# Patient Record
Sex: Female | Born: 1955 | Race: White | Hispanic: No | Smoking: Former smoker
Health system: Southern US, Community
[De-identification: ages and names within clinical notes are randomized; demographics above are authoritative.]

## PROBLEM LIST (undated history)

## (undated) DIAGNOSIS — H547 Unspecified visual loss: Secondary | ICD-10-CM

## (undated) DIAGNOSIS — H9190 Unspecified hearing loss, unspecified ear: Secondary | ICD-10-CM

## (undated) HISTORY — DX: Unspecified visual loss: H54.7

## (undated) HISTORY — DX: Unspecified hearing loss, unspecified ear: H91.90

## (undated) HISTORY — PX: COSMETIC SURGERY: SHX468

## (undated) HISTORY — PX: EYE SURGERY: SHX253

## (undated) HISTORY — PX: TUBAL LIGATION: SHX77

## (undated) HISTORY — PX: TONSILLECTOMY: SUR1361

---
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
## 1984-05-06 HISTORY — PX: ABDOMINAL HYSTERECTOMY: SHX81

## 1997-08-16 ENCOUNTER — Other Ambulatory Visit: Admission: RE | Admit: 1997-08-16 | Discharge: 1997-08-16 | Payer: Self-pay | Admitting: Family Medicine

## 1997-08-23 ENCOUNTER — Other Ambulatory Visit: Admission: RE | Admit: 1997-08-23 | Discharge: 1997-08-23 | Payer: Self-pay | Admitting: Family Medicine

## 2000-09-11 ENCOUNTER — Encounter: Admission: RE | Admit: 2000-09-11 | Discharge: 2000-09-11 | Payer: Self-pay | Admitting: Family Medicine

## 2000-09-11 ENCOUNTER — Encounter: Payer: Self-pay | Admitting: Family Medicine

## 2000-09-24 ENCOUNTER — Encounter: Admission: RE | Admit: 2000-09-24 | Discharge: 2000-09-24 | Payer: Self-pay | Admitting: Gastroenterology

## 2000-09-24 ENCOUNTER — Encounter: Payer: Self-pay | Admitting: Gastroenterology

## 2000-09-25 ENCOUNTER — Inpatient Hospital Stay (HOSPITAL_COMMUNITY): Admission: EM | Admit: 2000-09-25 | Discharge: 2000-10-03 | Payer: Self-pay | Admitting: Gastroenterology

## 2000-09-28 ENCOUNTER — Encounter: Payer: Self-pay | Admitting: Internal Medicine

## 2000-09-30 ENCOUNTER — Encounter: Payer: Self-pay | Admitting: Internal Medicine

## 2000-10-01 ENCOUNTER — Encounter: Payer: Self-pay | Admitting: Internal Medicine

## 2000-10-03 ENCOUNTER — Encounter: Payer: Self-pay | Admitting: Internal Medicine

## 2000-10-30 ENCOUNTER — Other Ambulatory Visit: Admission: RE | Admit: 2000-10-30 | Discharge: 2000-10-30 | Payer: Self-pay | Admitting: Gastroenterology

## 2000-11-24 ENCOUNTER — Encounter: Payer: Self-pay | Admitting: Gastroenterology

## 2000-11-24 ENCOUNTER — Ambulatory Visit (HOSPITAL_COMMUNITY): Admission: RE | Admit: 2000-11-24 | Discharge: 2000-11-24 | Payer: Self-pay | Admitting: Gastroenterology

## 2010-02-01 LAB — HM COLONOSCOPY

## 2011-10-30 DIAGNOSIS — K219 Gastro-esophageal reflux disease without esophagitis: Secondary | ICD-10-CM | POA: Diagnosis not present

## 2011-10-30 DIAGNOSIS — H543 Unqualified visual loss, both eyes: Secondary | ICD-10-CM | POA: Diagnosis not present

## 2011-10-30 DIAGNOSIS — H913 Deaf nonspeaking, not elsewhere classified: Secondary | ICD-10-CM | POA: Diagnosis not present

## 2011-10-30 DIAGNOSIS — Z0000 Encounter for general adult medical examination without abnormal findings: Secondary | ICD-10-CM | POA: Diagnosis not present

## 2011-11-11 DIAGNOSIS — Z1231 Encounter for screening mammogram for malignant neoplasm of breast: Secondary | ICD-10-CM | POA: Diagnosis not present

## 2011-11-11 DIAGNOSIS — R5381 Other malaise: Secondary | ICD-10-CM | POA: Diagnosis not present

## 2011-11-11 DIAGNOSIS — Z79899 Other long term (current) drug therapy: Secondary | ICD-10-CM | POA: Diagnosis not present

## 2011-11-11 DIAGNOSIS — H543 Unqualified visual loss, both eyes: Secondary | ICD-10-CM | POA: Diagnosis not present

## 2011-11-11 DIAGNOSIS — K219 Gastro-esophageal reflux disease without esophagitis: Secondary | ICD-10-CM | POA: Diagnosis not present

## 2011-11-11 DIAGNOSIS — R5383 Other fatigue: Secondary | ICD-10-CM | POA: Diagnosis not present

## 2011-11-11 LAB — TSH: TSH: 2.7 u[IU]/mL (ref 0.41–5.90)

## 2011-11-11 LAB — BASIC METABOLIC PANEL
BUN: 11 mg/dL (ref 4–21)
Creatinine: 0.7 mg/dL (ref 0.5–1.1)
Glucose: 80 mg/dL
Potassium: 4.7 mmol/L (ref 3.4–5.3)
Sodium: 140 mmol/L (ref 137–147)

## 2011-11-11 LAB — CBC AND DIFFERENTIAL
HCT: 36 % (ref 36–46)
Hemoglobin: 12.1 g/dL (ref 12.0–16.0)
Platelets: 272 10*3/uL (ref 150–399)
WBC: 4.6 10^3/mL

## 2011-11-11 LAB — HM MAMMOGRAPHY
HM Mammogram: NORMAL
HM Mammogram: NORMAL

## 2011-11-11 LAB — LIPID PANEL
Cholesterol: 197 mg/dL (ref 0–200)
HDL: 74 mg/dL — AB (ref 35–70)
LDL Cholesterol: 110 mg/dL
Triglycerides: 67 mg/dL (ref 40–160)

## 2011-11-11 LAB — HEPATIC FUNCTION PANEL
ALT: 19 U/L (ref 7–35)
AST: 34 U/L (ref 13–35)
Alkaline Phosphatase: 77 U/L (ref 25–125)
Bilirubin, Total: 0.4 mg/dL

## 2011-12-05 DIAGNOSIS — H543 Unqualified visual loss, both eyes: Secondary | ICD-10-CM | POA: Diagnosis not present

## 2011-12-05 DIAGNOSIS — K219 Gastro-esophageal reflux disease without esophagitis: Secondary | ICD-10-CM | POA: Diagnosis not present

## 2011-12-05 DIAGNOSIS — H913 Deaf nonspeaking, not elsewhere classified: Secondary | ICD-10-CM | POA: Diagnosis not present

## 2012-02-09 DIAGNOSIS — Z23 Encounter for immunization: Secondary | ICD-10-CM | POA: Diagnosis not present

## 2012-03-23 DIAGNOSIS — M79609 Pain in unspecified limb: Secondary | ICD-10-CM | POA: Diagnosis not present

## 2012-07-17 DIAGNOSIS — H3552 Pigmentary retinal dystrophy: Secondary | ICD-10-CM | POA: Diagnosis not present

## 2013-02-01 ENCOUNTER — Ambulatory Visit (INDEPENDENT_AMBULATORY_CARE_PROVIDER_SITE_OTHER): Payer: Medicare Other | Admitting: Internal Medicine

## 2013-02-01 ENCOUNTER — Encounter: Payer: Self-pay | Admitting: Internal Medicine

## 2013-02-01 VITALS — BP 118/70 | HR 72 | Temp 98.0°F | Resp 14 | Ht 63.0 in | Wt 164.5 lb

## 2013-02-01 DIAGNOSIS — K5901 Slow transit constipation: Secondary | ICD-10-CM

## 2013-02-01 DIAGNOSIS — Z1239 Encounter for other screening for malignant neoplasm of breast: Secondary | ICD-10-CM | POA: Diagnosis not present

## 2013-02-01 DIAGNOSIS — N760 Acute vaginitis: Secondary | ICD-10-CM

## 2013-02-01 DIAGNOSIS — E663 Overweight: Secondary | ICD-10-CM | POA: Diagnosis not present

## 2013-02-01 DIAGNOSIS — Z23 Encounter for immunization: Secondary | ICD-10-CM | POA: Diagnosis not present

## 2013-02-01 DIAGNOSIS — Z6825 Body mass index (BMI) 25.0-25.9, adult: Secondary | ICD-10-CM

## 2013-02-01 DIAGNOSIS — H9190 Unspecified hearing loss, unspecified ear: Secondary | ICD-10-CM

## 2013-02-01 DIAGNOSIS — H3552 Pigmentary retinal dystrophy: Secondary | ICD-10-CM | POA: Diagnosis not present
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
## 2013-02-01 NOTE — Patient Instructions (Addendum)
Please make an appt for fasting labs at your convenience  You received the flu and Tdap vaccines today ' We will set up your mammogram at [facility] today

## 2013-02-01 NOTE — Assessment & Plan Note (Signed)
Exam was normal.  Area of itching  Appeared hypopigimented and macular, may be a sebaceous cyst, not inflamed.

## 2013-02-01 NOTE — Assessment & Plan Note (Addendum)
Managed previously with miralax, now with a high fiber diet. Last colonoscopy may have been in 2011.

## 2013-02-01 NOTE — Assessment & Plan Note (Signed)
I have addressed  BMI and recommended wt loss of 10% of body weigh over the next 6 months using a low glycemic index diet and regular exercise a minimum of 5 days per week.   She will return for fasting labs in December

## 2013-02-01 NOTE — Assessment & Plan Note (Signed)
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  Uses sign language for communication

## 2013-02-05 ENCOUNTER — Telehealth: Payer: Self-pay | Admitting: Internal Medicine

## 2013-02-05 NOTE — Telephone Encounter (Signed)
Called Aurea Graff (mother) to find out pt previous primary care/where do we need to get previous records from?  Med Rec request received but w/o destination.

## 2013-04-13 ENCOUNTER — Ambulatory Visit: Payer: Self-pay | Admitting: Internal Medicine

## 2013-04-13 DIAGNOSIS — Z1231 Encounter for screening mammogram for malignant neoplasm of breast: Secondary | ICD-10-CM | POA: Diagnosis not present

## 2013-04-16 ENCOUNTER — Other Ambulatory Visit: Payer: Medicare Other

## 2013-04-16 ENCOUNTER — Telehealth: Payer: Self-pay | Admitting: *Deleted

## 2013-04-16 NOTE — Telephone Encounter (Signed)
Pt is coming in for labs 12.15.2014 what labs and dx?  

## 2013-04-19 ENCOUNTER — Telehealth: Payer: Self-pay | Admitting: *Deleted

## 2013-04-19 ENCOUNTER — Other Ambulatory Visit (INDEPENDENT_AMBULATORY_CARE_PROVIDER_SITE_OTHER): Payer: Medicare Other

## 2013-04-19 DIAGNOSIS — R5381 Other malaise: Secondary | ICD-10-CM | POA: Diagnosis not present

## 2013-04-19 DIAGNOSIS — E785 Hyperlipidemia, unspecified: Secondary | ICD-10-CM

## 2013-04-19 LAB — LIPID PANEL
HDL: 66.3 mg/dL (ref >39.00)
Total CHOL/HDL Ratio: 3
Triglycerides: 66 mg/dL (ref 0.0–149.0)
VLDL: 13.2 mg/dL (ref 0.0–40.0)

## 2013-04-19 LAB — COMPREHENSIVE METABOLIC PANEL
ALT: 16 U/L (ref 0–35)
Albumin: 4.3 g/dL (ref 3.5–5.2)
Alkaline Phosphatase: 74 U/L (ref 39–117)
Calcium: 9.6 mg/dL (ref 8.4–10.5)
Chloride: 105 mEq/L (ref 96–112)
GFR: 88.36 mL/min (ref >60.00)
Potassium: 4.8 mEq/L (ref 3.5–5.1)
Sodium: 142 mEq/L (ref 135–145)
Total Bilirubin: 0.7 mg/dL (ref 0.3–1.2)
Total Protein: 7.5 g/dL (ref 6.0–8.3)

## 2013-04-19 LAB — LDL CHOLESTEROL, DIRECT: Direct LDL: 128.1 mg/dL

## 2013-04-19 LAB — TSH: TSH: 5.13 u[IU]/mL (ref 0.35–5.50)

## 2013-04-19 NOTE — Telephone Encounter (Signed)
What labs and dx?  

## 2013-04-20 ENCOUNTER — Encounter: Payer: Self-pay | Admitting: *Deleted

## 2013-05-21 ENCOUNTER — Encounter: Payer: Self-pay | Admitting: Internal Medicine

## 2013-10-21 DIAGNOSIS — L280 Lichen simplex chronicus: Secondary | ICD-10-CM | POA: Diagnosis not present

## 2013-10-21 DIAGNOSIS — D485 Neoplasm of uncertain behavior of skin: Secondary | ICD-10-CM | POA: Diagnosis not present

## 2013-10-21 DIAGNOSIS — L820 Inflamed seborrheic keratosis: Secondary | ICD-10-CM | POA: Diagnosis not present

## 2013-10-25 ENCOUNTER — Encounter: Payer: Self-pay | Admitting: Internal Medicine

## 2013-10-25 ENCOUNTER — Ambulatory Visit (INDEPENDENT_AMBULATORY_CARE_PROVIDER_SITE_OTHER): Payer: Medicare Other | Admitting: Internal Medicine

## 2013-10-25 VITALS — BP 128/72 | HR 77 | Temp 97.7°F | Resp 16 | Ht 63.0 in | Wt 162.8 lb

## 2013-10-25 DIAGNOSIS — E663 Overweight: Secondary | ICD-10-CM | POA: Diagnosis not present

## 2013-10-25 DIAGNOSIS — K5901 Slow transit constipation: Secondary | ICD-10-CM

## 2013-10-25 DIAGNOSIS — G4762 Sleep related leg cramps: Secondary | ICD-10-CM | POA: Diagnosis not present

## 2013-10-25 DIAGNOSIS — E038 Other specified hypothyroidism: Secondary | ICD-10-CM | POA: Diagnosis not present

## 2013-10-25 DIAGNOSIS — E039 Hypothyroidism, unspecified: Secondary | ICD-10-CM

## 2013-10-25 LAB — BASIC METABOLIC PANEL
BUN: 13 mg/dL (ref 6–23)
CALCIUM: 9.3 mg/dL (ref 8.4–10.5)
CO2: 32 mEq/L (ref 19–32)
CREATININE: 0.8 mg/dL (ref 0.4–1.2)
Chloride: 103 mEq/L (ref 96–112)
GFR: 79.24 mL/min (ref >60.00)
Glucose, Bld: 82 mg/dL (ref 70–99)
Potassium: 4.3 mEq/L (ref 3.5–5.1)
SODIUM: 139 meq/L (ref 135–145)

## 2013-10-25 LAB — MAGNESIUM: Magnesium: 2.2 mg/dL (ref 1.5–2.5)

## 2013-10-25 NOTE — Patient Instructions (Signed)
Leg Cramps Leg cramps that occur during exercise can be caused by poor circulation or dehydration. However, muscle cramps that occur at rest or during the night are usually not due to any serious medical problem. Heat cramps may cause muscle spasms during hot weather.  CAUSES There is no clear cause for muscle cramps. However, dehydration may be a factor for those who do not drink enough fluids and those who exercise in the heat. Imbalances in the level of sodium, potassium, calcium or magnesium in the muscle tissue may also be a factor. Some medications, such as water pills (diuretics), may cause loss of chemicals that the body needs (like sodium and potassium) and cause muscle cramps. TREATMENT   Make sure your diet has enough fluids and essential minerals for the muscle to work normally.  Avoid strenuous exercise for several days if you have been having frequent leg cramps.  Stretch and massage the cramped muscle for several minutes.  Some medicines may be helpful in some patients with night cramps. Only take over-the-counter or prescription medicines as directed by your caregiver. SEEK IMMEDIATE MEDICAL CARE IF:   Your leg cramps become worse.  Your foot becomes cold, numb, or blue. Document Released: 05/30/2004 Document Revised: 07/15/2011 Document Reviewed: 05/17/2008 ExitCare Patient Information 2015 [employer]. This information is not intended to replace advice given to you by your health care Karem Tomaso. Make sure you discuss any questions you have with your health care [place].  

## 2013-10-25 NOTE — Progress Notes (Signed)
Patient ID: Helen Mccoy, female   DOB: July 05, 1955, 58 y.o.   MRN: [id number]  Patient Active Problem List   Diagnosis Date Noted  . Nocturnal leg cramps 10/26/2013  . 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 Overweight (BMI 25.0-29.9) 02/01/2013  . Constipation, slow transit 02/01/2013    Subjective:  CC:   Chief Complaint  Patient presents with  . Follow-up    # month on thyroid    HPI:   DAILY DOE is a 58 y.o. female who presents for follow up on borderline abnormal thyroid function noted during December evaluation   Patient is deaf/mute/blind secondary to Usher's syndrome..  Accompanied by mother   Cc: 1)  has been having nocturnal leg and foot cramps., including toes.  Does not wear high heels,  Take siuretics, or work outside,  Sedentary lifestyle due to disability.  2)  Chronic constipation managed with fiber supplememnt     Past Medical History  Diagnosis Date  . Blind     retinitis pigmentosia started at 58 years of age  . Deafness 9 /24/1956    bilateral    Past Surgical History  Procedure Laterality Date  . Tonsilectomy, adenoidectomy, bilateral myringotomy and tubes Bilateral 1968  . Abdominal hysterectomy  1986    nonmalignant reasons  . Cesarean section    . Cosmetic surgery    . Tubal ligation         The following portions of the patient's history were reviewed and updated as appropriate: Allergies, current medications, and problem list.    Review of Systems:   Patient denies headache, fevers, malaise, unintentional weight loss, skin rash, eye pain, sinus congestion and sinus pain, sore throat, dysphagia,  hemoptysis , cough, dyspnea, wheezing, chest pain, palpitations, orthopnea, edema, abdominal pain, nausea, melena, diarrhea, constipation, flank pain, dysuria, hematuria, urinary  Frequency, nocturia, numbness, tingling, seizures,  Focal weakness, Loss of consciousness,  Tremor, insomnia, depression, anxiety, and suicidal ideation.      History   Social History  . Marital Status: Divorced    Spouse Name: N/A    Number of Children: N/A  . Years of Education: N/A   Occupational History  . Not on file.   Social History Main Topics  . Smoking status: Former Smoker -- 1.00 packs/day    Types: Cigarettes    Quit date: 02/02/1999  . Smokeless tobacco: Not on file  . Alcohol Use: Yes     Comment: occasionally  . Drug Use: No  . Sexual Activity: Not Currently   Other Topics Concern  . Not on file   Social History Narrative  . No narrative on file    Objective:  Filed Vitals:   10/25/13 1106  BP: 128/72  Pulse: 77  Temp: 97.7 F (36.5 C)  Resp: 16     General appearance: alert, cooperative and appears stated age Ears: normal TM's and external ear canals both ears Throat: lips, mucosa, and tongue normal; teeth and gums normal Neck: no adenopathy, no carotid bruit, supple, symmetrical, trachea midline and thyroid not enlarged, symmetric, no tenderness/mass/nodules Back: symmetric, no curvature. ROM normal. No CVA tenderness. Lungs: clear to auscultation bilaterally Heart: regular rate and rhythm, S1, S2 normal, no murmur, click, rub or gallop Abdomen: soft, non-tender; bowel sounds normal; no masses,  no organomegaly Pulses: 2+ and symmetric Skin: Skin color, texture, turgor normal. No rashes or lesions Lymph nodes: Cervical, supraclavicular, and axillary nodes normal.  Assessment and Plan:  Constipation, slow transit Rechecking thyroid today given  borderline TSH in the past. TSH  is normal.  Advised to continue daily fiber laxative,  Confirm adequate fiver and water in diet.  Results of 2011 colonoscopy requested.  Lab Results  Component Value Date   TSH 2.130 10/25/2013     Nocturnal leg cramps Normal electrolyte screen,  Normal vascular exam.  Recommended stretching exercises and soaks in epsom salts.   Lab Results  Component Value Date   NA 139 10/25/2013   K 4.3 10/25/2013   CL 103  10/25/2013   CO2 32 10/25/2013     Overweight (BMI 25.0-29.9) I have addressed  BMI and recommended wt loss of 10% of body weigh over the next 6 months using a low glycemic index diet and regular exercise a minimum of 5 days per week. Thyroid function is normal,  No signs of diabetes    Updated Medication List No outpatient encounter prescriptions on file as of 10/25/2013.     Orders Placed This Encounter  Procedures  . T4 AND TSH  . Basic metabolic panel  . Magnesium    No Follow-up on file.

## 2013-10-25 NOTE — Progress Notes (Signed)
Pre-visit discussion using our clinic review tool. No additional management support is needed unless otherwise documented below in the visit note.  

## 2013-10-26 DIAGNOSIS — G4762 Sleep related leg cramps: Secondary | ICD-10-CM | POA: Insufficient documentation

## 2013-10-26 LAB — T4 AND TSH
T4 TOTAL: 7.6 ug/dL (ref 4.5–12.0)
TSH: 2.13 u[IU]/mL (ref 0.450–4.500)

## 2013-10-26 NOTE — Assessment & Plan Note (Addendum)
I have addressed  BMI and recommended wt loss of 10% of body weigh over the next 6 months using a low glycemic index diet and regular exercise a minimum of 5 days per week. Thyroid function is normal,  No signs of diabetes

## 2013-10-26 NOTE — Assessment & Plan Note (Signed)
Rechecking thyroid today given borderline TSH in the past. TSH  is normal.  Advised to continue daily fiber laxative,  Confirm adequate fiver and water in diet.  Results of 2011 colonoscopy requested.  Lab Results  Component Value Date   TSH 2.130 10/25/2013

## 2013-10-26 NOTE — Assessment & Plan Note (Signed)
Normal electrolyte screen,  Normal vascular exam.  Recommended stretching exercises and soaks in epsom salts.   Lab Results  Component Value Date   NA 139 10/25/2013   K 4.3 10/25/2013   CL 103 10/25/2013   CO2 32 10/25/2013

## 2013-10-28 ENCOUNTER — Encounter: Payer: Self-pay | Admitting: *Deleted

## 2014-01-13 ENCOUNTER — Other Ambulatory Visit: Payer: Self-pay | Admitting: Internal Medicine

## 2014-02-12 DIAGNOSIS — Z23 Encounter for immunization: Secondary | ICD-10-CM | POA: Diagnosis not present

## 2015-02-16 DIAGNOSIS — J069 Acute upper respiratory infection, unspecified: Secondary | ICD-10-CM | POA: Diagnosis not present

## 2015-02-16 DIAGNOSIS — R591 Generalized enlarged lymph nodes: Secondary | ICD-10-CM | POA: Diagnosis not present

## 2015-02-16 DIAGNOSIS — Z882 Allergy status to sulfonamides status: Secondary | ICD-10-CM | POA: Diagnosis not present

## 2015-02-16 DIAGNOSIS — H9190 Unspecified hearing loss, unspecified ear: Secondary | ICD-10-CM | POA: Diagnosis not present

## 2015-02-16 DIAGNOSIS — H540 Blindness, both eyes: Secondary | ICD-10-CM | POA: Diagnosis not present

## 2015-03-02 DIAGNOSIS — Z23 Encounter for immunization: Secondary | ICD-10-CM | POA: Diagnosis not present

## 2015-04-28 ENCOUNTER — Ambulatory Visit
Admission: RE | Admit: 2015-04-28 | Discharge: 2015-04-28 | Disposition: A | Discharge status: Home / Self Care | Payer: Medicare Other | Source: Ambulatory Visit | Attending: Internal Medicine | Admitting: Internal Medicine

## 2015-04-28 ENCOUNTER — Ambulatory Visit (INDEPENDENT_AMBULATORY_CARE_PROVIDER_SITE_OTHER): Payer: Medicare Other | Admitting: Internal Medicine

## 2015-04-28 ENCOUNTER — Encounter: Payer: Self-pay | Admitting: Internal Medicine

## 2015-04-28 VITALS — BP 108/68 | HR 70 | Temp 98.1°F | Resp 12 | Ht 63.0 in | Wt 158.1 lb

## 2015-04-28 DIAGNOSIS — Z113 Encounter for screening for infections with a predominantly sexual mode of transmission: Secondary | ICD-10-CM | POA: Diagnosis not present

## 2015-04-28 DIAGNOSIS — E559 Vitamin D deficiency, unspecified: Secondary | ICD-10-CM

## 2015-04-28 DIAGNOSIS — E663 Overweight: Secondary | ICD-10-CM | POA: Diagnosis not present

## 2015-04-28 DIAGNOSIS — R591 Generalized enlarged lymph nodes: Secondary | ICD-10-CM

## 2015-04-28 DIAGNOSIS — R5383 Other fatigue: Secondary | ICD-10-CM

## 2015-04-28 DIAGNOSIS — Z0000 Encounter for general adult medical examination without abnormal findings: Secondary | ICD-10-CM

## 2015-04-28 DIAGNOSIS — D649 Anemia, unspecified: Secondary | ICD-10-CM

## 2015-04-28 DIAGNOSIS — Z90710 Acquired absence of both cervix and uterus: Secondary | ICD-10-CM | POA: Diagnosis not present

## 2015-04-28 DIAGNOSIS — R599 Enlarged lymph nodes, unspecified: Secondary | ICD-10-CM

## 2015-04-28 DIAGNOSIS — E785 Hyperlipidemia, unspecified: Secondary | ICD-10-CM

## 2015-04-28 DIAGNOSIS — Z1239 Encounter for other screening for malignant neoplasm of breast: Secondary | ICD-10-CM

## 2015-04-28 DIAGNOSIS — R61 Generalized hyperhidrosis: Secondary | ICD-10-CM | POA: Diagnosis not present

## 2015-04-28 LAB — C-REACTIVE PROTEIN: CRP: 0.4 mg/dL — AB (ref 0.5–20.0)

## 2015-04-28 LAB — VITAMIN B12: Vitamin B-12: 352 pg/mL (ref 211–911)

## 2015-04-28 LAB — LIPID PANEL
CHOL/HDL RATIO: 3
Cholesterol: 219 mg/dL — ABNORMAL HIGH (ref 0–200)
HDL: 76.9 mg/dL (ref >39.00)
LDL Cholesterol: 130 mg/dL — ABNORMAL HIGH (ref 0–99)
NONHDL: 141.76
TRIGLYCERIDES: 59 mg/dL (ref 0.0–149.0)
VLDL: 11.8 mg/dL (ref 0.0–40.0)

## 2015-04-28 LAB — CBC WITH DIFFERENTIAL/PLATELET
BASOS ABS: 0 10*3/uL (ref 0.0–0.1)
BASOS PCT: 0.5 % (ref 0.0–3.0)
EOS ABS: 0.1 10*3/uL (ref 0.0–0.7)
Eosinophils Relative: 1.7 % (ref 0.0–5.0)
HEMATOCRIT: 38.7 % (ref 36.0–46.0)
Hemoglobin: 12.8 g/dL (ref 12.0–15.0)
LYMPHS ABS: 2.3 10*3/uL (ref 0.7–4.0)
LYMPHS PCT: 37.7 % (ref 12.0–46.0)
MCHC: 32.9 g/dL (ref 30.0–36.0)
MCV: 92.6 fl (ref 78.0–100.0)
Monocytes Absolute: 0.6 10*3/uL (ref 0.1–1.0)
Monocytes Relative: 9.7 % (ref 3.0–12.0)
NEUTROS ABS: 3.1 10*3/uL (ref 1.4–7.7)
NEUTROS PCT: 50.4 % (ref 43.0–77.0)
PLATELETS: 313 10*3/uL (ref 150.0–400.0)
RBC: 4.18 Mil/uL (ref 3.87–5.11)
RDW: 13 % (ref 11.5–15.5)
WBC: 6.1 10*3/uL (ref 4.0–10.5)

## 2015-04-28 LAB — COMPREHENSIVE METABOLIC PANEL
ALK PHOS: 83 U/L (ref 39–117)
ALT: 11 U/L (ref 0–35)
AST: 24 U/L (ref 0–37)
Albumin: 4.3 g/dL (ref 3.5–5.2)
BILIRUBIN TOTAL: 0.5 mg/dL (ref 0.2–1.2)
BUN: 13 mg/dL (ref 6–23)
CALCIUM: 9.8 mg/dL (ref 8.4–10.5)
CO2: 31 mEq/L (ref 19–32)
CREATININE: 0.71 mg/dL (ref 0.40–1.20)
Chloride: 104 mEq/L (ref 96–112)
GFR: 89.17 mL/min (ref >60.00)
Glucose, Bld: 88 mg/dL (ref 70–99)
Potassium: 4.4 mEq/L (ref 3.5–5.1)
SODIUM: 142 meq/L (ref 135–145)
TOTAL PROTEIN: 7.7 g/dL (ref 6.0–8.3)

## 2015-04-28 LAB — TSH: TSH: 1.52 u[IU]/mL (ref 0.35–4.50)

## 2015-04-28 LAB — IBC PANEL
IRON: 115 ug/dL (ref 42–145)
Saturation Ratios: 31.6 % (ref 20.0–50.0)
TRANSFERRIN: 260 mg/dL (ref 212.0–360.0)

## 2015-04-28 LAB — FERRITIN: Ferritin: 105.6 ng/mL (ref 10.0–291.0)

## 2015-04-28 LAB — LDL CHOLESTEROL, DIRECT: LDL DIRECT: 125 mg/dL

## 2015-04-28 LAB — VITAMIN D 25 HYDROXY (VIT D DEFICIENCY, FRACTURES): VITD: 20.48 ng/mL — ABNORMAL LOW (ref 30.00–100.00)

## 2015-04-28 NOTE — Progress Notes (Signed)
Pre-visit discussion using our clinic review tool. No additional management support is needed unless otherwise documented below in the visit note.  

## 2015-04-28 NOTE — Progress Notes (Signed)
Patient ID: Helen Mccoy, female    DOB: May 09, 1955  Age: 59 y.o. MRN: [id number]  The patient is here for annual  wellness examination and management of other chronic and acute problems. She is accomapneid by her mother and an interpretor as she is deaf and blind secondary  to Usher syndrome   The risk factors are reflected in the social history.  The roster of all physicians providing medical care to patient - is listed in the Snapshot section of the chart.  Activities of daily living:  The patient is 100% independent in all ADLs: dressing, toileting, feeding as well as independent mobility  Home safety : The patient has smoke detectors in the home. They wear seatbelts.  There are no firearms at home. There is no violence in the home.   There is no risks for hepatitis, STDs or HIV. There is no   history of blood transfusion. They have no travel history to infectious disease endemic areas of the world.  The patient has seen their dentist in the last six month. They have seen their eye doctor in the last year. They admit to slight hearing difficulty with regard to whispered voices and some television programs.  They have deferred audiologic testing in the last year.  They do not  have excessive sun exposure. Discussed the need for sun protection: hats, long sleeves and use of sunscreen if there is significant sun exposure.   Diet: the importance of a healthy diet is discussed. They do have a healthy diet.  The benefits of regular aerobic exercise were discussed. She does not exercise regularly but occasionally uses a treadmill or leg lifts, and is  willing to do what is recommended   Depression screen: there are no signs or vegative symptoms of depression- irritability, change in appetite, anhedonia, sadness/tearfullness.  Cognitive assessment: the patient manages all their financial and personal affairs and is actively engaged. They could relate day,date,year and events; recalled 2/3  objects at 3 minutes; performed clock-face test normally.  The following portions of the patient's history were reviewed and updated as appropriate: allergies, current medications, past family history, past medical history,  past surgical history, past social history  and problem list.  Visual acuity was not assessed per patient preference since she has regular follow up with her ophthalmologist. Hearing and body mass index were assessed and reviewed.   During the course of the visit the patient was educated and counseled about appropriate screening and preventive services including : fall prevention , diabetes screening, nutrition counseling, colorectal cancer screening, and recommended immunizations.    CC: The primary encounter diagnosis was S/P TAH (total abdominal hysterectomy). Diagnoses of Anemia, unspecified anemia type, Screen for STD (sexually transmitted disease), Vitamin D deficiency, Other fatigue, Lymphadenopathy, Hyperlipidemia, Breast cancer screening, Visit for preventive health examination, Overweight (BMI 25.0-29.9), and Lymphadenopathy of head and neck were also pertinent to this visit.   LAD:  She was recently evaluated in ER in October for LAD,  Which was attributed to a viral syndrome.  She continues to have Right sided LAD neck, persistent   post nasal drip .  She reports thick mucus which is difficult to clear at times.  She denies sinus pain,  and headaches,  And denies fevers. No unintentional weight loss,  No cough.    Does not take any antihistamines.  sneezes several times at night once she lies down to sleep,  But not throughout the day.  Home is carpeted,  Mother admits  it has not had a deep cleaning in a long time and not sure when the ducts were last cleaned.    Menopausal:   also having hot flashes at night.  Has been intentionally losing weight   History of nocturnal cough has resolved with better eating habits.  Occasionally occurs at night   Worried about a  lump on her right shin that occurred after falling and bruising leg in early November    Concerned that she is developing rounded shoulders.  Discussed exercises to improve posture and tone shoulder and arm muscles   History Helen Mccoy has a past medical history of Blind and Deafness (9 /24/1956).   She has past surgical history that includes Tonsilectomy, adenoidectomy, bilateral myringotomy and tubes (Bilateral, 1968); Abdominal hysterectomy (1986); Cesarean section; Cosmetic surgery; and Tubal ligation.   Her family history includes Arthritis in her mother; Cancer in her mother; Hypertension in her father.She reports that she quit smoking about 16 years ago. Her smoking use included Cigarettes. She smoked 1.00 pack per day. She does not have any smokeless tobacco history on file. She reports that she drinks alcohol. She reports that she does not use illicit drugs.  No outpatient prescriptions prior to visit.   No facility-administered medications prior to visit.    Review of Systems   Patient denies headache, fevers, malaise, unintentional weight loss, skin rash, eye pain, sinus congestion and sinus pain, sore throat, dysphagia,  hemoptysis , cough, dyspnea, wheezing, chest pain, palpitations, orthopnea, edema, abdominal pain, nausea, melena, diarrhea, constipation, flank pain, dysuria, hematuria, urinary  Frequency, nocturia, numbness, tingling, seizures,  Focal weakness, Loss of consciousness,  Tremor, insomnia, depression, anxiety, and suicidal ideation.      Objective:  BP 108/68 mmHg  Pulse 70  Temp(Src) 98.1 F (36.7 C) (Oral)  Resp 12  Ht 5\' 3"  (1.6 m)  Wt 158 lb 2 oz (71.725 kg)  BMI 28.02 kg/m2  SpO2 99%  Physical Exam  General appearance: alert, cooperative and appears stated age Head: Normocephalic, without obvious abnormality, atraumatic Eyes: conjunctivae/corneas clear. PERRL, EOM's intact. Fundi benign. Ears: normal TM's and external ear canals both ears Nose:  Nares normal. Septum midline. Mucosa normal. No drainage or sinus tenderness. Throat: lips, mucosa, and tongue normal; teeth and gums normal Neck: no adenopathy, no carotid bruit, no JVD, supple, symmetrical, trachea midline and thyroid not enlarged, symmetric, no tenderness/mass/nodules Lungs: clear to auscultation bilaterally Breasts: normal appearance, no masses or tenderness Heart: regular rate and rhythm, S1, S2 normal, no murmur, click, rub or gallop Abdomen: soft, non-tender; bowel sounds normal; no masses,  no organomegaly Extremities: extremities normal, atraumatic, no cyanosis or edema Pulses: 2+ and symmetric Skin: Skin color, texture, turgor normal. No rashes or lesions Neurologic: Alert and oriented X 3, normal strength and tone. Normal symmetric reflexes. Normal coordination and gait.    Assessment & Plan:   Problem List Items Addressed This Visit    Overweight (BMI 25.0-29.9)    I have congratulated her in reduction of   BMI and encouraged  Continued weight loss with goal of 10% of body weigh over the next 6 months using a low glycemic index diet and regular exercise a minimum of 5 days per week.        Visit for preventive health examination    Annual comprehensive preventive exam was done as well as an evaluation and management of acute and chronic conditions .  During the course of the visit the patient was educated and counseled about  appropriate screening and preventive services including :  diabetes screening, lipid analysis with projected  10 year  risk for CAD , nutrition counseling, colorectal cancer screening, and recommended immunizations.  Printed recommendations for health maintenance screenings was given.   Lab Results  Component Value Date   CHOL 219* 04/28/2015   HDL 76.90 04/28/2015   LDLCALC 130* 04/28/2015   LDLDIRECT 125.0 04/28/2015   TRIG 59.0 04/28/2015   CHOLHDL 3 04/28/2015   Lab Results  Component Value Date   TSH 1.52 04/28/2015   Lab  Results  Component Value Date   CREATININE 0.71 04/28/2015   Lab Results  Component Value Date   ALT 11 04/28/2015   AST 24 04/28/2015   ALKPHOS 83 04/28/2015   BILITOT 0.5 04/28/2015   Lab Results  Component Value Date   NA 142 04/28/2015   K 4.4 04/28/2015   CL 104 04/28/2015   CO2 31 04/28/2015         Lymphadenopathy of head and neck    Right sided neck nodes are mildly enlarged, mobile and tender,  HEENT exam is normal but she is endorsing allergic symptoms vs viral.  She is very concerned that something has been missed.  Plain chest x ray and BC has been ordered and both are normal.   Lab Results  Component Value Date   WBC 6.1 04/28/2015   HGB 12.8 04/28/2015   HCT 38.7 04/28/2015   MCV 92.6 04/28/2015   PLT 313.0 04/28/2015         S/P TAH (total abdominal hysterectomy) - Primary    Other Visit Diagnoses    Anemia, unspecified anemia type        Relevant Orders    CBC with Differential/Platelet (Completed)    IBC panel (Completed)    Ferritin (Completed)    Vitamin B12 (Completed)    Screen for STD (sexually transmitted disease)        Relevant Orders    Hepatitis C antibody (Completed)    HIV antibody (Completed)    Vitamin D deficiency        Relevant Orders    VITAMIN D 25 Hydroxy (Vit-D Deficiency, Fractures) (Completed)    Other fatigue        Relevant Orders    Comprehensive metabolic panel (Completed)    TSH (Completed)    Hyperlipidemia        Relevant Orders    LDL cholesterol, direct (Completed)    Lipid panel (Completed)    Breast cancer screening        Relevant Orders    MM DIGITAL SCREENING BILATERAL      A total of 45 minutes was spent with patient more than half of which was spent in counseling patient on the above mentioned issues , reviewing and explaining recent labs and imaging studies done, and coordination of care.  Helen Mccoy does not currently have medications on file.  No orders of the defined types were placed in  this encounter.    There are no discontinued medications.  Follow-up: No Follow-up on file.   Crecencio Mc, MD

## 2015-04-28 NOTE — Patient Instructions (Addendum)
I recommend trying Benadryl at night to help the post nasal drip and the night time sneezing   Get your ducts evaluated to makes sure they are not full   I also recommend flushing sinuses in the morning with Salt water rinse to clear out the secretions  Would help  Chest x ray today  We will order the mammogram an you can set  It up  At [place] should start exercising 3 times a week on the treadmill or bike or both  (30 minutes nonstop ) and work your way up to 5 times per week  The shoulder exercises can be done with larger weights if tolerated and done every other day.   Menopause is a normal process in which your reproductive ability comes to an end. This process happens gradually over a span of months to years, usually between the ages of 29 and 67. Menopause is complete when you have missed 12 consecutive menstrual periods. It is important to talk with your health care provider about some of the most common conditions that affect postmenopausal women, such as heart disease, cancer, and bone loss (osteoporosis). Adopting a healthy lifestyle and getting preventive care can help to promote your health and wellness. Those actions can also lower your chances of developing some of these common conditions. WHAT SHOULD I KNOW ABOUT MENOPAUSE? During menopause, you may experience a number of symptoms, such as:  Moderate-to-severe hot flashes.  Night sweats.  Decrease in sex drive.  Mood swings.  Headaches.  Tiredness.  Irritability.  Memory problems.  Insomnia. Choosing to treat or not to treat menopausal changes is an individual decision that you make with your health care provider. WHAT SHOULD I KNOW ABOUT HORMONE REPLACEMENT THERAPY AND SUPPLEMENTS? Hormone therapy products are effective for treating symptoms that are associated with menopause, such as hot flashes and night sweats. Hormone replacement carries certain risks, especially as you become older. If you are thinking  about using estrogen or estrogen with progestin treatments, discuss the benefits and risks with your health care provider. WHAT SHOULD I KNOW ABOUT HEART DISEASE AND STROKE? Heart disease, heart attack, and stroke become more likely as you age. This may be due, in part, to the hormonal changes that your body experiences during menopause. These can affect how your body processes dietary fats, triglycerides, and cholesterol. Heart attack and stroke are both medical emergencies. There are many things that you can do to help prevent heart disease and stroke:  Have your blood pressure checked at least every 1-2 years. High blood pressure causes heart disease and increases the risk of stroke.  If you are 31-99 years old, ask your health care provider if you should take aspirin to prevent a heart attack or a stroke.  Do not use any tobacco products, including cigarettes, chewing tobacco, or electronic cigarettes. If you need help quitting, ask your health care provider.  It is important to eat a healthy diet and maintain a healthy weight.  Be sure to include plenty of vegetables, fruits, low-fat dairy products, and lean protein.  Avoid eating foods that are high in solid fats, added sugars, or salt (sodium).  Get regular exercise. This is one of the most important things that you can do for your health.  Try to exercise for at least 150 minutes each week. The type of exercise that you do should increase your heart rate and make you sweat. This is known as moderate-intensity exercise.  Try to do strengthening  exercises at least twice each week. Do these in addition to the moderate-intensity exercise.  Know your numbers.Ask your health care provider to check your cholesterol and your blood glucose. Continue to have your blood tested as directed by your health care provider. WHAT SHOULD I KNOW ABOUT CANCER SCREENING? There are several types of cancer. Take the following steps to reduce your risk and  to catch any cancer development as early as possible. Breast Cancer  Practice breast self-awareness.  This means understanding how your breasts normally appear and feel.  It also means doing regular breast self-exams. Let your health care provider know about any changes, no matter how small.  If you are 86 or older, have a clinician do a breast exam (clinical breast exam or CBE) every year. Depending on your age, family history, and medical history, it may be recommended that you also have a yearly breast X-ray (mammogram).  If you have a family history of breast cancer, talk with your health care provider about genetic screening.  If you are at high risk for breast cancer, talk with your health care provider about having an MRI and a mammogram every year.  Breast cancer (BRCA) gene test is recommended for women who have family members with BRCA-related cancers. Results of the assessment will determine the need for genetic counseling and BRCA1 and for BRCA2 testing. BRCA-related cancers include these types:  Breast. This occurs in males or females.  Ovarian.  Tubal. This may also be called fallopian tube cancer.  Cancer of the abdominal or pelvic lining (peritoneal cancer).  Prostate.  Pancreatic. Cervical, Uterine, and Ovarian Cancer Your health care provider may recommend that you be screened regularly for cancer of the pelvic organs. These include your ovaries, uterus, and vagina. This screening involves a pelvic exam, which includes checking for microscopic changes to the surface of your cervix (Pap test).  For women ages 21-65, health care providers may recommend a pelvic exam and a Pap test every three years. For women ages 61-65, they may recommend the Pap test and pelvic exam, combined with testing for human papilloma virus (HPV), every five years. Some types of HPV increase your risk of cervical cancer. Testing for HPV may also be done on women of any age who have unclear Pap  test results.  Other health care providers may not recommend any screening for nonpregnant women who are considered low risk for pelvic cancer and have no symptoms. Ask your health care provider if a screening pelvic exam is right for you.  If you have had past treatment for cervical cancer or a condition that could lead to cancer, you need Pap tests and screening for cancer for at least 20 years after your treatment. If Pap tests have been discontinued for you, your risk factors (such as having a new sexual partner) need to be reassessed to determine if you should start having screenings again. Some women have medical problems that increase the chance of getting cervical cancer. In these cases, your health care provider may recommend that you have screening and Pap tests more often.  If you have a family history of uterine cancer or ovarian cancer, talk with your health care provider about genetic screening.  If you have vaginal bleeding after reaching menopause, tell your health care provider.  There are currently no reliable tests available to screen for ovarian cancer. Lung Cancer Lung cancer screening is recommended for adults 42-69 years old who are at high risk for lung cancer because  of a history of smoking. A yearly low-dose CT scan of the lungs is recommended if you:  Currently smoke.  Have a history of at least 30 pack-years of smoking and you currently smoke or have quit within the past 15 years. A pack-year is smoking an average of one pack of cigarettes per day for one year. Yearly screening should:  Continue until it has been 15 years since you quit.  Stop if you develop a health problem that would prevent you from having lung cancer treatment. Colorectal Cancer  This type of cancer can be detected and can often be prevented.  Routine colorectal cancer screening usually begins at age 64 and continues through age 34.  If you have risk factors for colon cancer, your health  care provider may recommend that you be screened at an earlier age.  If you have a family history of colorectal cancer, talk with your health care provider about genetic screening.  Your health care provider may also recommend using home test kits to check for hidden blood in your stool.  A small camera at the end of a tube can be used to examine your colon directly (sigmoidoscopy or colonoscopy). This is done to check for the earliest forms of colorectal cancer.  Direct examination of the colon should be repeated every 5-10 years until age 25. However, if early forms of precancerous polyps or small growths are found or if you have a family history or genetic risk for colorectal cancer, you may need to be screened more often. Skin Cancer  Check your skin from head to toe regularly.  Monitor any moles. Be sure to tell your health care provider:  About any new moles or changes in moles, especially if there is a change in a mole's shape or color.  If you have a mole that is larger than the size of a pencil eraser.  If any of your family members has a history of skin cancer, especially at a young age, talk with your health care provider about genetic screening.  Always use sunscreen. Apply sunscreen liberally and repeatedly throughout the day.  Whenever you are outside, protect yourself by wearing long sleeves, pants, a wide-brimmed hat, and sunglasses. WHAT SHOULD I KNOW ABOUT OSTEOPOROSIS? Osteoporosis is a condition in which bone destruction happens more quickly than new bone creation. After menopause, you may be at an increased risk for osteoporosis. To help prevent osteoporosis or the bone fractures that can happen because of osteoporosis, the following is recommended:  If you are 35-10 years old, get at least 1,000 mg of calcium and at least 600 mg of vitamin D per day.  If you are older than age 77 but younger than age 33, get at least 1,200 mg of calcium and at least 600 mg of  vitamin D per day.  If you are older than age 23, get at least 1,200 mg of calcium and at least 800 mg of vitamin D per day. Smoking and excessive alcohol intake increase the risk of osteoporosis. Eat foods that are rich in calcium and vitamin D, and do weight-bearing exercises several times each week as directed by your health care provider. WHAT SHOULD I KNOW ABOUT HOW MENOPAUSE AFFECTS [place]? Depression may occur at any age, but it is more common as you become older. Common symptoms of depression include:  Low or sad mood.  Changes in sleep patterns.  Changes in appetite or eating patterns.  Feeling an overall lack of motivation  or enjoyment of activities that you previously enjoyed.  Frequent crying spells. Talk with your health care provider if you think that you are experiencing depression. WHAT SHOULD I KNOW ABOUT IMMUNIZATIONS? It is important that you get and maintain your immunizations. These include:  Tetanus, diphtheria, and pertussis (Tdap) booster vaccine.  Influenza every year before the flu season begins.  Pneumonia vaccine.  Shingles vaccine. Your health care provider may also recommend other immunizations.   This information is not intended to replace advice given to you by your health care provider. Make sure you discuss any questions you have with your health care provider.   Document Released: 06/14/2005 Document Revised: 05/13/2014 Document Reviewed: 12/23/2013 Elsevier Interactive Patient Education [employer].

## 2015-04-29 LAB — HEPATITIS C ANTIBODY: HCV Ab: NEGATIVE

## 2015-04-29 LAB — HIV ANTIBODY (ROUTINE TESTING W REFLEX): HIV: NONREACTIVE

## 2015-04-30 ENCOUNTER — Encounter: Payer: Self-pay | Admitting: Internal Medicine

## 2015-04-30 DIAGNOSIS — R591 Generalized enlarged lymph nodes: Secondary | ICD-10-CM | POA: Insufficient documentation

## 2015-04-30 DIAGNOSIS — Z0000 Encounter for general adult medical examination without abnormal findings: Secondary | ICD-10-CM | POA: Insufficient documentation

## 2015-04-30 NOTE — Assessment & Plan Note (Signed)
Right sided neck nodes are mildly enlarged, mobile and tender,  HEENT exam is normal but she is endorsing allergic symptoms vs viral.  She is very concerned that something has been missed.  Plain chest x ray and BC has been ordered and both are normal.   Lab Results  Component Value Date   WBC 6.1 04/28/2015   HGB 12.8 04/28/2015   HCT 38.7 04/28/2015   MCV 92.6 04/28/2015   PLT 313.0 04/28/2015

## 2015-04-30 NOTE — Assessment & Plan Note (Signed)
I have congratulated her in reduction of   BMI and encouraged  Continued weight loss with goal of 10% of body weigh over the next 6 months using a low glycemic index diet and regular exercise a minimum of 5 days per week.    

## 2015-04-30 NOTE — Assessment & Plan Note (Addendum)
Annual comprehensive preventive exam was done as well as an evaluation and management of acute and chronic conditions .  During the course of the visit the patient was educated and counseled about appropriate screening and preventive services including :  diabetes screening, lipid analysis with projected  10 year  risk for CAD , nutrition counseling, colorectal cancer screening, and recommended immunizations.  Printed recommendations for health maintenance screenings was given.   Lab Results  Component Value Date   CHOL 219* 04/28/2015   HDL 76.90 04/28/2015   LDLCALC 130* 04/28/2015   LDLDIRECT 125.0 04/28/2015   TRIG 59.0 04/28/2015   CHOLHDL 3 04/28/2015   Lab Results  Component Value Date   TSH 1.52 04/28/2015   Lab Results  Component Value Date   CREATININE 0.71 04/28/2015   Lab Results  Component Value Date   ALT 11 04/28/2015   AST 24 04/28/2015   ALKPHOS 83 04/28/2015   BILITOT 0.5 04/28/2015   Lab Results  Component Value Date   NA 142 04/28/2015   K 4.4 04/28/2015   CL 104 04/28/2015   CO2 31 04/28/2015

## 2015-05-01 ENCOUNTER — Other Ambulatory Visit: Payer: Self-pay | Admitting: Internal Medicine

## 2015-05-01 MED ORDER — ERGOCALCIFEROL 1.25 MG (50000 UT) PO CAPS: 50000.0000 [IU] | ORAL_CAPSULE | ORAL | Status: DC

## 2015-05-01 NOTE — Addendum Note (Signed)
Addended by: Crecencio Mc on: 05/01/2015 12:26 PM   Modules accepted: Miquel Dunn

## 2015-05-02 ENCOUNTER — Encounter: Payer: Self-pay | Admitting: *Deleted

## 2015-06-13 ENCOUNTER — Ambulatory Visit
Admission: RE | Admit: 2015-06-13 | Discharge: 2015-06-13 | Disposition: A | Discharge status: Home / Self Care | Payer: Medicare Other | Source: Ambulatory Visit | Attending: Internal Medicine | Admitting: Internal Medicine

## 2015-06-13 ENCOUNTER — Other Ambulatory Visit: Payer: Self-pay | Admitting: Internal Medicine

## 2015-06-13 DIAGNOSIS — Z1231 Encounter for screening mammogram for malignant neoplasm of breast: Secondary | ICD-10-CM

## 2015-06-13 DIAGNOSIS — Z1239 Encounter for other screening for malignant neoplasm of breast: Secondary | ICD-10-CM

## 2015-06-14 DIAGNOSIS — H3552 Pigmentary retinal dystrophy: Secondary | ICD-10-CM | POA: Diagnosis not present

## 2015-08-14 ENCOUNTER — Telehealth: Payer: Self-pay | Admitting: Internal Medicine

## 2015-08-14 NOTE — Telephone Encounter (Signed)
Pt's mother dropped off a renewal handicap application to have Dr. Derrel Nip Sign. Paper work is in Dr. Lupita Dawn box.

## 2015-08-15 NOTE — Telephone Encounter (Signed)
Notified patient mother form completed and ready for pick up.

## 2015-11-21 ENCOUNTER — Telehealth: Payer: Self-pay | Admitting: Internal Medicine

## 2015-11-21 NOTE — Telephone Encounter (Signed)
Please call patient's mother Remo Lipps to set up Hepatitis A/B vaccine on August 4th in preparation for a cruise to [place].  Patient is deaf and mute.

## 2015-11-22 NOTE — Telephone Encounter (Signed)
Attempted to call Remo Lipps again, no answer, not able to leave a message.

## 2015-11-22 NOTE — Telephone Encounter (Signed)
Attempted to reach Helen Mccoy on the number provided in the chart, no answer, kept ringing.  Called the patient's home and left a voice mail for them to return my call.

## 2016-02-22 DIAGNOSIS — Z23 Encounter for immunization: Secondary | ICD-10-CM | POA: Diagnosis not present

## 2016-03-06 ENCOUNTER — Telehealth: Payer: Self-pay | Admitting: Internal Medicine

## 2016-03-06 NOTE — Telephone Encounter (Signed)
I called pt and left a vm to call office and sch AWV. Thank you!

## 2016-05-01 ENCOUNTER — Ambulatory Visit (INDEPENDENT_AMBULATORY_CARE_PROVIDER_SITE_OTHER): Payer: Medicare Other

## 2016-05-01 ENCOUNTER — Encounter: Payer: Self-pay | Admitting: Internal Medicine

## 2016-05-01 ENCOUNTER — Ambulatory Visit (INDEPENDENT_AMBULATORY_CARE_PROVIDER_SITE_OTHER): Payer: Medicare Other | Admitting: Internal Medicine

## 2016-05-01 VITALS — BP 100/60 | HR 64 | Temp 98.1°F | Resp 16 | Ht 62.25 in | Wt 154.8 lb

## 2016-05-01 DIAGNOSIS — E559 Vitamin D deficiency, unspecified: Secondary | ICD-10-CM | POA: Diagnosis not present

## 2016-05-01 DIAGNOSIS — Z0000 Encounter for general adult medical examination without abnormal findings: Secondary | ICD-10-CM | POA: Diagnosis not present

## 2016-05-01 DIAGNOSIS — E7800 Pure hypercholesterolemia, unspecified: Secondary | ICD-10-CM | POA: Diagnosis not present

## 2016-05-01 DIAGNOSIS — R5383 Other fatigue: Secondary | ICD-10-CM | POA: Diagnosis not present

## 2016-05-01 DIAGNOSIS — R200 Anesthesia of skin: Secondary | ICD-10-CM | POA: Insufficient documentation

## 2016-05-01 DIAGNOSIS — Z1239 Encounter for other screening for malignant neoplasm of breast: Secondary | ICD-10-CM

## 2016-05-01 DIAGNOSIS — Z1231 Encounter for screening mammogram for malignant neoplasm of breast: Secondary | ICD-10-CM

## 2016-05-01 LAB — CBC WITH DIFFERENTIAL/PLATELET
BASOS PCT: 0.5 % (ref 0.0–3.0)
Basophils Absolute: 0 10*3/uL (ref 0.0–0.1)
EOS PCT: 2.1 % (ref 0.0–5.0)
Eosinophils Absolute: 0.1 10*3/uL (ref 0.0–0.7)
HEMATOCRIT: 36.3 % (ref 36.0–46.0)
Hemoglobin: 12.6 g/dL (ref 12.0–15.0)
LYMPHS PCT: 40.4 % (ref 12.0–46.0)
Lymphs Abs: 2.5 10*3/uL (ref 0.7–4.0)
MCHC: 34.6 g/dL (ref 30.0–36.0)
MCV: 91.1 fl (ref 78.0–100.0)
MONOS PCT: 8.3 % (ref 3.0–12.0)
Monocytes Absolute: 0.5 10*3/uL (ref 0.1–1.0)
NEUTROS ABS: 3 10*3/uL (ref 1.4–7.7)
Neutrophils Relative %: 48.7 % (ref 43.0–77.0)
PLATELETS: 311 10*3/uL (ref 150.0–400.0)
RBC: 3.98 Mil/uL (ref 3.87–5.11)
RDW: 13.2 % (ref 11.5–15.5)
WBC: 6.1 10*3/uL (ref 4.0–10.5)

## 2016-05-01 LAB — LIPID PANEL
CHOL/HDL RATIO: 3
Cholesterol: 219 mg/dL — ABNORMAL HIGH (ref 0–200)
HDL: 82.7 mg/dL (ref >39.00)
LDL Cholesterol: 122 mg/dL — ABNORMAL HIGH (ref 0–99)
NONHDL: 136.39
Triglycerides: 70 mg/dL (ref 0.0–149.0)
VLDL: 14 mg/dL (ref 0.0–40.0)

## 2016-05-01 LAB — TSH: TSH: 3.03 u[IU]/mL (ref 0.35–4.50)

## 2016-05-01 LAB — COMPREHENSIVE METABOLIC PANEL
ALT: 14 U/L (ref 0–35)
AST: 26 U/L (ref 0–37)
Albumin: 4.5 g/dL (ref 3.5–5.2)
Alkaline Phosphatase: 73 U/L (ref 39–117)
BUN: 13 mg/dL (ref 6–23)
CO2: 32 mEq/L (ref 19–32)
Calcium: 9.7 mg/dL (ref 8.4–10.5)
Chloride: 103 mEq/L (ref 96–112)
Creatinine, Ser: 0.67 mg/dL (ref 0.40–1.20)
GFR: 95.02 mL/min (ref >60.00)
Glucose, Bld: 86 mg/dL (ref 70–99)
Potassium: 4.7 mEq/L (ref 3.5–5.1)
Sodium: 142 mEq/L (ref 135–145)
Total Bilirubin: 0.5 mg/dL (ref 0.2–1.2)
Total Protein: 7.6 g/dL (ref 6.0–8.3)

## 2016-05-01 LAB — VITAMIN B12: Vitamin B-12: 377 pg/mL (ref 211–911)

## 2016-05-01 LAB — VITAMIN D 25 HYDROXY (VIT D DEFICIENCY, FRACTURES): VITD: 30.91 ng/mL (ref 30.00–100.00)

## 2016-05-01 NOTE — Assessment & Plan Note (Signed)
Annual comprehensive preventive exam was done as well as an evaluation and management of chronic conditions .  During the course of the visit the patient was educated and counseled about appropriate screening and preventive services including :  diabetes screening, lipid analysis with projected  10 year  risk for CAD , nutrition counseling, breast, cervical and colorectal cancer screening, and recommended immunizations.  Printed recommendations for health maintenance screenings was given 

## 2016-05-01 NOTE — Patient Instructions (Addendum)
Numbness in the hands can occur from Carpal Tunnel syndrome (median nerve entrapment in the wrist) or from degenerative changes in the cervical spine (neck area)  I am ordering plain films today of your  spine  To  see if degenerative changes are suggested.    Health Maintenance, Female Introduction Adopting a healthy lifestyle and getting preventive care can go a long way to promote health and wellness. Talk with your health care provider about what schedule of regular examinations is right for you. This is a good chance for you to check in with your provider about disease prevention and staying healthy. In between checkups, there are plenty of things you can do on your own. Experts have done a lot of research about which lifestyle changes and preventive measures are most likely to keep you healthy. Ask your health care provider for more information. Weight and diet Eat a healthy diet  Be sure to include plenty of vegetables, fruits, low-fat dairy products, and lean protein.  Do not eat a lot of foods high in solid fats, added sugars, or salt.  Get regular exercise. This is one of the most important things you can do for your health.  Most adults should exercise for at least 150 minutes each week. The exercise should increase your heart rate and make you sweat (moderate-intensity exercise).  Most adults should also do strengthening exercises at least twice a week. This is in addition to the moderate-intensity exercise. Maintain a healthy weight  Body mass index (BMI) is a measurement that can be used to identify possible weight problems. It estimates body fat based on height and weight. Your health care provider can help determine your BMI and help you achieve or maintain a healthy weight.  For females 61 years of age and older:  A BMI below 18.5 is considered underweight.  A BMI of 18.5 to 24.9 is normal.  A BMI of 25 to 29.9 is considered overweight.  A BMI of 30 and above is  considered obese. Watch levels of cholesterol and blood lipids  You should start having your blood tested for lipids and cholesterol at 61 years of age, then have this test every 5 years.  You may need to have your cholesterol levels checked more often if:  Your lipid or cholesterol levels are high.  You are older than 61 years of age.  You are at high risk for heart disease. Cancer screening Lung Cancer  Lung cancer screening is recommended for adults 69-67 years old who are at high risk for lung cancer because of a history of smoking.  A yearly low-dose CT scan of the lungs is recommended for people who:  Currently smoke.  Have quit within the past 15 years.  Have at least a 30-pack-year history of smoking. A pack year is smoking an average of one pack of cigarettes a day for 1 year.  Yearly screening should continue until it has been 15 years since you quit.  Yearly screening should stop if you develop a health problem that would prevent you from having lung cancer treatment. Breast Cancer  Practice breast self-awareness. This means understanding how your breasts normally appear and feel.  It also means doing regular breast self-exams. Let your health care provider know about any changes, no matter how small.  If you are in your 20s or 30s, you should have a clinical breast exam (CBE) by a health care provider every 1-3 years as part of a regular health exam.  If you are 40 or older, have a CBE every year. Also consider having a breast X-ray (mammogram) every year.  If you have a family history of breast cancer, talk to your health care provider about genetic screening.  If you are at high risk for breast cancer, talk to your health care provider about having an MRI and a mammogram every year.  Breast cancer gene (BRCA) assessment is recommended for women who have family members with BRCA-related cancers. BRCA-related cancers  include:  Breast.  Ovarian.  Tubal.  Peritoneal cancers.  Results of the assessment will determine the need for genetic counseling and BRCA1 and BRCA2 testing. Cervical Cancer  Your health care provider may recommend that you be screened regularly for cancer of the pelvic organs (ovaries, uterus, and vagina). This screening involves a pelvic examination, including checking for microscopic changes to the surface of your cervix (Pap test). You may be encouraged to have this screening done every 3 years, beginning at age 20.  For women ages 9-65, health care providers may recommend pelvic exams and Pap testing every 3 years, or they may recommend the Pap and pelvic exam, combined with testing for human papilloma virus (HPV), every 5 years. Some types of HPV increase your risk of cervical cancer. Testing for HPV may also be done on women of any age with unclear Pap test results.  Other health care providers may not recommend any screening for nonpregnant women who are considered low risk for pelvic cancer and who do not have symptoms. Ask your health care provider if a screening pelvic exam is right for you.  If you have had past treatment for cervical cancer or a condition that could lead to cancer, you need Pap tests and screening for cancer for at least 20 years after your treatment. If Pap tests have been discontinued, your risk factors (such as having a new sexual partner) need to be reassessed to determine if screening should resume. Some women have medical problems that increase the chance of getting cervical cancer. In these cases, your health care provider may recommend more frequent screening and Pap tests. Colorectal Cancer  This type of cancer can be detected and often prevented.  Routine colorectal cancer screening usually begins at 61 years of age and continues through 61 years of age.  Your health care provider may recommend screening at an earlier age if you have risk factors  for colon cancer.  Your health care provider may also recommend using home test kits to check for hidden blood in the stool.  A small camera at the end of a tube can be used to examine your colon directly (sigmoidoscopy or colonoscopy). This is done to check for the earliest forms of colorectal cancer.  Routine screening usually begins at age 88.  Direct examination of the colon should be repeated every 5-10 years through 61 years of age. However, you may need to be screened more often if early forms of precancerous polyps or small growths are found. Skin Cancer  Check your skin from head to toe regularly.  Tell your health care provider about any new moles or changes in moles, especially if there is a change in a mole's shape or color.  Also tell your health care provider if you have a mole that is larger than the size of a pencil eraser.  Always use sunscreen. Apply sunscreen liberally and repeatedly throughout the day.  Protect yourself by wearing long sleeves, pants, a wide-brimmed hat, and sunglasses whenever  you are outside. Heart disease, diabetes, and high blood pressure  High blood pressure causes heart disease and increases the risk of stroke. High blood pressure is more likely to develop in:  People who have blood pressure in the high end of the normal range (130-139/85-89 mm Hg).  People who are overweight or obese.  People who are African American.  If you are 56-57 years of age, have your blood pressure checked every 3-5 years. If you are 70 years of age or older, have your blood pressure checked every year. You should have your blood pressure measured twice-once when you are at a hospital or clinic, and once when you are not at a hospital or clinic. Record the average of the two measurements. To check your blood pressure when you are not at a hospital or clinic, you can use:  An automated blood pressure machine at a pharmacy.  A home blood pressure monitor.  If you  are between 70 years and 27 years old, ask your health care provider if you should take aspirin to prevent strokes.  Have regular diabetes screenings. This involves taking a blood sample to check your fasting blood sugar level.  If you are at a normal weight and have a low risk for diabetes, have this test once every three years after 61 years of age.  If you are overweight and have a high risk for diabetes, consider being tested at a younger age or more often. Preventing infection Hepatitis B  If you have a higher risk for hepatitis B, you should be screened for this virus. You are considered at high risk for hepatitis B if:  You were born in a country where hepatitis B is common. Ask your health care provider which countries are considered high risk.  Your parents were born in a high-risk country, and you have not been immunized against hepatitis B (hepatitis B vaccine).  You have HIV or AIDS.  You use needles to inject street drugs.  You live with someone who has hepatitis B.  You have had sex with someone who has hepatitis B.  You get hemodialysis treatment.  You take certain medicines for conditions, including cancer, organ transplantation, and autoimmune conditions. Hepatitis C  Blood testing is recommended for:  Everyone born from [birth year] through 1965.  Anyone with known risk factors for hepatitis C. Sexually transmitted infections (STIs)  You should be screened for sexually transmitted infections (STIs) including gonorrhea and chlamydia if:  You are sexually active and are younger than 61 years of age.  You are older than 61 years of age and your health care provider tells you that you are at risk for this type of infection.  Your sexual activity has changed since you were last screened and you are at an increased risk for chlamydia or gonorrhea. Ask your health care provider if you are at risk.  If you do not have HIV, but are at risk, it may be recommended that you  take a prescription medicine daily to prevent HIV infection. This is called pre-exposure prophylaxis (PrEP). You are considered at risk if:  You are sexually active and do not regularly use condoms or know the HIV status of your partner(s).  You take drugs by injection.  You are sexually active with a partner who has HIV. Talk with your health care provider about whether you are at high risk of being infected with HIV. If you choose to begin PrEP, you should first be tested for HIV.  You should then be tested every 3 months for as long as you are taking PrEP. Pregnancy  If you are premenopausal and you may become pregnant, ask your health care provider about preconception counseling.  If you may become pregnant, take 400 to 800 micrograms (mcg) of folic acid every day.  If you want to prevent pregnancy, talk to your health care provider about birth control (contraception). Osteoporosis and menopause  Osteoporosis is a disease in which the bones lose minerals and strength with aging. This can result in serious bone fractures. Your risk for osteoporosis can be identified using a bone density scan.  If you are 33 years of age or older, or if you are at risk for osteoporosis and fractures, ask your health care provider if you should be screened.  Ask your health care provider whether you should take a calcium or vitamin D supplement to lower your risk for osteoporosis.  Menopause may have certain physical symptoms and risks.  Hormone replacement therapy may reduce some of these symptoms and risks. Talk to your health care provider about whether hormone replacement therapy is right for you. Follow these instructions at home:  Schedule regular health, dental, and eye exams.  Stay current with your immunizations.  Do not use any tobacco products including cigarettes, chewing tobacco, or electronic cigarettes.  If you are pregnant, do not drink alcohol.  If you are breastfeeding, limit  how much and how often you drink alcohol.  Limit alcohol intake to no more than 1 drink per day for nonpregnant women. One drink equals 12 ounces of beer, 5 ounces of wine, or 1 ounces of hard liquor.  Do not use street drugs.  Do not share needles.  Ask your health care provider for help if you need support or information about quitting drugs.  Tell your health care provider if you often feel depressed.  Tell your health care provider if you have ever been abused or do not feel safe at home. This information is not intended to replace advice given to you by your health care provider. Make sure you discuss any questions you have with your health care provider. Document Released: 11/05/2010 Document Revised: 09/28/2015 Document Reviewed: 01/24/2015  2017 [employer]vier

## 2016-05-01 NOTE — Assessment & Plan Note (Addendum)
Occurring only at night with supine position.  Plain films of cervical spine were normal today .   emg/merve conduction studies to rule out CTS were offered by deferred by patient

## 2016-05-01 NOTE — Progress Notes (Signed)
Patient ID: JOVIANA REMSEN, female    DOB: 10-11-1955  Age: 60 y.o. MRN: [id number]  The patient is here for annual wellness examination and management of other chronic  Problems.\\  Mammogram due feb 2017 Colonoscopy 2012 WAS DONE  OUTSIDE OF SYSTEM   No records available in EPIC    The risk factors are reflected in the social history.  The roster of all physicians providing medical care to patient - is listed in the Snapshot section of the chart.  Activities of daily living:  The patient is not 100% independent in all ADLs: because SHE HAS USHER'S SYNDROME(BLIND AND DEAF/MUTE) AND IS IN A STABLE 12 YEAR RELATIONSHIP, MOTHER IS WITH HER TODAY   Home safety : The patient has smoke detectors in the home. They wear seatbelts.  There are no firearms at home. There is no violence in the home.   There is no risks for hepatitis, STDs or HIV. There is no   history of blood transfusion. They have no travel history to infectious disease endemic areas of the world.  The patient has seen their dentist in the last six month.    They do not  have excessive sun exposure. Discussed the need for sun protection: hats, long sleeves and use of sunscreen if there is significant sun exposure.   Diet: the importance of a healthy diet is discussed. They do have a healthy diet.  The benefits of regular aerobic exercise were discussed. She walks 4 times per week ,  20 minutes.   Depression screen: there are no signs or vegative symptoms of depression- irritability, change in appetite, anhedonia, sadness/tearfullness.  Cognitive assessment: the patient manages all their financial and personal affairs and is actively engaged. They could relate day,date,year and events; recalled 2/3 objects at 3 minutes; performed clock-face test normally.  The following portions of the patient's history were reviewed and updated as appropriate: allergies, current medications, past family history, past medical history,  past  surgical history, past social history  and problem list.  Visual acuity was not assessed per patient preference since she is BLIND.   Hearing ALSO NOT  Assessed.  During the course of the visit the patient was educated and counseled about appropriate screening and preventive services including : fall prevention , diabetes screening, nutrition counseling, colorectal cancer screening, and recommended immunizations.    CC: The primary encounter diagnosis was Numbness of fingers of both hands. Diagnoses of Fatigue, unspecified type, Pure hypercholesterolemia, Breast cancer screening, Vitamin D deficiency, Visit for preventive health examination, and Bilateral hand numbness were also pertinent to this visit.  CC:  RECURRENT NUMBNESS OF HANDS , EPISODIC,  OCCURRING DURING SLEEP.  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   She has a past surgical history that includes Tonsilectomy, adenoidectomy, bilateral myringotomy and tubes (Bilateral, 1968); Abdominal hysterectomy (1986); Cesarean section; Cosmetic surgery; and Tubal ligation.   Her family history includes Arthritis in her mother; Breast cancer in her mother; Cancer in her mother; Hypertension in her father.She reports that she quit smoking about 17 years ago. Her smoking use included Cigarettes. She smoked 1.00 pack per day. She has never used smokeless tobacco. She reports that she drinks alcohol. She reports that she does not use drugs.  Outpatient Medications Prior to Visit  Medication Sig Dispense Refill  . ergocalciferol (DRISDOL) 50000 UNITS capsule Take 1 capsule (50,000 Units total) by mouth once a week. 12  capsule 0   No facility-administered medications prior to visit.     Review of Systems   Patient denies headache, fevers, malaise, unintentional weight loss, skin rash, eye pain, sinus congestion and sinus pain, sore throat, dysphagia,  hemoptysis , cough, dyspnea,  wheezing, chest pain, palpitations, orthopnea, edema, abdominal pain, nausea, melena, diarrhea, constipation, flank pain, dysuria, hematuria, urinary  Frequency, nocturia,, seizures,  Focal weakness, Loss of consciousness,  Tremor, insomnia, depression, anxiety, and suicidal ideation.      Objective:  BP 100/60 (BP Location: Right Arm, Patient Position: Sitting, Cuff Size: Normal)   Pulse 64   Temp 98.1 F (36.7 C) (Oral)   Resp 16   Ht 5' 2.25" (1.581 m)   Wt 154 lb 12 oz (70.2 kg)   SpO2 98%   BMI 28.08 kg/m   Physical Exam  General appearance: alert, cooperative and appears stated age Head: Normocephalic, without obvious abnormality, atraumatic Eyes: conjunctivae/corneas clear. PERRL, EOM's intact. Fundi benign. Ears: normal TM's and external ear canals both ears Nose: Nares normal. Septum midline. Mucosa normal. No drainage or sinus tenderness. Throat: lips, mucosa, and tongue normal; teeth and gums normal Neck: no adenopathy, no carotid bruit, no JVD, supple, symmetrical, trachea midline and thyroid not enlarged, symmetric, no tenderness/mass/nodules Lungs: clear to auscultation bilaterally Breasts: normal appearance, no masses or tenderness Heart: regular rate and rhythm, S1, S2 normal, no murmur, click, rub or gallop Abdomen: soft, non-tender; bowel sounds normal; no masses,  no organomegaly Extremities: extremities normal, atraumatic, no cyanosis or edema Pulses: 2+ and symmetric Skin: Skin color, texture, turgor normal. No rashes or lesions Neurologic: Alert and oriented X 3, normal strength and tone. Normal symmetric reflexes. Normal coordination and gait.     Assessment & Plan:   Problem List Items Addressed This Visit    Bilateral hand numbness    Occurring only at night with supine position.  Plain films of cervical spine were normal today .   emg/merve conduction studies to rule out CTS were offered by deferred by patient        Visit for preventive health  examination    Annual comprehensive preventive exam was done as well as an evaluation and management of chronic conditions .  During the course of the visit the patient was educated and counseled about appropriate screening and preventive services including :  diabetes screening, lipid analysis with projected  10 year  risk for CAD , nutrition counseling, breast, cervical and colorectal cancer screening, and recommended immunizations.  Printed recommendations for health maintenance screenings was given       Other Visit Diagnoses    Numbness of fingers of both hands    -  Primary   Relevant Orders   DG Cervical Spine Complete (Completed)   Fatigue, unspecified type       Relevant Orders   Comprehensive metabolic panel (Completed)   TSH (Completed)   CBC with Differential/Platelet (Completed)   B12 (Completed)   Pure hypercholesterolemia       Relevant Orders   Lipid panel (Completed)   Breast cancer screening       Relevant Orders   MM SCREENING BREAST TOMO BILATERAL   Vitamin D deficiency       Relevant Orders   VITAMIN D 25 Hydroxy (Vit-D Deficiency, Fractures) (Completed)      I have discontinued Ms. Coran's ergocalciferol. I am also having her maintain her Calcium Carbonate-Vitamin D.  Meds ordered this encounter  Medications  . Calcium Carbonate-Vitamin D (CALCIUM  500 + D) 500-125 MG-UNIT TABS    Sig: Take 1 tablet by mouth daily.    Medications Discontinued During This Encounter  Medication Reason  . ergocalciferol (DRISDOL) 50000 UNITS capsule Completed Course    Follow-up: No Follow-up on file.   Crecencio Mc, MD

## 2016-05-02 NOTE — Progress Notes (Signed)
Pre-visit discussion using our clinic review tool. No additional management support is needed unless otherwise documented below in the visit note.  

## 2016-05-15 ENCOUNTER — Encounter: Payer: Self-pay | Admitting: Internal Medicine

## 2016-05-28 ENCOUNTER — Telehealth: Payer: Self-pay | Admitting: Internal Medicine

## 2016-05-28 NOTE — Telephone Encounter (Signed)
Pt is deaf and cannot do the AWV. Thank you!

## 2016-06-07 ENCOUNTER — Telehealth: Payer: Self-pay | Admitting: Internal Medicine

## 2016-06-07 NOTE — Telephone Encounter (Signed)
Pt is deaf and blind pt will not get the AWV. Thank you!

## 2016-06-18 ENCOUNTER — Ambulatory Visit
Admission: RE | Admit: 2016-06-18 | Discharge: 2016-06-18 | Disposition: A | Discharge status: Home / Self Care | Payer: Medicare Other | Source: Ambulatory Visit | Attending: Internal Medicine | Admitting: Internal Medicine

## 2016-06-18 DIAGNOSIS — Z1231 Encounter for screening mammogram for malignant neoplasm of breast: Secondary | ICD-10-CM | POA: Insufficient documentation

## 2016-06-18 DIAGNOSIS — Z1239 Encounter for other screening for malignant neoplasm of breast: Secondary | ICD-10-CM

## 2017-01-01 ENCOUNTER — Telehealth: Payer: Self-pay | Admitting: Internal Medicine

## 2017-01-01 NOTE — Telephone Encounter (Signed)
Spoke to pt mother. She will talk to Cathye to she when she can come in for AWV and call me back to schedule.  Pt is blind and deaf and mother will come to the appt with her.   NOTE* No hx of AWV

## 2017-01-08 ENCOUNTER — Ambulatory Visit (INDEPENDENT_AMBULATORY_CARE_PROVIDER_SITE_OTHER): Payer: PPO

## 2017-01-08 VITALS — BP 102/62 | HR 72 | Temp 98.0°F | Resp 14 | Ht 63.0 in | Wt 164.4 lb

## 2017-01-08 DIAGNOSIS — Z0000 Encounter for general adult medical examination without abnormal findings: Secondary | ICD-10-CM

## 2017-01-08 NOTE — Patient Instructions (Addendum)
  Ms. Attwood , Thank you for taking time to come for your Medicare Wellness Visit. I appreciate your ongoing commitment to your health goals. Please review the following plan we discussed and let me know if I can assist you in the future.   Follow up with Dr. Derrel Nip as needed.    Bring a copy of your [place] and/or Living Will to be scanned into chart.  Have a great day!  These are the goals we discussed: Goals    . Increase physical activity          Bike, walk, use the treadmill 3 times a week, 30 minutes       This is a list of the screening recommended for you and due dates:  Health Maintenance  Topic Date Due  . Pap Smear  01/28/1976  . Flu Shot  12/04/2016  . Mammogram  06/18/2018  . Colon Cancer Screening  02/02/2020  . Tetanus Vaccine  02/02/2023  .  Hepatitis C: One time screening is recommended by Center for Disease Control  (CDC) for  adults born from 18 through 1965.   Completed  . HIV Screening  Completed

## 2017-01-08 NOTE — Progress Notes (Signed)
Subjective:   Helen Mccoy is a 61 y.o. female who presents for an Initial Medicare Annual Wellness Visit.  Review of Systems    No ROS.  Medicare Wellness Visit. Risk factors are reflected in the social history.        Objective:    Today's Vitals   01/08/17 1536  BP: 102/62  Pulse: 72  Resp: 14  Temp: 98 F (36.7 C)  TempSrc: Oral  SpO2: 98%  Weight: 164 lb 6.4 oz (74.6 kg)  Height: 5\' 3"  (1.6 m)   Body mass index is 29.12 kg/m.   Current Medications (verified) Outpatient Encounter Prescriptions as of 01/08/2017  Medication Sig  . Calcium Carbonate-Vitamin D (CALCIUM 500 + D) 500-125 MG-UNIT TABS Take 1 tablet by mouth daily.   No facility-administered encounter medications on file as of 01/08/2017.     Allergies (verified) Sulfa antibiotics   History: Past Medical History:  Diagnosis Date  . Blind    retinitis pigmentosia started at 61 years of age  . 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 ABDOMINAL HYSTERECTOMY  1986   nonmalignant reasons  . CESAREAN SECTION    . COSMETIC SURGERY    . TONSILECTOMY, ADENOIDECTOMY, BILATERAL MYRINGOTOMY AND TUBES Bilateral 1968  . TUBAL LIGATION     Family History  Problem Relation Age of Onset  . Arthritis Mother   . Cancer Mother   . Breast cancer Mother 16  . Hypertension Father   . Macular degeneration Brother    Social History   Occupational History  . Not on file.   Social History Main Topics  . Smoking status: Former Smoker    Packs/day: 1.00    Types: Cigarettes    Quit date: 02/02/1999  . Smokeless tobacco: Never Used  . Alcohol use Yes     Comment: occasionally  . Drug use: No  . Sexual activity: Not Currently    Tobacco Counseling Counseling given: Not Answered   Activities of Daily Living In your present state of health, do you have any difficulty performing the following activities: 01/08/2017  Hearing? Y  Vision? Y  Difficulty  concentrating or making decisions? N  Walking or climbing stairs? Y  Dressing or bathing? N  Doing errands, shopping? Y  Preparing Food and eating ? N  Using the Toilet? N  In the past six months, have you accidently leaked urine? N  Do you have problems with loss of bowel control? N  Managing your Medications? N  Managing your Finances? Y  Comment Partner assists  Housekeeping or managing your Housekeeping? Y  Comment Partner assists  Some recent data might be hidden    Immunizations and Health Maintenance Immunization History  Administered Date(s) Administered  . Influenza Split 04/03/2010, 04/04/2011, 04/03/2012  . Influenza,inj,Quad PF,6+ Mos 02/01/2013  . Influenza-Unspecified 03/29/2015, 02/12/2016  . Tdap 02/01/2013   Health Maintenance Due  Topic Date Due  . PAP SMEAR  01/28/1976  . INFLUENZA VACCINE  12/04/2016    Patient Care Team: Crecencio Mc, MD as PCP - General (Internal Medicine)  Indicate any recent Medical Services you may have received from other than [facility] providers in the past year (date may be approximate).     Assessment:   This is a routine wellness examination for Helen Mccoy. The goal of the wellness visit is to assist the patient how to close the gaps in care and create a preventative care plan for  the patient. Mother is with patient to assist with information, HIPAA compliant.  The roster of all physicians providing medical care to patient is listed in the Snapshot section of the chart.  Taking calcium VIT D as appropriate/Osteoporosis risk reviewed.    Safety issues reviewed; Smoke and carbon monoxide detectors in the home. No firearms in the home.  Wears seatbelts when riding with others. Patient does wear sunscreen or protective clothing when in direct sunlight. No violence in the home.  Patient is alert, normal appearance, oriented to person and place.    Ambulates with walking stick.    BMI- discussed the importance of a healthy diet,  water intake and the benefits of aerobic exercise. Educational material provided.   24 hour diet recall: Breakfast: cereal bar Lunch: [place] cheese roll up, chips Dinner: [employer] fry  Daily fluid intake: 0 cups of caffeine, 8 cups of water  Dental- every 6 months.  Eye- Visual acuity not assessed. Blindness.   Sleep patterns- Sleeps 6-8 hours at night.  Wakes feeling rested.  Influenza vaccine to be administered in October, per patient preference.  Patient Concerns: None at this time. Follow up with PCP as needed.  Hearing/Vision screen Hearing Screening Comments: Deafness, bilateral Vision Screening Comments: Blindness Cataracts extracted Last OV 2 years ago  Dietary issues and exercise activities discussed: Current Exercise Habits: Home exercise routine, Type of exercise: treadmill;calisthenics, Time (Minutes): 30, Frequency (Times/Week): 1, Weekly Exercise (Minutes/Week): 30, Intensity: Mild  Goals    . Increase physical activity          Bike, walk, use the treadmill 3 times a week, 30 minutes      Depression Screen PHQ 2/9 Scores 01/08/2017 05/01/2016  PHQ - 2 Score 0 0    Fall Risk Fall Risk  01/08/2017 05/01/2016  Falls in the past year? No No    Cognitive Function: MMSE - Mini Mental State Exam 01/08/2017  Not completed: Unable to complete        Screening Tests Health Maintenance  Topic Date Due  . PAP SMEAR  01/28/1976  . INFLUENZA VACCINE  12/04/2016  . MAMMOGRAM  06/18/2018  . COLONOSCOPY  02/02/2020  . TETANUS/TDAP  02/02/2023  . Hepatitis C Screening  Completed  . HIV Screening  Completed      Plan:    End of life planning; Advance aging; Advanced directives discussed. Copy of current HCPOA/Living Will requested.    I have personally reviewed and noted the following in the patient's chart:   . Medical and social history . Use of alcohol, tobacco or illicit drugs  . Current medications and supplements . Functional ability and  status . Nutritional status . Physical activity . Advanced directives . List of other physicians . Hospitalizations, surgeries, and ER visits in previous 12 months . Vitals . Screenings to include cognitive, depression, and falls . Referrals and appointments  In addition, I have reviewed and discussed with patient certain preventive protocols, quality metrics, and best practice recommendations. A written personalized care plan for preventive services as well as general preventive health recommendations were provided to patient.     OBrien-Blaney, Denisa L, LPN   06/12/621    I have reviewed the above information and agree with above.   Deborra Medina, MD

## 2017-02-06 NOTE — Telephone Encounter (Signed)
Completed 01/08/17

## 2017-05-01 ENCOUNTER — Encounter: Payer: Self-pay | Admitting: Internal Medicine

## 2017-05-01 ENCOUNTER — Telehealth: Payer: Self-pay | Admitting: Internal Medicine

## 2017-05-01 ENCOUNTER — Ambulatory Visit (INDEPENDENT_AMBULATORY_CARE_PROVIDER_SITE_OTHER): Payer: PPO | Admitting: Internal Medicine

## 2017-05-01 VITALS — BP 106/68 | HR 67 | Temp 97.9°F | Resp 16 | Ht 63.0 in | Wt 164.6 lb

## 2017-05-01 DIAGNOSIS — R635 Abnormal weight gain: Secondary | ICD-10-CM | POA: Diagnosis not present

## 2017-05-01 DIAGNOSIS — H903 Sensorineural hearing loss, bilateral: Secondary | ICD-10-CM | POA: Diagnosis not present

## 2017-05-01 DIAGNOSIS — E559 Vitamin D deficiency, unspecified: Secondary | ICD-10-CM | POA: Diagnosis not present

## 2017-05-01 DIAGNOSIS — Z1239 Encounter for other screening for malignant neoplasm of breast: Secondary | ICD-10-CM

## 2017-05-01 DIAGNOSIS — H3552 Pigmentary retinal dystrophy: Secondary | ICD-10-CM | POA: Diagnosis not present

## 2017-05-01 DIAGNOSIS — E663 Overweight: Secondary | ICD-10-CM

## 2017-05-01 DIAGNOSIS — R200 Anesthesia of skin: Secondary | ICD-10-CM

## 2017-05-01 DIAGNOSIS — E785 Hyperlipidemia, unspecified: Secondary | ICD-10-CM | POA: Diagnosis not present

## 2017-05-01 DIAGNOSIS — Z1231 Encounter for screening mammogram for malignant neoplasm of breast: Secondary | ICD-10-CM | POA: Diagnosis not present

## 2017-05-01 DIAGNOSIS — Z0000 Encounter for general adult medical examination without abnormal findings: Secondary | ICD-10-CM

## 2017-05-01 LAB — COMPREHENSIVE METABOLIC PANEL
ALBUMIN: 4.3 g/dL (ref 3.5–5.2)
ALK PHOS: 71 U/L (ref 39–117)
ALT: 12 U/L (ref 0–35)
AST: 24 U/L (ref 0–37)
BUN: 14 mg/dL (ref 6–23)
CALCIUM: 9.3 mg/dL (ref 8.4–10.5)
CO2: 32 mEq/L (ref 19–32)
CREATININE: 0.71 mg/dL (ref 0.40–1.20)
Chloride: 102 mEq/L (ref 96–112)
GFR: 88.58 mL/min (ref >60.00)
Glucose, Bld: 87 mg/dL (ref 70–99)
Potassium: 4.3 mEq/L (ref 3.5–5.1)
Sodium: 139 mEq/L (ref 135–145)
TOTAL PROTEIN: 7.8 g/dL (ref 6.0–8.3)
Total Bilirubin: 0.5 mg/dL (ref 0.2–1.2)

## 2017-05-01 LAB — LIPID PANEL
CHOLESTEROL: 199 mg/dL (ref 0–200)
HDL: 69.3 mg/dL (ref >39.00)
LDL Cholesterol: 114 mg/dL — ABNORMAL HIGH (ref 0–99)
NonHDL: 129.65
TRIGLYCERIDES: 80 mg/dL (ref 0.0–149.0)
Total CHOL/HDL Ratio: 3
VLDL: 16 mg/dL (ref 0.0–40.0)

## 2017-05-01 NOTE — Progress Notes (Signed)
Patient ID: Helen Mccoy, female    DOB: 04-Apr-1956  Age: 61 y.o. MRN: [id number]  The patient is here for follow up and management of other chronic and acute problems.  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 She continues to live dependently with her romantic partner of 12 years.  The risk factors are reflected in the social history.  The roster of all physicians providing medical care to patient - is listed in the Snapshot section of the chart.  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 She is able to perform dressing, toileting, feeding as well as independent mobility, but cannot cook or drive.   Home safety : The patient has smoke detectors in the home. They wear seatbelts.  There are no firearms at home. There is no violence in the home.   There is no risks for hepatitis, STDs or HIV. There is no   history of blood transfusion. They have no travel history to infectious disease endemic areas of the world.  The patient has seen their dentist in the last six month.   .  They do not  have excessive sun exposure. Discussed the need for sun protection: hats, long sleeves and use of sunscreen if there is significant sun exposure.   Diet: the importance of a healthy diet is discussed. She like salads, fish and snacks daily on  cookies/pretzels.  The benefits of regular aerobic exercise were discussed. She does not exercise despite being retired  She does own a  treadmill and an  exercise bike   Depression screen: there are no signs or vegative symptoms of depression- irritability, change in appetite, anhedonia, sadness/tearfullness.  Cognitive assessment: the patient does not manage all their financial and personal affairs due  to her disability, but she is actively engaged. Cognitive testing was not done.   The following portions of the patient's history were reviewed and updated as appropriate: allergies, current medications, past family history, past medical history,  past surgical history, past social history  and problem list.   body mass index was assessed and reviewed.   During the course of the visit the patient was educated and counseled about appropriate screening and preventive services including : fall prevention , diabetes screening, nutrition counseling, colorectal cancer screening, and recommended immunizations.    CC: The primary encounter diagnosis was Breast cancer screening. Diagnoses of Hyperlipidemia LDL goal <100, Vitamin D deficiency, Weight gain, Overweight (BMI 25.0-29.9), Bilateral hand numbness, Usher's syndrome, and Visit for preventive health examination were also pertinent to this visit.  CERVICAL SPINE NORMAL DEC 2017 , emg/nerve studies deferred by patient  Weight regain of 10 lb since last year bmi now 29     History Helen Mccoy has a past medical history of Blind and Deafness (9 /24/1957).   She has a past surgical history that includes Tonsilectomy, adenoidectomy, bilateral myringotomy and tubes (Bilateral, 1968); Abdominal hysterectomy (1986); Cesarean section; Cosmetic surgery; and Tubal ligation.   Her family history includes Arthritis in her mother; Breast cancer (age of onset: 37) in her mother; Cancer in her mother; Hypertension in her father; Macular degeneration in her brother.She reports that she quit smoking about 18 years ago. Her smoking use included cigarettes. She smoked 1.00 pack per  day. she has never used smokeless tobacco. She reports that she drinks alcohol. She reports that she does not use drugs.  Outpatient Medications Prior to Visit  Medication Sig Dispense Refill  . Calcium Carbonate-Vitamin D (CALCIUM 500 + D) 500-125 MG-UNIT TABS Take 1 tablet by mouth daily.      No facility-administered medications prior to visit.     Review of Systems   Patient denies headache, fevers, malaise, unintentional weight loss, skin rash, eye pain, sinus congestion and sinus pain, sore throat, dysphagia,  hemoptysis , cough, dyspnea, wheezing, chest pain, palpitations, orthopnea, edema, abdominal pain, nausea, melena, diarrhea, constipation, flank pain, dysuria, hematuria, urinary  Frequency, nocturia, numbness, tingling, seizures,  Focal weakness, Loss of consciousness,  Tremor, insomnia, depression, anxiety, and suicidal ideation.      Objective:  BP 106/68 (BP Location: Left Arm, Patient Position: Sitting, Cuff Size: Normal)   Pulse 67   Temp 97.9 F (36.6 C) (Oral)   Resp 16   Ht 5\' 3"  (1.6 m)   Wt 164 lb 9.6 oz (74.7 kg)   SpO2 98%   BMI 29.16 kg/m   Physical Exam   General appearance: alert, cooperative and appears stated age Head: Normocephalic, without obvious abnormality, atraumatic Eyes: conjunctivae/corneas clear. PERRL, EOM's intact. Fundi benign. Ears: normal TM's and external ear canals both ears Nose: Nares normal. Septum midline. Mucosa normal. No drainage or sinus tenderness. Throat: lips, mucosa, and tongue normal; teeth and gums normal Neck: no adenopathy, no carotid bruit, no JVD, supple, symmetrical, trachea midline and thyroid not enlarged, symmetric, no tenderness/mass/nodules Lungs: clear to auscultation bilaterally Breasts: normal appearance, no masses or tenderness Heart: regular rate and rhythm, S1, S2 normal, no murmur, click, rub or gallop Abdomen: soft, non-tender; bowel sounds normal; no masses,  no organomegaly Extremities: extremities normal, atraumatic, no cyanosis or edema Pulses: 2+ and symmetric Skin: Skin color, texture, turgor normal. No rashes or lesions Neurologic: Alert and oriented X 3, normal strength and tone. Normal symmetric reflexes. Normal coordination and gait.      Assessment & Plan:   Problem List  Items Addressed This Visit    Bilateral hand numbness    Occurring only at night with supine position.  Plain films of cervical spine were normal today .   emg/merve conduction studies to rule out CTS were again offered by deferred by patient        Overweight (BMI 25.0-29.9)    I have addressed  BMI and recommended wt loss of 10% of body weigh over the next 6 months using a low glycemic index diet and regular exercise a minimum of 5 days per week.  She has no evidene of thyroid disorder,  Diabetes or hyperlipidemia by today's labs   Lab Results  Component Value Date   TSH 2.50 05/01/2017   Lab Results  Component Value Date   CHOL 199 05/01/2017   HDL 69.30 05/01/2017   LDLCALC 114 (H) 05/01/2017   LDLDIRECT 125.0 04/28/2015   TRIG 80.0 05/01/2017   CHOLHDL 3 05/01/2017         Usher's syndrome    She has limited vision in the right eye and complete loss of vision in the left secondary to retinitis pigmentosa .  Employs palpable sign language for communication       Visit for preventive health examination     a comprehensive physical exam  Was done today including breast exam. She is s/p TAH for noncancerous reasons and defers pelvic exam.  Weight gain was addressed and fasting lipids are normal.    No results found for: HGBA1C Lab Results  Component Value Date   CHOL 199 05/01/2017   HDL 69.30 05/01/2017   LDLCALC 114 (H) 05/01/2017   LDLDIRECT 125.0 04/28/2015   TRIG 80.0 05/01/2017   CHOLHDL 3 05/01/2017          Other Visit Diagnoses    Breast cancer screening    -  Primary   Relevant Orders   MM SCREENING BREAST TOMO BILATERAL   Hyperlipidemia LDL goal <100       Relevant Orders   Lipid panel (Completed)   Vitamin D deficiency       Relevant Orders   VITAMIN D 25 Hydroxy (Vit-D Deficiency, Fractures) (Completed)   Weight gain       Relevant Orders   Comprehensive metabolic panel (Completed)   TSH (Completed)    A total of 40 minutes was spent with  patient more than half of which was spent in counseling patient on the above mentioned issues , reviewing and explaining recent labs and imaging studies done, and coordination of care.  I am having Helen Mccoy. Helen Mccoy maintain her Calcium Carbonate-Vitamin D.  No orders of the defined types were placed in this encounter.   There are no discontinued medications.  Follow-up: Return in about 1 year (around 05/01/2018).   Crecencio Mc, MD

## 2017-05-01 NOTE — Patient Instructions (Addendum)
I want you to lose ten lbs this year through diet and [place] Maintenance for Postmenopausal Women Menopause is a normal process in which your reproductive ability comes to an end. This process happens gradually over a span of months to years, usually between the ages of 62 and 26. Menopause is complete when you have missed 12 consecutive menstrual periods. It is important to talk with your health care provider about some of the most common conditions that affect postmenopausal women, such as heart disease, cancer, and bone loss (osteoporosis). Adopting a healthy lifestyle and getting preventive care can help to promote your health and wellness. Those actions can also lower your chances of developing some of these common conditions. What should I know about menopause? During menopause, you may experience a number of symptoms, such as:  Moderate-to-severe hot flashes.  Night sweats.  Decrease in sex drive.  Mood swings.  Headaches.  Tiredness.  Irritability.  Memory problems.  Insomnia.  Choosing to treat or not to treat menopausal changes is an individual decision that you make with your health care provider. What should I know about hormone replacement therapy and supplements? Hormone therapy products are effective for treating symptoms that are associated with menopause, such as hot flashes and night sweats. Hormone replacement carries certain risks, especially as you become older. If you are thinking about using estrogen or estrogen with progestin treatments, discuss the benefits and risks with your health care provider. What should I know about heart disease and stroke? Heart disease, heart attack, and stroke become more likely as you age. This may be due, in part, to the hormonal changes that your body experiences during menopause. These can affect how your body processes dietary fats, triglycerides, and cholesterol. Heart attack and stroke are both medical  emergencies. There are many things that you can do to help prevent heart disease and stroke:  Have your blood pressure checked at least every 1-2 years. High blood pressure causes heart disease and increases the risk of stroke.  If you are 66-20 years old, ask your health care provider if you should take aspirin to prevent a heart attack or a stroke.  Do not use any tobacco products, including cigarettes, chewing tobacco, or electronic cigarettes. If you need help quitting, ask your health care provider.  It is important to eat a healthy diet and maintain a healthy weight. ? Be sure to include plenty of vegetables, fruits, low-fat dairy products, and lean protein. ? Avoid eating foods that are high in solid fats, added sugars, or salt (sodium).  Get regular exercise. This is one of the most important things that you can do for your health. ? Try to exercise for at least 150 minutes each week. The type of exercise that you do should increase your heart rate and make you sweat. This is known as moderate-intensity exercise. ? Try to do strengthening exercises at least twice each week. Do these in addition to the moderate-intensity exercise.  Know your numbers.Ask your health care provider to check your cholesterol and your blood glucose. Continue to have your blood tested as directed by your health care provider.  What should I know about cancer screening? There are several types of cancer. Take the following steps to reduce your risk and to catch any cancer development as early as possible. Breast Cancer  Practice breast self-awareness. ? This means understanding how your breasts normally appear and feel. ? It also means doing regular breast self-exams. Let  your health care provider know about any changes, no matter how small.  If you are 54 or older, have a clinician do a breast exam (clinical breast exam or CBE) every year. Depending on your age, family history, and medical history, it  may be recommended that you also have a yearly breast X-ray (mammogram).  If you have a family history of breast cancer, talk with your health care provider about genetic screening.  If you are at high risk for breast cancer, talk with your health care provider about having an MRI and a mammogram every year.  Breast cancer (BRCA) gene test is recommended for women who have family members with BRCA-related cancers. Results of the assessment will determine the need for genetic counseling and BRCA1 and for BRCA2 testing. BRCA-related cancers include these types: ? Breast. This occurs in males or females. ? Ovarian. ? Tubal. This may also be called fallopian tube cancer. ? Cancer of the abdominal or pelvic lining (peritoneal cancer). ? Prostate. ? Pancreatic.  Cervical, Uterine, and Ovarian Cancer Your health care provider may recommend that you be screened regularly for cancer of the pelvic organs. These include your ovaries, uterus, and vagina. This screening involves a pelvic exam, which includes checking for microscopic changes to the surface of your cervix (Pap test).  For women ages 21-65, health care providers may recommend a pelvic exam and a Pap test every three years. For women ages 17-65, they may recommend the Pap test and pelvic exam, combined with testing for human papilloma virus (HPV), every five years. Some types of HPV increase your risk of cervical cancer. Testing for HPV may also be done on women of any age who have unclear Pap test results.  Other health care providers may not recommend any screening for nonpregnant women who are considered low risk for pelvic cancer and have no symptoms. Ask your health care provider if a screening pelvic exam is right for you.  If you have had past treatment for cervical cancer or a condition that could lead to cancer, you need Pap tests and screening for cancer for at least 20 years after your treatment. If Pap tests have been discontinued  for you, your risk factors (such as having a new sexual partner) need to be reassessed to determine if you should start having screenings again. Some women have medical problems that increase the chance of getting cervical cancer. In these cases, your health care provider may recommend that you have screening and Pap tests more often.  If you have a family history of uterine cancer or ovarian cancer, talk with your health care provider about genetic screening.  If you have vaginal bleeding after reaching menopause, tell your health care provider.  There are currently no reliable tests available to screen for ovarian cancer.  Lung Cancer Lung cancer screening is recommended for adults 68-76 years old who are at high risk for lung cancer because of a history of smoking. A yearly low-dose CT scan of the lungs is recommended if you:  Currently smoke.  Have a history of at least 30 pack-years of smoking and you currently smoke or have quit within the past 15 years. A pack-year is smoking an average of one pack of cigarettes per day for one year.  Yearly screening should:  Continue until it has been 15 years since you quit.  Stop if you develop a health problem that would prevent you from having lung cancer treatment.  Colorectal Cancer  This type of  cancer can be detected and can often be prevented.  Routine colorectal cancer screening usually begins at age 49 and continues through age 61.  If you have risk factors for colon cancer, your health care provider may recommend that you be screened at an earlier age.  If you have a family history of colorectal cancer, talk with your health care provider about genetic screening.  Your health care provider may also recommend using home test kits to check for hidden blood in your stool.  A small camera at the end of a tube can be used to examine your colon directly (sigmoidoscopy or colonoscopy). This is done to check for the earliest forms of  colorectal cancer.  Direct examination of the colon should be repeated every 5-10 years until age 11. However, if early forms of precancerous polyps or small growths are found or if you have a family history or genetic risk for colorectal cancer, you may need to be screened more often.  Skin Cancer  Check your skin from head to toe regularly.  Monitor any moles. Be sure to tell your health care provider: ? About any new moles or changes in moles, especially if there is a change in a mole's shape or color. ? If you have a mole that is larger than the size of a pencil eraser.  If any of your family members has a history of skin cancer, especially at a young age, talk with your health care provider about genetic screening.  Always use sunscreen. Apply sunscreen liberally and repeatedly throughout the day.  Whenever you are outside, protect yourself by wearing long sleeves, pants, a wide-brimmed hat, and sunglasses.  What should I know about osteoporosis? Osteoporosis is a condition in which bone destruction happens more quickly than new bone creation. After menopause, you may be at an increased risk for osteoporosis. To help prevent osteoporosis or the bone fractures that can happen because of osteoporosis, the following is recommended:  If you are 110-65 years old, get at least 1,000 mg of calcium and at least 600 mg of vitamin D per day.  If you are older than age 17 but younger than age 66, get at least 1,200 mg of calcium and at least 600 mg of vitamin D per day.  If you are older than age 14, get at least 1,200 mg of calcium and at least 800 mg of vitamin D per day.  Smoking and excessive alcohol intake increase the risk of osteoporosis. Eat foods that are rich in calcium and vitamin D, and do weight-bearing exercises several times each week as directed by your health care provider. What should I know about how menopause affects my mental health? Depression may occur at any age, but it  is more common as you become older. Common symptoms of depression include:  Low or sad mood.  Changes in sleep patterns.  Changes in appetite or eating patterns.  Feeling an overall lack of motivation or enjoyment of activities that you previously enjoyed.  Frequent crying spells.  Talk with your health care provider if you think that you are experiencing depression. What should I know about immunizations? It is important that you get and maintain your immunizations. These include:  Tetanus, diphtheria, and pertussis (Tdap) booster vaccine.  Influenza every year before the flu season begins.  Pneumonia vaccine.  Shingles vaccine.  Your health care provider may also recommend other immunizations. This information is not intended to replace advice given to you by your health care  provider. Make sure you discuss any questions you have with your health care provider. Document Released: 06/14/2005 Document Revised: 11/10/2015 Document Reviewed: 01/24/2015 Elsevier Interactive Patient Education  2018 [employer].

## 2017-05-01 NOTE — Telephone Encounter (Signed)
Pt would like to have blood work done prior to [employer]. Order needed please and thank you!  Call caregiver @ [phone].

## 2017-05-02 LAB — TSH: TSH: 2.5 u[IU]/mL (ref 0.35–4.50)

## 2017-05-02 LAB — VITAMIN D 25 HYDROXY (VIT D DEFICIENCY, FRACTURES): VITD: 34.86 ng/mL (ref 30.00–100.00)

## 2017-05-03 NOTE — Assessment & Plan Note (Signed)
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  Employs palpable sign language for communication  

## 2017-05-03 NOTE — Assessment & Plan Note (Addendum)
I have addressed  BMI and recommended wt loss of 10% of body weigh over the next 6 months using a low glycemic index diet and regular exercise a minimum of 5 days per week.  She has no evidene of thyroid disorder,  Diabetes or hyperlipidemia by today's labs   Lab Results  Component Value Date   TSH 2.50 05/01/2017   Lab Results  Component Value Date   CHOL 199 05/01/2017   HDL 69.30 05/01/2017   LDLCALC 114 (H) 05/01/2017   LDLDIRECT 125.0 04/28/2015   TRIG 80.0 05/01/2017   CHOLHDL 3 05/01/2017

## 2017-05-03 NOTE — Assessment & Plan Note (Signed)
a comprehensive physical exam  Was done today including breast exam. She is s/p TAH for noncancerous reasons and defers pelvic exam.   Weight gain was addressed and fasting lipids are normal.    No results found for: HGBA1C Lab Results  Component Value Date   CHOL 199 05/01/2017   HDL 69.30 05/01/2017   LDLCALC 114 (H) 05/01/2017   LDLDIRECT 125.0 04/28/2015   TRIG 80.0 05/01/2017   CHOLHDL 3 05/01/2017

## 2017-05-03 NOTE — Assessment & Plan Note (Signed)
Occurring only at night with supine position.  Plain films of cervical spine were normal today .   emg/merve conduction studies to rule out CTS were again offered by deferred by patient

## 2017-06-10 NOTE — Telephone Encounter (Signed)
Pt had labs the same day of her CPE

## 2017-06-20 ENCOUNTER — Ambulatory Visit
Admission: RE | Admit: 2017-06-20 | Discharge: 2017-06-20 | Disposition: A | Discharge status: Home / Self Care | Payer: PPO | Source: Ambulatory Visit | Attending: Internal Medicine | Admitting: Internal Medicine

## 2017-06-20 DIAGNOSIS — Z1239 Encounter for other screening for malignant neoplasm of breast: Secondary | ICD-10-CM

## 2017-06-20 DIAGNOSIS — Z1231 Encounter for screening mammogram for malignant neoplasm of breast: Secondary | ICD-10-CM | POA: Insufficient documentation

## 2017-12-30 ENCOUNTER — Encounter: Payer: Self-pay | Admitting: Family Medicine

## 2017-12-30 ENCOUNTER — Ambulatory Visit (INDEPENDENT_AMBULATORY_CARE_PROVIDER_SITE_OTHER): Payer: PPO

## 2017-12-30 ENCOUNTER — Ambulatory Visit (INDEPENDENT_AMBULATORY_CARE_PROVIDER_SITE_OTHER): Payer: PPO | Admitting: Family Medicine

## 2017-12-30 VITALS — BP 122/76 | HR 73 | Temp 98.2°F | Resp 16 | Wt 166.0 lb

## 2017-12-30 DIAGNOSIS — M25561 Pain in right knee: Secondary | ICD-10-CM

## 2017-12-30 DIAGNOSIS — M7989 Other specified soft tissue disorders: Secondary | ICD-10-CM | POA: Diagnosis not present

## 2017-12-30 MED ORDER — MELOXICAM 7.5 MG PO TABS: 7.5000 mg | ORAL_TABLET | Freq: Every day | ORAL | 0 refills | Status: DC

## 2017-12-30 NOTE — Patient Instructions (Signed)

## 2017-12-30 NOTE — Progress Notes (Signed)
Subjective:    Patient ID: Helen Mccoy, female    DOB: 1955/09/16, 62 y.o.   MRN: [id number]  HPI  Presents to clinic with pain and swelling in right knee for approximately 1 week.  States she noticed the pain after going for a long walk.  She will have a lot of pain when sleeping at night and laying on her side; right knee touches left knee, she will return over to other side, but pain persists.  She has tried using pillow between knees but does seem to help.  Mother brought her a medium soft knee brace, but it was too tight.  She has been using ibuprofen a few times a day as needed without much help and pain relief.  Denies any falling or twisting of the right knee.  Sign language interpreter and mother present with patient in clinic.  Patient Active Problem List   Diagnosis Date Noted  . Bilateral hand numbness 05/01/2016  . Visit for preventive health examination 04/30/2015  . 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 Overweight (BMI 25.0-29.9) 02/01/2013  . Constipation, slow transit 02/01/2013    Social History   Tobacco Use  . Smoking status: Former Smoker    Packs/day: 1.00    Types: Cigarettes    Last attempt to quit: 02/02/1999    Years since quitting: 18.9  . Smokeless tobacco: Never Used  Substance Use Topics  . Alcohol use: Yes    Comment: occasionally   Review of Systems   Constitutional: Negative for chills, fatigue and fever.  HENT: Negative for congestion, ear pain, sinus pain and sore throat.   Eyes: Negative.   Respiratory: Negative for cough, shortness of breath and wheezing.   Cardiovascular: Negative for chest pain, palpitations and leg swelling.  Gastrointestinal: Negative for abdominal pain, diarrhea, nausea and vomiting.  Genitourinary: Negative for dysuria, frequency and urgency.  Musculoskeletal: Knee pain RIGHT Skin: Negative for color change, pallor and rash.  Neurological: Negative for syncope,  light-headedness and headaches.  Psychiatric/Behavioral: The patient is not nervous/anxious.       Objective:   Physical Exam  Constitutional: She is oriented to person, place, and time. She appears well-developed and well-nourished. No distress.  HENT:  Head: Normocephalic.  Eyes: No scleral icterus.  Cardiovascular: Normal rate.  Pulmonary/Chest: Effort normal. No respiratory distress.  Musculoskeletal: She exhibits tenderness. She exhibits no edema or deformity.  Pain at extreme limits of range, full flexion and full extension.  Tenderness when MCL area of right knee palpated. Patella in normal place.  Knee does not appear deformed.  Negative anterior and posterior drawer test.  Gait seems to be normal, no limp noted when observed patient walking in hallway.  Neurological: She is alert and oriented to person, place, and time.  Skin: Skin is warm and dry. No erythema. No pallor.  Psychiatric: She has a normal mood and affect. Her behavior is normal.  Nursing note and vitals reviewed.  Vitals:   12/30/17 1437  BP: 122/76  Pulse: 73  Resp: 16  Temp: 98.2 F (36.8 C)  SpO2: 98%      Assessment & Plan:    Right knee pain - patient will get knee x-ray.  We will start daily to better control..  Advised she may also take Tylenol with this medication.  Mom will return medium knee brace and get large soft knee brace for patient to wear to see if this helps control swelling and  give knee joint support.  Once x-ray report is back we can consider referral to physical therapy and/or orthopedic further evaluation and management.

## 2017-12-31 NOTE — Progress Notes (Unsigned)
MRI knee ordered.

## 2018-01-06 ENCOUNTER — Telehealth: Payer: Self-pay

## 2018-01-06 NOTE — Telephone Encounter (Signed)
Pt's mother sent a mychart message in regards to pt's MRI. Mother stated that she has not heard anything about the MRI being scheduled.

## 2018-01-09 ENCOUNTER — Other Ambulatory Visit: Payer: Self-pay | Admitting: Family Medicine

## 2018-01-09 ENCOUNTER — Ambulatory Visit (INDEPENDENT_AMBULATORY_CARE_PROVIDER_SITE_OTHER): Payer: PPO

## 2018-01-09 VITALS — BP 124/68 | HR 69 | Temp 98.5°F | Resp 15 | Ht 63.0 in | Wt 165.0 lb

## 2018-01-09 DIAGNOSIS — Z0000 Encounter for general adult medical examination without abnormal findings: Secondary | ICD-10-CM

## 2018-01-09 DIAGNOSIS — M25561 Pain in right knee: Secondary | ICD-10-CM

## 2018-01-09 MED ORDER — MELOXICAM 15 MG PO TABS: 15.0000 mg | ORAL_TABLET | Freq: Every day | ORAL | 0 refills | Status: DC

## 2018-01-09 MED ORDER — CODEINE SULFATE 15 MG PO TABS: 15.0000 mg | ORAL_TABLET | Freq: Four times a day (QID) | ORAL | 0 refills | Status: AC | PRN

## 2018-01-09 NOTE — Progress Notes (Signed)
Subjective:   Helen Mccoy is a 62 y.o. female who presents for Medicare Annual (Subsequent) preventive examination.  Review of Systems:  No ROS.  Medicare Wellness Visit. Additional risk factors are reflected in the social history.       Objective:     Vitals: BP 124/68 (BP Location: Right Arm, Patient Position: Sitting, Cuff Size: Normal)   Pulse 69   Temp 98.5 F (36.9 C) (Oral)   Resp 15   Ht 5\' 3"  (1.6 m)   Wt 165 lb (74.8 kg)   SpO2 98%   BMI 29.23 kg/m   Body mass index is 29.23 kg/m.  Advanced Directives 01/09/2018 01/08/2017  Does Patient Have a Medical Advance Directive? Yes Yes  Type of Paramedic of [place];Living will [place]  Does patient want to make changes to medical advance directive? No - Patient declined No - Patient declined  Copy of [place] in Chart? Yes No - copy requested    Tobacco Social History   Tobacco Use  Smoking Status Former Smoker  . Packs/day: 1.00  . Types: Cigarettes  . Last attempt to quit: 02/02/1999  . Years since quitting: 18.9  Smokeless Tobacco Never Used     Counseling given: Not Answered   Clinical Intake:  Pre-visit preparation completed: Yes  Pain : No/denies pain     Nutritional Status: BMI 25 -29 Overweight Diabetes: No  How often do you need to have someone help you when you read instructions, pamphlets, or other written materials from your doctor or pharmacy?: 5 - Always  Interpreter Needed?: Yes Interpreter Agency: Recruitment consultant Interpreter Name: Anitra Lauth Interpreter ID: None  Information entered by :: Denisa O'Brien-Blaney LPN, [place]  Past Medical History:  Diagnosis Date  . Blind    retinitis pigmentosia started at 62 years of age  . 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 ABDOMINAL HYSTERECTOMY  1986   nonmalignant reasons  . CESAREAN SECTION    .  COSMETIC SURGERY    . TONSILECTOMY, ADENOIDECTOMY, BILATERAL MYRINGOTOMY AND TUBES Bilateral 1968  . TUBAL LIGATION     Family History  Problem Relation Age of Onset  . Arthritis Mother   . Cancer Mother   . Breast cancer Mother 59  . Hypertension Father   . Stroke Father   . Macular degeneration Brother    Social History   Socioeconomic History  . Marital status: Divorced    Spouse name: Not on file  . Number of children: Not on file  . Years of education: Not on file  . Highest education level: Not on file  Occupational History  . Not on file  Social Needs  . Financial resource strain: Not hard at all  . Food insecurity:    Worry: Never true    Inability: Never true  . Transportation needs:    Medical: No    Non-medical: No  Tobacco Use  . Smoking status: Former Smoker    Packs/day: 1.00    Types: Cigarettes    Last attempt to quit: 02/02/1999    Years since quitting: 18.9  . Smokeless tobacco: Never Used  Substance and Sexual Activity  . Alcohol use: Yes    Comment: occasionally  . Drug use: No  . Sexual activity: Not Currently  Lifestyle  . Physical activity:    Days per week: Not on file    Minutes  per session: Not on file  . Stress: Not on file  Relationships  . Social connections:    Talks on phone: Not on file    Gets together: Not on file    Attends religious service: Not on file    Active member of club or organization: Not on file    Attends meetings of clubs or organizations: Not on file    Relationship status: Not on file  Other Topics Concern  . Not on file  Social History Narrative  . Not on file    Outpatient Encounter Medications as of 01/09/2018  Medication Sig  . Calcium Carbonate-Vitamin D (CALCIUM 500 + D) 500-125 MG-UNIT TABS Take 1 tablet by mouth daily.  . [DISCONTINUED] meloxicam (MOBIC) 7.5 MG tablet Take 1 tablet (7.5 mg total) by mouth daily. No additional advil with this medication   No facility-administered encounter  medications on file as of 01/09/2018.     Activities of Daily Living In your present state of health, do you have any difficulty performing the following activities: 01/09/2018  Hearing? Y  Vision? Y  Difficulty concentrating or making decisions? N  Walking or climbing stairs? Y  Dressing or bathing? N  Doing errands, shopping? Y  Preparing Food and eating ? N  Using the Toilet? N  In the past six months, have you accidently leaked urine? N  Do you have problems with loss of bowel control? N  Managing your Medications? N  Managing your Finances? Y  Comment Partner assists  Housekeeping or managing your Housekeeping? Y  Comment Partner assists  Some recent data might be hidden    Patient Care Team: Crecencio Mc, MD as PCP - General (Internal Medicine)    Assessment:   This is a routine wellness examination for [place].  The goal of the wellness visit is to assist the patient how to close the gaps in care and create a preventative care plan for the patient. Patient presents with mother and interpreter, HIPAA compliant. Information received from both parties.   The roster of all physicians providing medical care to patient is listed in the Snapshot section of the chart.  Taking calcium VIT D as appropriate/Osteoporosis risk reviewed.    Safety issues reviewed; Lives with partner. Smoke and carbon monoxide detectors in the home. No firearms in the home. Wears seatbelts when riding with others. No violence in the home.  They do not have excessive sun exposure.  Discussed the need for sun protection: hats, long sleeves and the use of sunscreen if there is significant sun exposure.  Ambulates with cane. Mother and partner of more than 20 years assists with ADLs as needed.   BMI- discussed the importance of a healthy diet, water intake and the benefits of aerobic exercise.  24 hour diet recall: Regular diet  Dental- every 6 months.  Sleep patterns- generally sleeps well.   Currently dealing with R knee pain that makes it a little harder to rest through the night.  Seen by NP a week ago. Xray taken, medication sent to pharmacy for pain relief, MRI ordered. Complains today of no pain relief and sleep deprived. It does not hurt when sitting or standing. Only when walking or lying on her side with knees touching for sleep, which is her most comfortable position.   Awaiting MRI. Deferred to NP for follow up. Codeine and increased dosage of Meloxicam sent to pharmacy per NP.  Follow up with your doctor if symptoms worsen and/or as needed.  MRI as directed.   Influenza vaccine discussed. She plans to receive later in the season.  Patient Concerns:  Difficulty understanding Xray results of R knee. Results explained.  She and mother agree to understanding.    Exercise Activities and Dietary recommendations Current Exercise Habits: Home exercise routine, Type of exercise: walking;treadmill;calisthenics, Time (Minutes): 60, Frequency (Times/Week): 3, Weekly Exercise (Minutes/Week): 180, Intensity: Moderate  Goals      Patient Stated   . Limit physical activity (pt-stated)     Limit walking for hours at a time while awaiting MRI  Less stress on possible ligament injury        Fall Risk Fall Risk  01/09/2018 01/08/2017 05/01/2016  Falls in the past year? No No No   Depression Screen PHQ 2/9 Scores 01/09/2018 01/08/2017 05/01/2016  PHQ - 2 Score 0 0 0     Cognitive Function MMSE - Mini Mental State Exam 01/09/2018 01/08/2017  Not completed: Unable to complete Unable to complete     6CIT Screen 01/09/2018  What Year? 0 points  What month? 0 points  What time? (No Data)  Count back from 20 0 points  Months in reverse 0 points    Immunization History  Administered Date(s) Administered  . Influenza Split 04/03/2010, 04/04/2011, 04/03/2012  . Influenza,inj,Quad PF,6+ Mos 02/01/2013  . Influenza-Unspecified 03/29/2015, 02/12/2016, 02/19/2017  . Tdap 02/01/2013   Screening  Tests Health Maintenance  Topic Date Due  . INFLUENZA VACCINE  12/04/2017  . MAMMOGRAM  06/21/2019  . COLONOSCOPY  02/02/2020  . TETANUS/TDAP  02/02/2023  . Hepatitis C Screening  Completed  . HIV Screening  Completed      Plan:   End of life planning; Advance aging; Advanced directives discussed. Copy of current HCPOA/Living Will requested.    I have personally reviewed and noted the following in the patient's chart:   . Medical and social history . Use of alcohol, tobacco or illicit drugs  . Current medications and supplements . Functional ability and status . Nutritional status . Physical activity . Advanced directives . List of other physicians . Hospitalizations, surgeries, and ER visits in previous 12 months . Vitals . Screenings to include cognitive, depression, and falls . Referrals and appointments  In addition, I have reviewed and discussed with patient certain preventive protocols, quality metrics, and best practice recommendations. A written personalized care plan for preventive services as well as general preventive health recommendations were provided to patient.     Varney Biles, LPN  12/11/3252

## 2018-01-09 NOTE — Patient Instructions (Addendum)
  Helen Mccoy , Thank you for taking time to come for your Medicare Wellness Visit. I appreciate your ongoing commitment to your health goals. Please review the following plan we discussed and let me know if I can assist you in the future.   Follow up as needed.    Bring a copy of your [place] and/or Living Will to be scanned into chart.  Have a great day!  These are the goals we discussed: Goals      Patient Stated   . Limit physical activity (pt-stated)     Limit walking for hours at a time while awaiting MRI  Less stress on possible ligament injury        This is a list of the screening recommended for you and due dates:  Health Maintenance  Topic Date Due  . Flu Shot  12/04/2017  . Mammogram  06/21/2019  . Colon Cancer Screening  02/02/2020  . Tetanus Vaccine  02/02/2023  .  Hepatitis C: One time screening is recommended by Center for Disease Control  (CDC) for  adults born from [birth year] through 1966.   Completed  . HIV Screening  Completed

## 2018-01-09 NOTE — Progress Notes (Signed)
Meloxicam increased to 15mg  per day  Small supply of codeine for severe pain sent in -- states tylenol upsets stomach so that is why PLAIN codeine was chosen instead of combo tylenol-codeine

## 2018-01-12 NOTE — Progress Notes (Signed)
Reviewed, agree with LPN note  MRI of knee appt is upcoming. Patient's meloxicam increased to 15mg  per day for better pain control and given small supply of codeine to use as needed for more severe pain.  LG

## 2018-01-16 ENCOUNTER — Ambulatory Visit: Payer: PPO

## 2018-01-21 ENCOUNTER — Ambulatory Visit
Admission: RE | Admit: 2018-01-21 | Discharge: 2018-01-21 | Disposition: A | Discharge status: Home / Self Care | Payer: PPO | Source: Ambulatory Visit | Attending: Family Medicine | Admitting: Family Medicine

## 2018-01-21 DIAGNOSIS — M25561 Pain in right knee: Secondary | ICD-10-CM | POA: Diagnosis not present

## 2018-01-21 DIAGNOSIS — M71561 Other bursitis, not elsewhere classified, right knee: Secondary | ICD-10-CM | POA: Diagnosis not present

## 2018-01-30 ENCOUNTER — Telehealth: Payer: Self-pay | Admitting: Family Medicine

## 2018-01-30 DIAGNOSIS — M25561 Pain in right knee: Secondary | ICD-10-CM

## 2018-01-30 NOTE — Telephone Encounter (Signed)
Can patients mother please be called again regarding knee MRI results?  She sent a message saying she had not gotten results yet but it looks like patina/PEC talked with her  Notes recorded by Nona Dell, CMA on 01/22/2018 at 5:15 PM EDT Pt's mother Remo Lipps) returned call for MRI report of pt's right knee. She voiced understanding and will get back to the provider regarding an orthopedic referral. ------  Notes recorded by Curlene Labrum, RN on 01/22/2018 at 4:24 PM EDT Pt's mother Remo Lipps) returned call for MRI report of pt's right knee. She voiced understanding and will get back to the provider regarding an orthopedic referral. ------  Notes recorded by Neta Ehlers, RMA on 01/22/2018 at 1:52 PM EDT Called Pt left message to return call. Okay for pec to speak to patient ------  Notes recorded by Jodelle Green, FNP on 01/22/2018 at 1:19 PM EDT Knee MRI did not show any tears or injury to the ligaments or meniscus of knee. There is bursitis in the MCL, which is inflammation in a knee ligament. If pain persists I am happy to do orthopedic referral.

## 2018-01-30 NOTE — Telephone Encounter (Signed)
Orthopedic referral in as well  LG

## 2018-01-30 NOTE — Telephone Encounter (Signed)
Called patient's mother Remo Lipps and gave her the results of the MRI with recommendations. Patients mother verbalized understanding.

## 2018-03-23 ENCOUNTER — Telehealth: Payer: Self-pay | Admitting: Internal Medicine

## 2018-03-23 DIAGNOSIS — Z0000 Encounter for general adult medical examination without abnormal findings: Secondary | ICD-10-CM

## 2018-03-23 NOTE — Telephone Encounter (Signed)
Wanting fasting labs done prior to physical on 12.27.19.

## 2018-03-24 NOTE — Telephone Encounter (Signed)
Ordered CMP, CBC, TSH, lipid panel. Is there any thing else that needs to be ordered?

## 2018-03-24 NOTE — Addendum Note (Signed)
Addended by: Adair Laundry on: 03/24/2018 09:43 AM   Modules accepted: Orders

## 2018-03-24 NOTE — Telephone Encounter (Signed)
Great thankYou!!

## 2018-05-01 ENCOUNTER — Other Ambulatory Visit (INDEPENDENT_AMBULATORY_CARE_PROVIDER_SITE_OTHER): Payer: PPO

## 2018-05-01 DIAGNOSIS — E782 Mixed hyperlipidemia: Secondary | ICD-10-CM

## 2018-05-01 DIAGNOSIS — Z0000 Encounter for general adult medical examination without abnormal findings: Secondary | ICD-10-CM | POA: Diagnosis not present

## 2018-05-01 LAB — COMPREHENSIVE METABOLIC PANEL
ALT: 12 U/L (ref 0–35)
AST: 23 U/L (ref 0–37)
Albumin: 4.2 g/dL (ref 3.5–5.2)
Alkaline Phosphatase: 64 U/L (ref 39–117)
BUN: 13 mg/dL (ref 6–23)
CO2: 30 mEq/L (ref 19–32)
CREATININE: 0.78 mg/dL (ref 0.40–1.20)
Calcium: 9.5 mg/dL (ref 8.4–10.5)
Chloride: 105 mEq/L (ref 96–112)
GFR: 79.22 mL/min (ref >60.00)
GLUCOSE: 86 mg/dL (ref 70–99)
Potassium: 4.4 mEq/L (ref 3.5–5.1)
SODIUM: 142 meq/L (ref 135–145)
TOTAL PROTEIN: 7.5 g/dL (ref 6.0–8.3)
Total Bilirubin: 0.4 mg/dL (ref 0.2–1.2)

## 2018-05-01 LAB — LIPID PANEL
CHOLESTEROL: 210 mg/dL — AB (ref 0–200)
HDL: 75.8 mg/dL (ref >39.00)
LDL CALC: 122 mg/dL — AB (ref 0–99)
NonHDL: 133.75
TRIGLYCERIDES: 60 mg/dL (ref 0.0–149.0)
Total CHOL/HDL Ratio: 3
VLDL: 12 mg/dL (ref 0.0–40.0)

## 2018-05-01 LAB — CBC WITH DIFFERENTIAL/PLATELET
BASOS PCT: 0.5 % (ref 0.0–3.0)
Basophils Absolute: 0 10*3/uL (ref 0.0–0.1)
EOS ABS: 0.1 10*3/uL (ref 0.0–0.7)
Eosinophils Relative: 1.8 % (ref 0.0–5.0)
HCT: 37.9 % (ref 36.0–46.0)
HEMOGLOBIN: 12.8 g/dL (ref 12.0–15.0)
LYMPHS ABS: 2.5 10*3/uL (ref 0.7–4.0)
Lymphocytes Relative: 45.9 % (ref 12.0–46.0)
MCHC: 33.9 g/dL (ref 30.0–36.0)
MCV: 91.8 fl (ref 78.0–100.0)
MONO ABS: 0.6 10*3/uL (ref 0.1–1.0)
Monocytes Relative: 10.7 % (ref 3.0–12.0)
NEUTROS PCT: 41.1 % — AB (ref 43.0–77.0)
Neutro Abs: 2.2 10*3/uL (ref 1.4–7.7)
Platelets: 302 10*3/uL (ref 150.0–400.0)
RBC: 4.13 Mil/uL (ref 3.87–5.11)
RDW: 12.5 % (ref 11.5–15.5)
WBC: 5.3 10*3/uL (ref 4.0–10.5)

## 2018-05-01 LAB — TSH: TSH: 3.16 u[IU]/mL (ref 0.35–4.50)

## 2018-05-03 DIAGNOSIS — E785 Hyperlipidemia, unspecified: Secondary | ICD-10-CM | POA: Insufficient documentation

## 2018-05-03 NOTE — Assessment & Plan Note (Signed)
2019 fasting labs reviewed,  10 yr risk of CAD is 3%

## 2018-05-04 ENCOUNTER — Encounter: Payer: Self-pay | Admitting: Internal Medicine

## 2018-05-04 ENCOUNTER — Ambulatory Visit (INDEPENDENT_AMBULATORY_CARE_PROVIDER_SITE_OTHER): Payer: PPO | Admitting: Internal Medicine

## 2018-05-04 VITALS — BP 104/68 | HR 82 | Temp 97.9°F | Resp 16 | Ht 63.0 in | Wt 167.6 lb

## 2018-05-04 DIAGNOSIS — Z1239 Encounter for other screening for malignant neoplasm of breast: Secondary | ICD-10-CM

## 2018-05-04 DIAGNOSIS — H3552 Pigmentary retinal dystrophy: Secondary | ICD-10-CM

## 2018-05-04 DIAGNOSIS — E663 Overweight: Secondary | ICD-10-CM | POA: Diagnosis not present

## 2018-05-04 DIAGNOSIS — E782 Mixed hyperlipidemia: Secondary | ICD-10-CM | POA: Diagnosis not present

## 2018-05-04 DIAGNOSIS — H903 Sensorineural hearing loss, bilateral: Secondary | ICD-10-CM

## 2018-05-04 DIAGNOSIS — Z0000 Encounter for general adult medical examination without abnormal findings: Secondary | ICD-10-CM | POA: Diagnosis not present

## 2018-05-04 NOTE — Patient Instructions (Addendum)
Your CBC, thyroid , cholesterol,  liver and kidney function are normal.    You need 60 ounces of water daily  and 25  grams of fiber daily for good bowel regimen   Ok to substitute coke zero,  not more than one bottle daily   [place] whole wheat tortillas  Are great tasting and Have 25 g per serving ("Carb Balance ")   Start exercising for 15 minutes daily and gradually increase to 30 minutes   I am making a referral to [facility] to have the blood vessels in your legs examined   Your annual mammogram has been ordered.  You will need to call the facility  to make your appointment at [facility]   Health Maintenance for Postmenopausal Women Menopause is a normal process in which your reproductive ability comes to an end. This process happens gradually over a span of months to years, usually between the ages of 54 and 31. Menopause is complete when you have missed 12 consecutive menstrual periods. It is important to talk with your health care provider about some of the most common conditions that affect postmenopausal women, such as heart disease, cancer, and bone loss (osteoporosis). Adopting a healthy lifestyle and getting preventive care can help to promote your health and wellness. Those actions can also lower your chances of developing some of these common conditions. What should I know about menopause? During menopause, you may experience a number of symptoms, such as:  Moderate-to-severe hot flashes.  Night sweats.  Decrease in sex drive.  Mood swings.  Headaches.  Tiredness.  Irritability.  Memory problems.  Insomnia. Choosing to treat or not to treat menopausal changes is an individual decision that you make with your health care provider. What should I know about hormone replacement therapy and supplements? Hormone therapy products are effective for treating symptoms that are associated with menopause, such as hot flashes and night sweats.  Hormone replacement carries certain risks, especially as you become older. If you are thinking about using estrogen or estrogen with progestin treatments, discuss the benefits and risks with your health care provider. What should I know about heart disease and stroke? Heart disease, heart attack, and stroke become more likely as you age. This may be due, in part, to the hormonal changes that your body experiences during menopause. These can affect how your body processes dietary fats, triglycerides, and cholesterol. Heart attack and stroke are both medical emergencies. There are many things that you can do to help prevent heart disease and stroke:  Have your blood pressure checked at least every 1-2 years. High blood pressure causes heart disease and increases the risk of stroke.  If you are 61-74 years old, ask your health care provider if you should take aspirin to prevent a heart attack or a stroke.  Do not use any tobacco products, including cigarettes, chewing tobacco, or electronic cigarettes. If you need help quitting, ask your health care provider.  It is important to eat a healthy diet and maintain a healthy weight. ? Be sure to include plenty of vegetables, fruits, low-fat dairy products, and lean protein. ? Avoid eating foods that are high in solid fats, added sugars, or salt (sodium).  Get regular exercise. This is one of the most important things that you can do for your health. ? Try to exercise for at least 150 minutes each week. The type of exercise that you do should increase your heart rate and make you sweat.  This is known as moderate-intensity exercise. ? Try to do strengthening exercises at least twice each week. Do these in addition to the moderate-intensity exercise.  Know your numbers.Ask your health care provider to check your cholesterol and your blood glucose. Continue to have your blood tested as directed by your health care provider.  What should I know about cancer  screening? There are several types of cancer. Take the following steps to reduce your risk and to catch any cancer development as early as possible. Breast Cancer  Practice breast self-awareness. ? This means understanding how your breasts normally appear and feel. ? It also means doing regular breast self-exams. Let your health care provider know about any changes, no matter how small.  If you are 64 or older, have a clinician do a breast exam (clinical breast exam or CBE) every year. Depending on your age, family history, and medical history, it may be recommended that you also have a yearly breast X-ray (mammogram).  If you have a family history of breast cancer, talk with your health care provider about genetic screening.  If you are at high risk for breast cancer, talk with your health care provider about having an MRI and a mammogram every year.  Breast cancer (BRCA) gene test is recommended for women who have family members with BRCA-related cancers. Results of the assessment will determine the need for genetic counseling and BRCA1 and for BRCA2 testing. BRCA-related cancers include these types: ? Breast. This occurs in males or females. ? Ovarian. ? Tubal. This may also be called fallopian tube cancer. ? Cancer of the abdominal or pelvic lining (peritoneal cancer). ? Prostate. ? Pancreatic. Cervical, Uterine, and Ovarian Cancer Your health care provider may recommend that you be screened regularly for cancer of the pelvic organs. These include your ovaries, uterus, and vagina. This screening involves a pelvic exam, which includes checking for microscopic changes to the surface of your cervix (Pap test).  For women ages 21-65, health care providers may recommend a pelvic exam and a Pap test every three years. For women ages 41-65, they may recommend the Pap test and pelvic exam, combined with testing for human papilloma virus (HPV), every five years. Some types of HPV increase your  risk of cervical cancer. Testing for HPV may also be done on women of any age who have unclear Pap test results.  Other health care providers may not recommend any screening for nonpregnant women who are considered low risk for pelvic cancer and have no symptoms. Ask your health care provider if a screening pelvic exam is right for you.  If you have had past treatment for cervical cancer or a condition that could lead to cancer, you need Pap tests and screening for cancer for at least 20 years after your treatment. If Pap tests have been discontinued for you, your risk factors (such as having a new sexual partner) need to be reassessed to determine if you should start having screenings again. Some women have medical problems that increase the chance of getting cervical cancer. In these cases, your health care provider may recommend that you have screening and Pap tests more often.  If you have a family history of uterine cancer or ovarian cancer, talk with your health care provider about genetic screening.  If you have vaginal bleeding after reaching menopause, tell your health care provider.  There are currently no reliable tests available to screen for ovarian cancer. Lung Cancer Lung cancer screening is recommended for adults  49-59 years old who are at high risk for lung cancer because of a history of smoking. A yearly low-dose CT scan of the lungs is recommended if you:  Currently smoke.  Have a history of at least 30 pack-years of smoking and you currently smoke or have quit within the past 15 years. A pack-year is smoking an average of one pack of cigarettes per day for one year. Yearly screening should:  Continue until it has been 15 years since you quit.  Stop if you develop a health problem that would prevent you from having lung cancer treatment. Colorectal Cancer  This type of cancer can be detected and can often be prevented.  Routine colorectal cancer screening usually begins  at age 26 and continues through age 91.  If you have risk factors for colon cancer, your health care provider may recommend that you be screened at an earlier age.  If you have a family history of colorectal cancer, talk with your health care provider about genetic screening.  Your health care provider may also recommend using home test kits to check for hidden blood in your stool.  A small camera at the end of a tube can be used to examine your colon directly (sigmoidoscopy or colonoscopy). This is done to check for the earliest forms of colorectal cancer.  Direct examination of the colon should be repeated every 5-10 years until age 9. However, if early forms of precancerous polyps or small growths are found or if you have a family history or genetic risk for colorectal cancer, you may need to be screened more often. Skin Cancer  Check your skin from head to toe regularly.  Monitor any moles. Be sure to tell your health care provider: ? About any new moles or changes in moles, especially if there is a change in a mole's shape or color. ? If you have a mole that is larger than the size of a pencil eraser.  If any of your family members has a history of skin cancer, especially at a young age, talk with your health care provider about genetic screening.  Always use sunscreen. Apply sunscreen liberally and repeatedly throughout the day.  Whenever you are outside, protect yourself by wearing long sleeves, pants, a wide-brimmed hat, and sunglasses. What should I know about osteoporosis? Osteoporosis is a condition in which bone destruction happens more quickly than new bone creation. After menopause, you may be at an increased risk for osteoporosis. To help prevent osteoporosis or the bone fractures that can happen because of osteoporosis, the following is recommended:  If you are 23-28 years old, get at least 1,000 mg of calcium and at least 600 mg of vitamin D per day.  If you are older  than age 28 but younger than age 106, get at least 1,200 mg of calcium and at least 600 mg of vitamin D per day.  If you are older than age 79, get at least 1,200 mg of calcium and at least 800 mg of vitamin D per day. Smoking and excessive alcohol intake increase the risk of osteoporosis. Eat foods that are rich in calcium and vitamin D, and do weight-bearing exercises several times each week as directed by your health care provider. What should I know about how menopause affects my mental health? Depression may occur at any age, but it is more common as you become older. Common symptoms of depression include:  Low or sad mood.  Changes in sleep patterns.  Changes  in appetite or eating patterns.  Feeling an overall lack of motivation or enjoyment of activities that you previously enjoyed.  Frequent crying spells. Talk with your health care provider if you think that you are experiencing depression. What should I know about immunizations? It is important that you get and maintain your immunizations. These include:  Tetanus, diphtheria, and pertussis (Tdap) booster vaccine.  Influenza every year before the flu season begins.  Pneumonia vaccine.  Shingles vaccine. Your health care provider may also recommend other immunizations. This information is not intended to replace advice given to you by your health care provider. Make sure you discuss any questions you have with your health care provider. Document Released: 06/14/2005 Document Revised: 11/10/2015 Document Reviewed: 01/24/2015 Elsevier Interactive Patient Education  2019 [employer].

## 2018-05-04 NOTE — Progress Notes (Signed)
Patient ID: Helen Mccoy, female    DOB: November 01, 1955  Age: 62 y.o. MRN: [id number]  The patient is here for annual preventive examination and management of other chronic and acute problems. Helen Mccoy is accompanied by Helen Mccoy mother and Helen Mccoy translator. 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  The roster of all physicians providing medical care to patient - is listed in the Snapshot section of the chart.  Activities of daily living:  The patient is  Not 100% independent in all ADLs  due to being blind and deaf.   Home safety : The patient has smoke detectors in the home. They wear seatbelts.  There are no firearms at home. There is no violence in the home.   There is no risks for hepatitis, STDs or HIV. There is no   history of blood transfusion. They have no travel history to infectious disease endemic areas of the world.  The patient has seen their dentist in the last six month.     They do not  have excessive sun exposure. Discussed the need for sun protection: hats, long sleeves and use of sunscreen if there is significant sun exposure.   Diet: the importance of a healthy diet is discussed. They do have a healthy diet.  The benefits of regular aerobic exercise were discussed. Helen Mccoy doe snot exercise regularly .   Depression screen: there are no signs or vegative symptoms of depression- irritability, change in appetite, anhedonia, sadness/tearfullness.  Cognitive assessment: the patient has assistance managing Helen Mccoy financial and personal affairs  Due to Helen Mccoy condition but  is actively engaged. They could relate day,date,year and events; recalled 2/3 objects at 3 minutes; performed clock-face test normally.  The following portions of the patient's history were reviewed and updated as appropriate: allergies, current medications, past family history, past medical history,  past surgical history, past social history  and problem  list.  Visual acuity and hearing were  not assessed .  body mass index were assessed and reviewed.   During the course of the visit the patient was educated and counseled about appropriate screening and preventive services including : fall prevention , diabetes screening, nutrition counseling, colorectal cancer screening, and recommended immunizations.    CC: The primary encounter diagnosis was Breast cancer screening. Diagnoses of Overweight (BMI 25.0-29.9), Visit for preventive health examination, Usher's syndrome, and Moderate mixed hyperlipidemia not requiring statin therapy were also pertinent to this visit.  10 overweight: patient was counselled in detail about the role of exercise and diet in achieving a more optimal weight  History Helen Mccoy has a past medical history of Blind and Deafness (9 /24/1956).   Helen Mccoy has a past surgical history that includes Tonsilectomy, adenoidectomy, bilateral myringotomy and tubes (Bilateral, 1968); Abdominal hysterectomy (1986); Cesarean section; Cosmetic surgery; and Tubal ligation.   Helen Mccoy family history includes Arthritis in Helen Mccoy mother; Breast cancer (age of onset: 61) in Helen Mccoy mother; Cancer in Helen Mccoy mother; Hypertension in Helen Mccoy father; Macular degeneration in Helen Mccoy brother; Stroke in Helen Mccoy father.Helen Mccoy reports that Helen Mccoy quit smoking about 19 years ago. Helen Mccoy smoking use included cigarettes. Helen Mccoy smoked 1.00 pack per day. Helen Mccoy has never used smokeless tobacco. Helen Mccoy reports current alcohol use. Helen Mccoy reports that Helen Mccoy does not use drugs.  Outpatient Medications Prior to Visit  Medication Sig Dispense Refill  . Calcium Carbonate-Vitamin D (CALCIUM 500 + D) 500-125 MG-UNIT TABS Take 1 tablet by mouth daily.    Marland Kitchen  meloxicam (MOBIC) 15 MG tablet Take 1 tablet (15 mg total) by mouth daily. (Patient not taking: Reported on 05/04/2018) 30 tablet 0   No facility-administered medications prior to visit.     Review of Systems   Per translator, Patient denies headache, fevers,  malaise, unintentional weight loss, skin rash, eye pain, sinus congestion and sinus pain, sore throat, dysphagia,  hemoptysis , cough, dyspnea, wheezing, chest pain, palpitations, orthopnea, edema, abdominal pain, nausea, melena, diarrhea,  flank pain, dysuria, hematuria, urinary  Frequency, nocturia, numbness, tingling, seizures,  Focal weakness, Loss of consciousness,  Tremor, insomnia, depression, anxiety, and suicidal ideation.      Objective:  BP 104/68 (BP Location: Left Arm, Patient Position: Sitting, Cuff Size: Normal)   Pulse 82   Temp 97.9 F (36.6 C) (Oral)   Resp 16   Ht 5\' 3"  (1.6 m)   Wt 167 lb 9.6 oz (76 kg)   SpO2 97%   BMI 29.69 kg/m   Physical Exam  General appearance: alert, cooperative and appears stated age Head: Normocephalic, without obvious abnormality, atraumatic Eyes: conjunctivae/corneas clear. PERRL, EOM's intact. Fundi benign. Ears: normal TM's and external ear canals both ears Nose: Nares normal. Septum midline. Mucosa normal. No drainage or sinus tenderness. Throat: lips, mucosa, and tongue normal; teeth and gums normal Neck: no adenopathy, no carotid bruit, no JVD, supple, symmetrical, trachea midline and thyroid not enlarged, symmetric, no tenderness/mass/nodules Lungs: clear to auscultation bilaterally Breasts: normal appearance, no masses or tenderness Heart: regular rate and rhythm, S1, S2 normal, no murmur, click, rub or gallop Abdomen: soft, non-tender; bowel sounds normal; no masses,  no organomegaly Extremities: extremities normal, atraumatic, no cyanosis or edema Pulses: 2+ and symmetric Skin: Skin color, texture, turgor normal. No rashes or lesions Neurologic: Alert and oriented X 3, normal strength and tone. Normal symmetric reflexes. Normal coordination and gait.     Assessment & Plan:   Problem List Items Addressed This Visit    Hyperlipidemia    Using the Framingham risk calculator,  Helen Mccoy 10 year risk of coronary artery disease is  4%. No treatment offered  Lab Results  Component Value Date   CHOL 210 (H) 05/01/2018   HDL 75.80 05/01/2018   LDLCALC 122 (H) 05/01/2018   LDLDIRECT 125.0 04/28/2015   TRIG 60.0 05/01/2018   CHOLHDL 3 05/01/2018         Overweight (BMI 25.0-29.9)    I have addressed  BMI and recommended wt loss of 10% of body weight over the next 6 months using a low glycemic index diet and regular exercise a minimum of 5 days per week.  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  Employs palpable sign language for communication       Visit for preventive health examination    Annual comprehensive preventive exam was done as well as an evaluation and management of chronic conditions .  During the course of the visit  the patient was educated and counseled about appropriate screening and preventive services including :  diabetes screening, lipid analysis with projected  10 year  risk for CAD , nutrition counseling, breast, cervical and colorectal cancer screening, and recommended immunizations.  Printed recommendations for health maintenance screenings was given       Other Visit Diagnoses    Breast cancer screening    -  Primary   Relevant Orders   MM 3D SCREEN BREAST BILATERAL      I have discontinued Avalie C. Belton's meloxicam. I am also having Helen Mccoy maintain Helen Mccoy Calcium Carbonate-Vitamin D.  No orders of the defined types were placed in this encounter.   Medications Discontinued During This Encounter  Medication Reason   . meloxicam (MOBIC) 15 MG tablet     Follow-up: Return in about 6 months (around 11/03/2018).   Crecencio Mc, MD

## 2018-05-06 ENCOUNTER — Encounter: Payer: Self-pay | Admitting: Internal Medicine

## 2018-05-06 NOTE — Assessment & Plan Note (Signed)
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  Employs palpable sign language for communication  

## 2018-05-06 NOTE — Assessment & Plan Note (Signed)
Using the Framingham risk calculator,  her 10 year risk of coronary artery disease is 4%. No treatment offered  Lab Results  Component Value Date   CHOL 210 (H) 05/01/2018   HDL 75.80 05/01/2018   LDLCALC 122 (H) 05/01/2018   LDLDIRECT 125.0 04/28/2015   TRIG 60.0 05/01/2018   CHOLHDL 3 05/01/2018

## 2018-05-06 NOTE — Assessment & Plan Note (Signed)
Annual comprehensive preventive exam was done as well as an evaluation and management of chronic conditions .  During the course of the visit the patient was educated and counseled about appropriate screening and preventive services including :  diabetes screening, lipid analysis with projected  10 year  risk for CAD , nutrition counseling, breast, cervical and colorectal cancer screening, and recommended immunizations.  Printed recommendations for health maintenance screenings was given 

## 2018-05-06 NOTE — Assessment & Plan Note (Signed)
I have addressed  BMI and recommended wt loss of 10% of body weight over the next 6 months using a low glycemic index diet and regular exercise a minimum of 5 days per week.  She has no evidence of thyroid disorder,  Diabetes or hyperlipidemia by today's labs   Lab Results  Component Value Date   TSH 3.16 05/01/2018   Lab Results  Component Value Date   CHOL 210 (H) 05/01/2018   HDL 75.80 05/01/2018   LDLCALC 122 (H) 05/01/2018   LDLDIRECT 125.0 04/28/2015   TRIG 60.0 05/01/2018   CHOLHDL 3 05/01/2018   No results found for: HGBA1C

## 2018-06-22 ENCOUNTER — Ambulatory Visit
Admission: RE | Admit: 2018-06-22 | Discharge: 2018-06-22 | Disposition: A | Discharge status: Home / Self Care | Payer: PPO | Source: Ambulatory Visit | Attending: Internal Medicine | Admitting: Internal Medicine

## 2018-06-22 DIAGNOSIS — Z1239 Encounter for other screening for malignant neoplasm of breast: Secondary | ICD-10-CM

## 2018-06-22 DIAGNOSIS — Z1231 Encounter for screening mammogram for malignant neoplasm of breast: Secondary | ICD-10-CM | POA: Diagnosis not present

## 2018-10-14 ENCOUNTER — Telehealth: Payer: Self-pay | Admitting: Internal Medicine

## 2018-10-14 NOTE — Telephone Encounter (Signed)
Pt's mother called, she wants to bring pt into office for a rash. Where could we schdule her? Pt's mother is requesting that Dr. Derrel Nip calls her.

## 2018-10-20 NOTE — Telephone Encounter (Signed)
Spoke with pt's mother and pt is scheduled for a telephone visit with Dr. Derrel Nip for her to speak with pt's mother in regards to pt's rash. Pt is blind and deaf.

## 2018-10-22 ENCOUNTER — Encounter: Payer: Self-pay | Admitting: Internal Medicine

## 2018-10-22 ENCOUNTER — Ambulatory Visit (INDEPENDENT_AMBULATORY_CARE_PROVIDER_SITE_OTHER): Payer: PPO | Admitting: Internal Medicine

## 2018-10-22 ENCOUNTER — Other Ambulatory Visit: Payer: Self-pay

## 2018-10-22 DIAGNOSIS — L298 Other pruritus: Secondary | ICD-10-CM | POA: Diagnosis not present

## 2018-10-22 MED ORDER — PREDNISONE 10 MG PO TABS: ORAL_TABLET | ORAL | 0 refills | Status: DC

## 2018-10-22 MED ORDER — FEXOFENADINE HCL 180 MG PO TABS
180.0000 mg | ORAL_TABLET | Freq: Every day | ORAL | 1 refills | Status: DC
Start: 2018-10-22 — End: 2018-11-03

## 2018-10-22 NOTE — Patient Instructions (Signed)
I AM PRESCRIBING A PREDNISONE TAPER TO TAKE FOR 6 DAYS:  6 tablets all at once on Day 1,  Then taper by 1 tablet daily until gone  PLEASE START THE FEXOFENADINE ("ALLEGRA") ALSO  .  IT CAN BE TAKEN ONCE DAILY   OR UP TO 4 TIMES DAILY IF NEEDED FOR ITCHING    Make sure that Helen Mccoy is not using any scented  Or detergent soaps or new products on her hair that she might be allergic to.    She can use benadryl at night if the itching is keeping her awake

## 2018-10-22 NOTE — Progress Notes (Signed)
Pt's mother stated that the rash starts at the pt's ears and come around to the front of her neck and down her chest. The rash is pink in color, very itchy, dry and scaly. The mother that the the pt told her that its like her skin is peeling. Pt has tried several OTC creams and nothing seems to have helped.

## 2018-10-22 NOTE — Progress Notes (Signed)
Telephone Note  This visit type was conducted due to national recommendations for restrictions regarding the COVID-19 pandemic (e.g. social distancing).  This format is felt to be most appropriate for this patient at this time.  All issues noted in this document were discussed and addressed.  No physical exam was performed (except for noted visual exam findings with Video Visits).   I connected with@ on 10/22/18 at  8:00 AM EDT by telephone with Rohini's mother (patient cannot communicate because she is deaf and blind)  and verified that I am speaking with the correct person using two identifiers. Location patient: home Location provider: work or home office Persons participating in the virtual visit: patient, provider  I discussed the limitations, risks, security and privacy concerns of performing an evaluation and management service by telephone and the availability of in person appointments. I also discussed with the patient that there may be a patient responsible charge related to this service. The patient expressed understanding and agreed to proceed.  Reason for visit: persistent neck rash  HPI:  Patient has had a pruritic rash affecting the skin behind her ears and covering her neck for nearly 5 months, according to her mother.  She has noticed some desquamation of the skin behind her ears,  But nowhere else,  The rash has not spread beyond the head and neck.  It is pruritic ,  And transiently relieved during and after a shower.   She has had no recent medication changes.  No changes in detergents or soaps   No new jewelry ,.  No changes in diet.  No headaches,  Fevers or cough .  No recent travel.     ROS: See pertinent positives and negatives per HPI.  Past Medical History:  Diagnosis Date  . Blind    retinitis pigmentosia started at 63 years of age  . Deafness 9 /24/1956   bilateral    Past Surgical History:  Procedure Laterality Date  . ABDOMINAL HYSTERECTOMY  1986   nonmalignant reasons  . CESAREAN SECTION    . COSMETIC SURGERY    . TONSILECTOMY, ADENOIDECTOMY, BILATERAL MYRINGOTOMY AND TUBES Bilateral 1968  . TUBAL LIGATION      Family History  Problem Relation Age of Onset  . Arthritis Mother   . Cancer Mother   . Breast cancer Mother 69  . Hypertension Father   . Stroke Father   . Macular degeneration Brother     SOCIAL HX:  reports that she quit smoking about 19 years ago. Her smoking use included cigarettes. She smoked 1.00 pack per day. She has never used smokeless tobacco. She reports current alcohol use. She reports that she does not use drugs.   Current Outpatient Medications:  .  Calcium Carbonate-Vitamin D (CALCIUM 500 + D) 500-125 MG-UNIT TABS, Take 1 tablet by mouth daily., Disp: , Rfl:  .  fexofenadine (ALLEGRA) 180 MG tablet, Take 1 tablet (180 mg total) by mouth daily., Disp: 90 tablet, Rfl: 1 .  predniSONE (DELTASONE) 10 MG tablet, 6 tablets on Day 1 , then reduce by 1 tablet daily until gone, Disp: 21 tablet, Rfl: 0  EXAM:  Due to patient's preexisting disabilities,  No physical exam or attributes could be determined other than her mother's report of the following  :  General impression: alert, cooperative .  No signs of being in distress  Lungs: patient did not appear to be  short of breath and was not coughing ,  sneezing or sniffing.  Skin:  the skin of her neck was faintly pink with no pustules papules or macules. Some peeling skin was noted behind her ears  Psych: her affect was normal.       ASSESSMENT AND PLAN:  Discussed the following assessment and plan:  Pruritic erythematous rash etiology unclear.  History provides no clues except to suggest a contact dermatitis from some ongoing exposure.  Unfortunately neither the patient or her mother  Has a cellular phone to provide a picture of the rash.  Will treat empirically with a prednisone taper and a daily antihistamine.  If no improvement in 2 weeks,  Will schedule  a FTF  Visit     I discussed the assessment and treatment plan with the patient. The patient was provided an opportunity to ask questions and all were answered. The patient agreed with the plan and demonstrated an understanding of the instructions.   The patient was advised to call back or seek an in-person evaluation if the symptoms worsen or if the condition fails to improve as anticipated.  I provided  15  minutes of non-face-to-face time during this encounter.   Crecencio Mc, MD

## 2018-10-24 DIAGNOSIS — L298 Other pruritus: Secondary | ICD-10-CM | POA: Insufficient documentation

## 2018-10-24 DIAGNOSIS — L2989 Other pruritus: Secondary | ICD-10-CM | POA: Insufficient documentation

## 2018-10-24 NOTE — Assessment & Plan Note (Addendum)
etiology unclear.  History provides no clues except to suggest a contact dermatitis from some ongoing exposure.  Unfortunately neither the patient or her mother  Has a cellular phone to provide a picture of the rash.  Will treat empirically with a prednisone taper and a daily antihistamine.  If no improvement in 2 weeks,  Will schedule a FTF  Visit

## 2018-11-02 ENCOUNTER — Other Ambulatory Visit: Payer: Self-pay

## 2018-11-03 ENCOUNTER — Other Ambulatory Visit: Payer: Self-pay

## 2018-11-03 ENCOUNTER — Encounter: Payer: Self-pay | Admitting: Internal Medicine

## 2018-11-03 ENCOUNTER — Ambulatory Visit (INDEPENDENT_AMBULATORY_CARE_PROVIDER_SITE_OTHER): Payer: PPO | Admitting: Internal Medicine

## 2018-11-03 VITALS — BP 106/64 | HR 77 | Temp 98.1°F | Resp 15 | Ht 63.0 in | Wt 171.4 lb

## 2018-11-03 DIAGNOSIS — L298 Other pruritus: Secondary | ICD-10-CM

## 2018-11-03 DIAGNOSIS — I872 Venous insufficiency (chronic) (peripheral): Secondary | ICD-10-CM

## 2018-11-03 DIAGNOSIS — L309 Dermatitis, unspecified: Secondary | ICD-10-CM

## 2018-11-03 MED ORDER — FEXOFENADINE HCL 180 MG PO TABS: 180.0000 mg | ORAL_TABLET | Freq: Two times a day (BID) | ORAL | 1 refills | Status: DC

## 2018-11-03 MED ORDER — TRIAMCINOLONE ACETONIDE 0.1 % EX CREA: 1.0000 "application " | TOPICAL_CREAM | Freq: Two times a day (BID) | CUTANEOUS | 0 refills | Status: DC

## 2018-11-03 NOTE — Progress Notes (Signed)
Subjective:  Patient ID: Helen Mccoy, female    DOB: 02-22-1956  Age: 63 y.o. MRN: [id number]  CC: The primary encounter diagnosis was Dermatitis. Diagnoses of Venous stasis dermatitis of both lower extremities and Pruritic erythematous rash were also pertinent to this visit.  HPI MACEE VENABLES presents for 6 month follow up on chronic conditions . 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  The rash had been present for 4 months (information provided by mother who is present) and involved the  skin behind her ears,  On her face,  And neck . The rash was pruritic and the skin would occasionally peel. There had been no recent change in shampoo, detergent, soap or moisturizer. Patient does not wear jewelry  She was prescribed fexofenadine once daily and a 6 day prednisone  Taper.  She finished both medications.  She states that the  Rash transiently improved while taking the prednisone but has begun to return.  She has stopped taking the antihistamine  as well   She states that she will develop blisters that then peel   2) still having bilateral venus stasis changes bilaterally  On the medial side of each leg just above the ankle.  She did not tolerate a trial of her mother's compression socks which were 30 mm hg. She sleeps on her side and the ankles become tender when they contact each other.   Outpatient Medications Prior to Visit  Medication Sig Dispense Refill  . Calcium Carbonate-Vitamin D (CALCIUM 500 + D) 500-125 MG-UNIT TABS Take 1 tablet by mouth daily.    . fexofenadine (ALLEGRA) 180 MG tablet Take 1 tablet (180 mg total) by mouth daily. 90 tablet 1  . predniSONE (DELTASONE) 10 MG tablet 6 tablets on Day 1 , then reduce by 1 tablet daily until gone (Patient not taking: Reported on 11/03/2018) 21 tablet 0   No facility-administered  medications prior to visit.     Review of Systems;  Patient denies headache, fevers, malaise, unintentional weight loss, skin rash, eye pain, sinus congestion and sinus pain, sore throat, dysphagia,  hemoptysis , cough, dyspnea, wheezing, chest pain, palpitations, orthopnea, edema, abdominal pain, nausea, melena, diarrhea, constipation, flank pain, dysuria, hematuria, urinary  Frequency, nocturia, numbness, tingling, seizures,  Focal weakness, Loss of consciousness,  Tremor, insomnia, depression, anxiety, and suicidal ideation.      Objective:  BP 106/64 (BP Location: Left Arm, Patient Position: Sitting, Cuff Size: Normal)   Pulse 77   Temp 98.1 F (36.7 C) (Oral)   Resp 15   Ht 5\' 3"  (1.6 m)   Wt 171 lb 6.4 oz (77.7 kg)   SpO2 96%   BMI 30.36 kg/m   BP Readings from Last 3 Encounters:  11/03/18 106/64  05/04/18 104/68  01/09/18 124/68    Wt Readings from Last 3 Encounters:  11/03/18 171 lb 6.4 oz (77.7 kg)  05/04/18 167 lb 9.6 oz (76 kg)  01/09/18 165 lb (74.8 kg)    General appearance: alert, cooperative and appears stated age Ears: normal TM's and external ear canals both ears Throat: lips, mucosa, and tongue normal; teeth and gums normal Neck: no adenopathy, no carotid bruit, supple, symmetrical, trachea midline and thyroid not enlarged, symmetric, no tenderness/mass/nodules Back: symmetric, no curvature. ROM normal. No CVA tenderness. Lungs: clear to auscultation bilaterally Heart: regular  rate and rhythm, S1, S2 normal, no murmur, click, rub or gallop Abdomen: soft, non-tender; bowel sounds normal; no masses,  no organomegaly Pulses: 2+ and symmetric Skin: Skin surrounding  ears pink, mildly hyperpigmented flat topped papules in front of ears , turgor normal. Hyperpigmented macular rash on medial side of both lower extremities above the ankle. Diffuse spider veins distal to ankle bilaterally    Lymph nodes: Cervical, supraclavicular, and axillary nodes normal.  No  results found for: HGBA1C  Lab Results  Component Value Date   CREATININE 0.78 05/01/2018   CREATININE 0.71 05/01/2017   CREATININE 0.67 05/01/2016    Lab Results  Component Value Date   WBC 5.3 05/01/2018   HGB 12.8 05/01/2018   HCT 37.9 05/01/2018   PLT 302.0 05/01/2018   GLUCOSE 86 05/01/2018   CHOL 210 (H) 05/01/2018   TRIG 60.0 05/01/2018   HDL 75.80 05/01/2018   LDLDIRECT 125.0 04/28/2015   LDLCALC 122 (H) 05/01/2018   ALT 12 05/01/2018   AST 23 05/01/2018   NA 142 05/01/2018   K 4.4 05/01/2018   CL 105 05/01/2018   CREATININE 0.78 05/01/2018   BUN 13 05/01/2018   CO2 30 05/01/2018   TSH 3.16 05/01/2018    Mm 3d Screen Breast Bilateral  Result Date: 06/22/2018 CLINICAL DATA:  Screening. EXAM: DIGITAL SCREENING BILATERAL MAMMOGRAM WITH TOMO AND CAD COMPARISON:  Previous exam(s). ACR Breast Density Category b: There are scattered areas of fibroglandular density. FINDINGS: There are no findings suspicious for malignancy. Images were processed with CAD. IMPRESSION: No mammographic evidence of malignancy. A result letter of this screening mammogram will be mailed directly to the patient. RECOMMENDATION: Screening mammogram in one year. (Code:SM-B-01Y) BI-RADS CATEGORY  1: Negative. Electronically Signed   By: Ammie Ferrier M.D.   On: 06/22/2018 10:28    Assessment & Plan:   Problem List Items Addressed This Visit    Pruritic erythematous rash    Etiology unclear.. resolved with short prednisone taper but has returned.  Resume fexofenadine bid and add triamcinolone cream.  CBC ordered.  If rash does not resolve will repeat the prednisone taper         Venous stasis dermatitis of both lower extremities - Primary    Advised to try lower weight compression knee highs and to use a small pillow to cushion the ankles during sleep.  If not tolerated,  Refer to [facility] for treatment with ablation          I have discontinued Regene C. Delacruz's predniSONE. I have also  changed her fexofenadine. Additionally, I am having her start on triamcinolone cream. Lastly, I am having her maintain her Calcium Carbonate-Vitamin D.  Meds ordered this encounter  Medications  . triamcinolone cream (KENALOG) 0.1 %    Sig: Apply 1 application topically 2 (two) times daily.    Dispense:  30 g    Refill:  0  . fexofenadine (ALLEGRA) 180 MG tablet    Sig: Take 1 tablet (180 mg total) by mouth 2 (two) times daily at 8 am and 10 pm.    Dispense:  180 tablet    Refill:  1    Medications Discontinued During This Encounter  Medication Reason  . predniSONE (DELTASONE) 10 MG tablet Completed Course  . fexofenadine (ALLEGRA) 180 MG tablet     Follow-up: No follow-ups on file.   Crecencio Mc, MD

## 2018-11-03 NOTE — Patient Instructions (Addendum)
Her rash appears to be due to some form of allergic reaction or eczema.  Resume  Taking the allegra twice daily   I will also give you a light steroid cream to apply  twice daily .  If the rash does not resolve after one more week,  We will repeat the prednisone   The leg swelling is due to venous insufficiency  If she can tolerate a lighter weight stocking daily,  Great.  If not, we'll refer her to the vein doctors to see if there is a procedure that can be done to close of the incompetent vein    Chronic Venous Insufficiency Chronic venous insufficiency is a condition where the leg veins cannot effectively pump blood from the legs to the heart. This happens when the vein walls are either stretched, weakened, or damaged, or when the valves inside the vein are damaged. With the right treatment, you should be able to continue with an active life. This condition is also called venous stasis. What are the causes? Common causes of this condition include:  High blood pressure inside the veins (venous hypertension).  Sitting or standing too long, causing increased blood pressure in the leg veins.  A blood clot that blocks blood flow in a vein (deep vein thrombosis, DVT).  Inflammation of a vein (phlebitis) that causes a blood clot to form.  Tumors in the pelvis that cause blood to back up. What increases the risk? The following factors may make you more likely to develop this condition:  Having a family history of this condition.  Obesity.  Pregnancy.  Living without enough regular physical activity or exercise (sedentary lifestyle).  Smoking.  Having a job that requires long periods of standing or sitting in one place.  Being a certain age. Women in their 8s and 26s and men in their 41s are more likely to develop this condition. What are the signs or symptoms? Symptoms of this condition include:  Veins that are enlarged, bulging, or twisted (varicose veins).  Skin breakdown  or ulcers.  Reddened skin or dark discoloration of skin on the leg between the knee and ankle.  Brown, smooth, tight, and painful skin just above the ankle, usually on the inside of the leg (lipodermatosclerosis).  Swelling of the legs. How is this diagnosed? This condition may be diagnosed based on:  Your medical history.  A physical exam.  Tests, such as: ? A procedure that creates an image of a blood vessel and nearby organs and provides information about blood flow through the blood vessel (duplex ultrasound). ? A procedure that tests blood flow (plethysmography). ? A procedure that looks at the veins using X-ray and dye (venogram). How is this treated? The goals of treatment are to help you return to an active life and to minimize pain or disability. Treatment depends on the severity of your condition, and it may include:  Wearing compression stockings. These can help relieve symptoms and help prevent your condition from getting worse. However, they do not cure the condition.  Sclerotherapy. This procedure involves an injection of a solution that shrinks damaged veins.  Surgery. This may involve: ? Removing a diseased vein (vein stripping). ? Cutting off blood flow through the vein (laser ablation surgery). ? Repairing or reconstructing a valve within the affected vein. Follow these instructions at home:      Wear compression stockings as told by your health care provider. These stockings help to prevent blood clots and reduce swelling in your legs.  Take over-the-counter and prescription medicines only as told by your health care provider.  Stay active by exercising, walking, or doing different activities. Ask your health care provider what activities are safe for you and how much exercise you need.  Drink enough fluid to keep your urine pale yellow.  Do not use any products that contain nicotine or tobacco, such as cigarettes, e-cigarettes, and chewing tobacco. If you  need help quitting, ask your health care provider.  Keep all follow-up visits as told by your health care provider. This is important. Contact a health care provider if you:  Have redness, swelling, or more pain in the affected area.  See a red streak or line that goes up or down from the affected area.  Have skin breakdown or skin loss in the affected area, even if the breakdown is small.  Get an injury in the affected area. Get help right away if:  You get an injury and an open wound in the affected area.  You have: ? Severe pain that does not get better with medicine. ? Sudden numbness or weakness in the foot or ankle below the affected area. ? Trouble moving your foot or ankle. ? A fever. ? Worse or persistent symptoms. ? Chest pain. ? Shortness of breath. Summary  Chronic venous insufficiency is a condition where the leg veins cannot effectively pump blood from the legs to the heart.  Chronic venous insufficiency occurs when the vein walls become stretched, weakened, or damaged, or when valves within the vein are damaged.  Treatment depends on how severe your condition is. It often involves wearing compression stockings and may involve having a procedure.  Make sure you stay active by exercising, walking, or doing different activities. Ask your health care provider what activities are safe for you and how much exercise you need. This information is not intended to replace advice given to you by your health care provider. Make sure you discuss any questions you have with your health care provider. Document Released: 08/26/2006 Document Revised: 01/13/2018 Document Reviewed: 01/13/2018 Elsevier Patient Education  2020 [employer].

## 2018-11-04 ENCOUNTER — Ambulatory Visit: Payer: PPO | Admitting: Internal Medicine

## 2018-11-04 ENCOUNTER — Encounter: Payer: Self-pay | Admitting: Internal Medicine

## 2018-11-04 DIAGNOSIS — I872 Venous insufficiency (chronic) (peripheral): Secondary | ICD-10-CM | POA: Insufficient documentation

## 2018-11-04 LAB — CBC WITH DIFFERENTIAL/PLATELET
Basophils Absolute: 0 10*3/uL (ref 0.0–0.1)
Basophils Relative: 0.4 % (ref 0.0–3.0)
Eosinophils Absolute: 0.1 10*3/uL (ref 0.0–0.7)
Eosinophils Relative: 1.6 % (ref 0.0–5.0)
HCT: 39.8 % (ref 36.0–46.0)
Hemoglobin: 13.1 g/dL (ref 12.0–15.0)
Lymphocytes Relative: 33.3 % (ref 12.0–46.0)
Lymphs Abs: 2.7 10*3/uL (ref 0.7–4.0)
MCHC: 33 g/dL (ref 30.0–36.0)
MCV: 93.7 fl (ref 78.0–100.0)
Monocytes Absolute: 0.8 10*3/uL (ref 0.1–1.0)
Monocytes Relative: 9.8 % (ref 3.0–12.0)
Neutro Abs: 4.5 10*3/uL (ref 1.4–7.7)
Neutrophils Relative %: 54.9 % (ref 43.0–77.0)
Platelets: 346 10*3/uL (ref 150.0–400.0)
RBC: 4.24 Mil/uL (ref 3.87–5.11)
RDW: 13.1 % (ref 11.5–15.5)
WBC: 8.2 10*3/uL (ref 4.0–10.5)

## 2018-11-04 NOTE — Assessment & Plan Note (Signed)
Advised to try lower weight compression knee highs and to use a small pillow to cushion the ankles during sleep.  If not tolerated,  Refer to [facility] for treatment with ablation

## 2018-11-04 NOTE — Assessment & Plan Note (Signed)
Etiology unclear.. resolved with short prednisone taper but has returned.  Resume fexofenadine bid and add triamcinolone cream.  CBC ordered.  If rash does not resolve will repeat the prednisone taper

## 2018-12-25 NOTE — Telephone Encounter (Signed)
err

## 2019-01-04 ENCOUNTER — Other Ambulatory Visit: Payer: Self-pay | Admitting: Internal Medicine

## 2019-01-04 DIAGNOSIS — L298 Other pruritus: Secondary | ICD-10-CM

## 2019-03-30 DIAGNOSIS — L239 Allergic contact dermatitis, unspecified cause: Secondary | ICD-10-CM | POA: Diagnosis not present

## 2019-04-21 ENCOUNTER — Other Ambulatory Visit: Payer: Self-pay | Admitting: Internal Medicine

## 2019-04-21 DIAGNOSIS — E782 Mixed hyperlipidemia: Secondary | ICD-10-CM

## 2019-04-21 DIAGNOSIS — E559 Vitamin D deficiency, unspecified: Secondary | ICD-10-CM

## 2019-04-21 DIAGNOSIS — E663 Overweight: Secondary | ICD-10-CM

## 2019-04-21 DIAGNOSIS — I872 Venous insufficiency (chronic) (peripheral): Secondary | ICD-10-CM

## 2019-04-28 ENCOUNTER — Other Ambulatory Visit: Payer: Self-pay

## 2019-05-03 ENCOUNTER — Other Ambulatory Visit: Payer: Self-pay

## 2019-05-03 ENCOUNTER — Other Ambulatory Visit (INDEPENDENT_AMBULATORY_CARE_PROVIDER_SITE_OTHER): Payer: PPO

## 2019-05-03 DIAGNOSIS — I872 Venous insufficiency (chronic) (peripheral): Secondary | ICD-10-CM

## 2019-05-03 DIAGNOSIS — E663 Overweight: Secondary | ICD-10-CM | POA: Diagnosis not present

## 2019-05-03 DIAGNOSIS — E559 Vitamin D deficiency, unspecified: Secondary | ICD-10-CM | POA: Diagnosis not present

## 2019-05-03 DIAGNOSIS — E782 Mixed hyperlipidemia: Secondary | ICD-10-CM

## 2019-05-03 LAB — CBC WITH DIFFERENTIAL/PLATELET
Basophils Absolute: 0 10*3/uL (ref 0.0–0.1)
Basophils Relative: 0.4 % (ref 0.0–3.0)
Eosinophils Absolute: 0.1 10*3/uL (ref 0.0–0.7)
Eosinophils Relative: 1.9 % (ref 0.0–5.0)
HCT: 36.8 % (ref 36.0–46.0)
Hemoglobin: 12.3 g/dL (ref 12.0–15.0)
Lymphocytes Relative: 37.9 % (ref 12.0–46.0)
Lymphs Abs: 2.1 10*3/uL (ref 0.7–4.0)
MCHC: 33.3 g/dL (ref 30.0–36.0)
MCV: 92.6 fl (ref 78.0–100.0)
Monocytes Absolute: 0.6 10*3/uL (ref 0.1–1.0)
Monocytes Relative: 10.8 % (ref 3.0–12.0)
Neutro Abs: 2.7 10*3/uL (ref 1.4–7.7)
Neutrophils Relative %: 49 % (ref 43.0–77.0)
Platelets: 303 10*3/uL (ref 150.0–400.0)
RBC: 3.97 Mil/uL (ref 3.87–5.11)
RDW: 12.9 % (ref 11.5–15.5)
WBC: 5.6 10*3/uL (ref 4.0–10.5)

## 2019-05-03 LAB — COMPREHENSIVE METABOLIC PANEL
ALT: 18 U/L (ref 0–35)
AST: 26 U/L (ref 0–37)
Albumin: 4 g/dL (ref 3.5–5.2)
Alkaline Phosphatase: 81 U/L (ref 39–117)
BUN: 15 mg/dL (ref 6–23)
CO2: 30 mEq/L (ref 19–32)
Calcium: 9.1 mg/dL (ref 8.4–10.5)
Chloride: 105 mEq/L (ref 96–112)
Creatinine, Ser: 0.7 mg/dL (ref 0.40–1.20)
GFR: 84.18 mL/min (ref >60.00)
Glucose, Bld: 85 mg/dL (ref 70–99)
Potassium: 4.5 mEq/L (ref 3.5–5.1)
Sodium: 140 mEq/L (ref 135–145)
Total Bilirubin: 0.4 mg/dL (ref 0.2–1.2)
Total Protein: 7 g/dL (ref 6.0–8.3)

## 2019-05-03 LAB — LIPID PANEL
Cholesterol: 193 mg/dL (ref 0–200)
HDL: 70.4 mg/dL (ref >39.00)
LDL Cholesterol: 105 mg/dL — ABNORMAL HIGH (ref 0–99)
NonHDL: 122.96
Total CHOL/HDL Ratio: 3
Triglycerides: 88 mg/dL (ref 0.0–149.0)
VLDL: 17.6 mg/dL (ref 0.0–40.0)

## 2019-05-03 LAB — VITAMIN D 25 HYDROXY (VIT D DEFICIENCY, FRACTURES): VITD: 27.08 ng/mL — ABNORMAL LOW (ref 30.00–100.00)

## 2019-05-03 LAB — TSH: TSH: 2.1 u[IU]/mL (ref 0.35–4.50)

## 2019-05-05 ENCOUNTER — Other Ambulatory Visit: Payer: Self-pay

## 2019-05-06 ENCOUNTER — Ambulatory Visit (INDEPENDENT_AMBULATORY_CARE_PROVIDER_SITE_OTHER): Payer: PPO | Admitting: Internal Medicine

## 2019-05-06 ENCOUNTER — Encounter: Payer: Self-pay | Admitting: Internal Medicine

## 2019-05-06 ENCOUNTER — Ambulatory Visit: Payer: PPO

## 2019-05-06 ENCOUNTER — Other Ambulatory Visit: Payer: Self-pay

## 2019-05-06 VITALS — BP 118/74 | HR 76 | Temp 96.9°F | Resp 15 | Ht 63.0 in | Wt 182.2 lb

## 2019-05-06 DIAGNOSIS — G8929 Other chronic pain: Secondary | ICD-10-CM

## 2019-05-06 DIAGNOSIS — M25552 Pain in left hip: Secondary | ICD-10-CM | POA: Diagnosis not present

## 2019-05-06 DIAGNOSIS — Z1231 Encounter for screening mammogram for malignant neoplasm of breast: Secondary | ICD-10-CM

## 2019-05-06 DIAGNOSIS — E663 Overweight: Secondary | ICD-10-CM | POA: Diagnosis not present

## 2019-05-06 DIAGNOSIS — M25512 Pain in left shoulder: Secondary | ICD-10-CM

## 2019-05-06 DIAGNOSIS — M25511 Pain in right shoulder: Secondary | ICD-10-CM

## 2019-05-06 DIAGNOSIS — M79652 Pain in left thigh: Secondary | ICD-10-CM | POA: Diagnosis not present

## 2019-05-06 DIAGNOSIS — Z0000 Encounter for general adult medical examination without abnormal findings: Secondary | ICD-10-CM

## 2019-05-06 MED ORDER — ZOSTER VAC RECOMB ADJUVANTED 50 MCG/0.5ML IM SUSR: 0.5000 mL | Freq: Once | INTRAMUSCULAR | 1 refills | Status: AC

## 2019-05-06 NOTE — Progress Notes (Signed)
Patient ID: Helen Mccoy, female    DOB: [birth year]  Age: 63 y.o. MRN: [id number]  The patient is here for annual  wellness examination and management of other chronic and acute problems.  She is accompanied by a Theatre manager and her mother .  This visit occurred during the SARS-CoV-2 public health emergency.  Safety protocols were in place, including screening questions prior to the visit, additional usage of staff PPE, and extensive cleaning of exam room while observing appropriate contact time as indicated for disinfecting solutions.      The risk factors are reflected in the social history.  The roster of all physicians providing medical care to patient - is listed in the Snapshot section of the chart.  Activities of daily living:  The patient is  Legally blind and deaf and requires assistance  To achieve  ADLs: dressing, toileting, feeding as well as independent mobility  Home safety : The patient has smoke detectors in the home. They wear seatbelts.  There are no firearms at home. There is no violence in the home.   There is no risks for hepatitis, STDs or HIV. There is no   history of blood transfusion. They have no travel history to infectious disease endemic areas of the world.  The patient has seen their dentist in the last six month. They have seen their eye doctor in the last year. They admit to slight hearing difficulty with regard to whispered voices and some television programs.  They have deferred audiologic testing in the last year.  They do not  have excessive sun exposure. Discussed the need for sun protection: hats, long sleeves and use of sunscreen if there is significant sun exposure.   Diet: the importance of a healthy diet is discussed. They do have a healthy diet.  The benefits of regular aerobic exercise were discussed. She does not exercise regularly and has gained 2 lbs over the last 4 years. BMI reviwed.   Depression screen: there are no signs or  vegative symptoms of depression- irritability, change in appetite, anhedonia, sadness/tearfullness.  Cognitive assessment: the patient manages all their financial and personal affairs and is actively engaged. They could relate day,date,year and events; recalled 2/3 objects at 3 minutes; performed clock-face test normally.  The following portions of the patient's history were reviewed and updated as appropriate: allergies, current medications, past family history, past medical history,  past surgical history, past social history  and problem list.  Visual acuity was not assessed per patient preference since she has regular follow up with her ophthalmologist. Hearing and body mass index were assessed and reviewed.   During the course of the visit the patient was educated and counseled about appropriate screening and preventive services including : fall prevention , diabetes screening, nutrition counseling, colorectal cancer screening, and recommended immunizations.    CC: The primary encounter diagnosis was Thigh pain, musculoskeletal, left. Diagnoses of Encounter for screening mammogram for breast cancer, Chronic right shoulder pain, Acute pain of left shoulder, Overweight (BMI 25.0-29.9), Visit for preventive health examination, and Chronic left hip pain were also pertinent to this visit.   bilateral shoulder pain .  Limited ROM in right shoulder for the past 2 months  Left shoulder injured ten days ago during a fall down a short flight of stairs.  .  bruising noted on trap muscle  and top of shoulder   DICUSSED REFERRAL TO [facility] in left lateral hip  Since February,  Occurs with  prolonged standing (periods greater than 5 minutes )    Improves with walking around.  Denies low back pain  Except  when vacuuming    History Helen Mccoy has a past medical history of Blind and Deafness (9 /24/1957).   She has a past surgical history that includes Tonsilectomy, adenoidectomy,  bilateral myringotomy and tubes (Bilateral, 1968); Abdominal hysterectomy (1986); Cesarean section; Cosmetic surgery; and Tubal ligation.   Her family history includes Arthritis in her mother; Breast cancer (age of onset: 30) in her mother; Cancer in her mother; Hypertension in her father; Macular degeneration in her brother; Stroke in her father.She reports that she quit smoking about 20 years ago. Her smoking use included cigarettes. She smoked 1.00 pack per day. She has never used smokeless tobacco. She reports current alcohol use. She reports that she does not use drugs.  Outpatient Medications Prior to Visit  Medication Sig Dispense Refill  . Calcium Carbonate-Vitamin D (CALCIUM 500 + D) 500-125 MG-UNIT TABS Take 1 tablet by mouth daily.    . hydrocortisone 2.5 % ointment     . triamcinolone ointment (KENALOG) 0.1 % APP EXT AA BID PRN    . fexofenadine (ALLEGRA) 180 MG tablet Take 1 tablet (180 mg total) by mouth 2 (two) times daily at 8 am and 10 pm. (Patient not taking: Reported on 05/06/2019) 180 tablet 1  . triamcinolone cream (KENALOG) 0.1 % Apply 1 application topically 2 (two) times daily. (Patient not taking: Reported on 05/06/2019) 30 g 0   No facility-administered medications prior to visit.    Review of Systems  Objective:  BP 118/74 (BP Location: Left Arm, Patient Position: Sitting, Cuff Size: Normal)   Pulse 76   Temp (!) 96.9 F (36.1 C) (Temporal)   Resp 15   Ht 5\' 3"  (1.6 m)   Wt 182 lb 3.2 oz (82.6 kg)   SpO2 98%   BMI 32.28 kg/m   Physical Exam    Assessment & Plan:   Problem List Items Addressed This Visit      Unprioritized   Chronic left hip pain    Aggravated by walking,  With numbness in the lateral thigh reported. Plain films of hip  ordered       Overweight (BMI 25.0-29.9)     have addressed  BMI and recommended wt loss of 10% of body weight over the next 6 months using a low glycemic index diet and regular participation in aerobic exercise a  minimum of 30 minutes, a  minimum of 5 days per week.       Visit for preventive health examination    age appropriate education and counseling updated, referrals for preventative services and immunizations addressed, dietary and smoking counseling addressed, most recent labs reviewed.  I have personally reviewed and have noted:  1) the patient's medical and social history 2) The pt's use of alcohol, tobacco, and illicit drugs 3) The patient's current medications and supplements 4) Functional ability including ADL's, fall risk, home safety risk, hearing and visual impairment 5) Diet and physical activities 6) Evidence for depression or mood disorder 7) The patient's height, weight, and BMI have been recorded in the chart  I have made referrals, and provided counseling and education based on review of the above       Other Visit Diagnoses    Thigh pain, musculoskeletal, left    -  Primary   Relevant Orders   DG Hip Unilat W OR W/O Pelvis 2-3 Views Left   Encounter  for screening mammogram for breast cancer       Relevant Orders   3d mammogram [facility]   Chronic right shoulder pain       Relevant Orders   Ambulatory referral to Sports Medicine   Acute pain of left shoulder       Relevant Orders   Ambulatory referral to Sports Medicine      I have discontinued Vinnie Level C. Tegtmeyer's triamcinolone cream. I am also having her start on Zoster Vaccine Adjuvanted. Additionally, I am having her maintain her Calcium Carbonate-Vitamin D, fexofenadine, hydrocortisone, and triamcinolone ointment.  Meds ordered this encounter  Medications  . Zoster Vaccine Adjuvanted [facility]) injection    Sig: Inject 0.5 mLs into the muscle once for 1 dose.    Dispense:  1 each    Refill:  1    Medications Discontinued During This Encounter  Medication Reason  . triamcinolone cream (KENALOG) 0.1 % Change in therapy    Follow-up: No follow-ups on file.   Crecencio Mc, MD

## 2019-05-06 NOTE — Patient Instructions (Addendum)
YOUR INJURED YOUR SHOULDER DURING  YOUR FALL.  To prevent a "frozen shoulder "    YOU SHOULD NOT TAKE MORE THAN ONE ASPIRIN DAILY BECAUSE IT CAN GIVE YOU AN ULCER   I recommend taking Aleve 1 tablet   every  12 hours  AND    You can add up to 2000 mg of acetominophen (tylenol) every day safely  In divided doses (500 mg every 6 hours  Or 1000 mg every 12 hours.)    2) x rays of left hip to evaluate the joint .  You do not need an appointment.  Just go to the [facility].    3)  Referral  To  Dr Charlann Boxer to evaluate both shoulder limitations   4) The ear has a contusion and slight swelling to the cartilage.  This will resolve eventually.   Use ice or cold compresses for 15 min every  6 hours as tolerated    5) You have gained 24 lbs since 2016!  You need to lose wight. Reduce your carbs  And increase your protein.  Exercise daily  6) You need 2000 Ius of Vitamin D3 daily  (separate  Supplement,  Different From your calcium supplement)   7) I do recommend the vaccine for COVID 19  When it becomes available    8) I also recommend the new shingles vaccine .  [facility] to see if you received the Shingrx vaccine    The ShingRx vaccine is now available in local pharmacies and is much more protective than the old one  Zostavax  (it is about 97%  Effective in preventing shingles). .   It is therefore ADVISED for all interested adults over 50 to prevent shingles so I have printed you a prescription for it.  (it requires a 2nd dose 2 to 6 months after the first one) .  It will cause you to have flu  like symptoms for 2 days If your pharmacy is not available to give it to you,  The [facility] will give it to you.  They are located on the ground floor of the [employer]

## 2019-05-09 DIAGNOSIS — M25552 Pain in left hip: Secondary | ICD-10-CM | POA: Insufficient documentation

## 2019-05-09 DIAGNOSIS — M5416 Radiculopathy, lumbar region: Secondary | ICD-10-CM | POA: Insufficient documentation

## 2019-05-09 DIAGNOSIS — G8929 Other chronic pain: Secondary | ICD-10-CM | POA: Insufficient documentation

## 2019-05-09 NOTE — Assessment & Plan Note (Signed)
have addressed  BMI and recommended wt loss of 10% of body weight over the next 6 months using a low glycemic index diet and regular participation in aerobic exercise a minimum of 30 minutes, a  minimum of 5 days per week.

## 2019-05-09 NOTE — Assessment & Plan Note (Signed)
Aggravated by walking,  With numbness in the lateral thigh reported. Plain films of hip  ordered

## 2019-05-09 NOTE — Assessment & Plan Note (Signed)

## 2019-06-11 ENCOUNTER — Ambulatory Visit: Payer: PPO | Admitting: Family Medicine

## 2019-08-04 ENCOUNTER — Ambulatory Visit
Admission: RE | Admit: 2019-08-04 | Discharge: 2019-08-04 | Disposition: A | Discharge status: Home / Self Care | Payer: PPO | Source: Ambulatory Visit | Attending: Internal Medicine | Admitting: Internal Medicine

## 2019-08-04 DIAGNOSIS — Z1231 Encounter for screening mammogram for malignant neoplasm of breast: Secondary | ICD-10-CM | POA: Diagnosis not present

## 2019-08-16 DIAGNOSIS — L239 Allergic contact dermatitis, unspecified cause: Secondary | ICD-10-CM | POA: Diagnosis not present

## 2019-09-02 ENCOUNTER — Telehealth: Payer: Self-pay | Admitting: Internal Medicine

## 2019-09-02 NOTE — Telephone Encounter (Signed)
Pt mother will call back to get pt scheduled for AWV. Pt is deaf and does not live with mother.  Pt due to schedule Medicare Annual Wellness Visit (AWV) either virtually or audio only.  Last AWV 01/09/18; please schedule at anytime with Denisa O'Brien-Blaney at [facility].

## 2019-11-16 DIAGNOSIS — L239 Allergic contact dermatitis, unspecified cause: Secondary | ICD-10-CM | POA: Diagnosis not present

## 2019-12-14 ENCOUNTER — Telehealth: Payer: Self-pay | Admitting: Internal Medicine

## 2019-12-14 DIAGNOSIS — Z0000 Encounter for general adult medical examination without abnormal findings: Secondary | ICD-10-CM

## 2019-12-14 DIAGNOSIS — E782 Mixed hyperlipidemia: Secondary | ICD-10-CM

## 2019-12-14 NOTE — Telephone Encounter (Signed)
Patient has an appointment for cpe on 05/11/2019. Patient would like to have fasting labs done before coming into office. Appt has been made for labs, just need lab orders put in. If patient does not need labs please let me know. Thanks.

## 2019-12-28 ENCOUNTER — Other Ambulatory Visit: Payer: Self-pay | Admitting: Internal Medicine

## 2019-12-28 DIAGNOSIS — M25511 Pain in right shoulder: Secondary | ICD-10-CM

## 2020-01-04 NOTE — Telephone Encounter (Signed)
Pt is scheduled for both labs and cpe in January. I have ordered A1c, CBC, CMP, Lipid panel, and TSH. Is there anything else that needs to be ordered?

## 2020-01-26 DIAGNOSIS — M7541 Impingement syndrome of right shoulder: Secondary | ICD-10-CM | POA: Diagnosis not present

## 2020-01-26 DIAGNOSIS — M65311 Trigger thumb, right thumb: Secondary | ICD-10-CM | POA: Diagnosis not present

## 2020-01-26 DIAGNOSIS — H3552 Pigmentary retinal dystrophy: Secondary | ICD-10-CM | POA: Diagnosis not present

## 2020-01-26 DIAGNOSIS — M25511 Pain in right shoulder: Secondary | ICD-10-CM | POA: Diagnosis not present

## 2020-01-26 DIAGNOSIS — M7581 Other shoulder lesions, right shoulder: Secondary | ICD-10-CM | POA: Diagnosis not present

## 2020-01-26 DIAGNOSIS — M25541 Pain in joints of right hand: Secondary | ICD-10-CM | POA: Diagnosis not present

## 2020-01-26 DIAGNOSIS — H903 Sensorineural hearing loss, bilateral: Secondary | ICD-10-CM | POA: Diagnosis not present

## 2020-01-31 DIAGNOSIS — H3552 Pigmentary retinal dystrophy: Secondary | ICD-10-CM | POA: Diagnosis not present

## 2020-02-29 ENCOUNTER — Ambulatory Visit: Payer: PPO | Attending: Student

## 2020-02-29 ENCOUNTER — Other Ambulatory Visit: Payer: Self-pay

## 2020-02-29 DIAGNOSIS — G8929 Other chronic pain: Secondary | ICD-10-CM | POA: Insufficient documentation

## 2020-02-29 DIAGNOSIS — R252 Cramp and spasm: Secondary | ICD-10-CM | POA: Insufficient documentation

## 2020-02-29 DIAGNOSIS — M25511 Pain in right shoulder: Secondary | ICD-10-CM | POA: Diagnosis not present

## 2020-02-29 NOTE — Therapy (Signed)
[facility] [address] [place], [address] Phone: [phone]   Fax:  [phone]  Physical Therapy Treatment  Patient Details  Name: Helen Mccoy MRN: [id number] Date of Birth: 05/09/1955 Referring Provider (PT): Damaris Hippo, PA-C   Encounter Date: 02/29/2020   PT End of Session - 02/29/20 1527    Visit Number 1    Number of Visits 13    Date for PT Re-Evaluation 04/11/20    PT Start Time 1430    PT Stop Time 1530    PT Time Calculation (min) 60 min    Activity Tolerance Patient tolerated treatment well    Behavior During Therapy [facility] for tasks assessed/performed           Past Medical History:  Diagnosis Date  . Blind    retinitis pigmentosia started at 64 years of age  . Deafness 9 /24/1956   bilateral    Past Surgical History:  Procedure Laterality Date  . ABDOMINAL HYSTERECTOMY  1986   nonmalignant reasons  . CESAREAN SECTION    . COSMETIC SURGERY    . TONSILECTOMY, ADENOIDECTOMY, BILATERAL MYRINGOTOMY AND TUBES Bilateral 1968  . TUBAL LIGATION      There were no vitals filed for this visit.   Subjective Assessment - 02/29/20 1538    Subjective Patient is a 64 y/o female who is blind and deaf with a diagnosis of RC tendonitis with c/c of R anterior shoulder pain that limits her ability to perform multiple ADL's along with reaching [place], reaching back, and behind her back.  She reports that her worst pain arises when she reaches back or behind her back and is not able to fasten her bra or wash her hair due to pain in her R shoulder. She states that she received injections last month that has helped decrease her worst shoulder pain from an 7-8/10 to 4/10. Patients goal for therapy is to decrease pain and be able to reach backwards and behind her back.    Patient is accompained by: Interpreter   Tactile interpreter: Legrand Como   Pertinent History Blind and Deaf    Limitations Lifting;House  hold activities    How long can you sit comfortably? unlimited    How long can you stand comfortably? unlimited    How long can you walk comfortably? unlimited    Patient Stated Goals To decrease pain and be able to reach back and behind her back without pain.    Currently in Pain? Yes    Pain Score 4     Pain Location Shoulder              [facility] PT Assessment - 02/29/20 0001      Assessment   Medical Diagnosis RC Tendinopathy     Referring Provider (PT) Damaris Hippo, PA-C    Hand Dominance Right    Prior Therapy unknown       ROM / Strength   AROM / PROM / Strength AROM;Strength      AROM   Overall AROM  Deficits;Due to pain    Overall AROM Comments --   Limited Extension and Abduction due to pain    AROM Assessment Site Shoulder;Elbow    Right/Left Shoulder Right;Left    Right/Left Elbow Right;Left      Strength   Strength Assessment Site Shoulder;Elbow    Right/Left Shoulder Right;Left    Right Shoulder Flexion 4/5   painful   Right Shoulder ABduction  4/5   painful   Right Shoulder Internal Rotation 4-/5    Right Shoulder External Rotation 4-/5    Left Shoulder Flexion 5/5    Left Shoulder ABduction 5/5    Left Shoulder Internal Rotation 5/5    Left Shoulder External Rotation 5/5    Right/Left Elbow Right;Left    Right Elbow Flexion 4/5   painful   Right Elbow Extension 4+/5    Left Elbow Flexion 4+/5    Left Elbow Extension 4+/5      Special Tests    Special Tests Rotator Cuff Impingement;Laxity/Instability Tests    Other special tests Apleys Scratch    limited on R UE: top on R shoulder above head, R glute BB   Rotator Cuff Impingment tests Belly Press      Belly Press   Findings Positive    Side Right              Therapeutic exercise  - Towel pull behind back x10 - YTB R Shoulder IR x10                   PT Education - 02/29/20 1526    Education Details Educated on HEP and POC    Person(s) Educated Patient    Methods  Explanation;Tactile cues   tactile interpreter   Comprehension Tactile cues required;Returned demonstration   tactile interpreter           PT Short Term Goals - 02/29/20 1528      PT SHORT TERM GOAL #1   Title Patient will demonstrate independence with HEP to maximize rehab potential.    Time 3    Period Weeks    Status New    Target Date 03/21/20             PT Long Term Goals - 02/29/20 1529      PT LONG TERM GOAL #1   Title Patient will demonstrate independence with progressive HEP to maintain progress made during POC.    Time 6    Period Weeks    Status New    Target Date 04/11/20      PT LONG TERM GOAL #2   Title Patient will be able to perform ADL's without an increase in pain to show improvement in shoulder strength and stability    Baseline Increase in pain when performing    Time 6    Period Weeks    Status New    Target Date 04/11/20      PT LONG TERM GOAL #3   Title Patient will be able to reach backward and behind back to show improvement in R shoulder ROM to have ability to fasten bra behind back.    Baseline Cannot fast bra behind back must be done in front    Time 6    Period Weeks    Status New    Target Date 04/11/20                 Plan - 02/29/20 1533    Clinical Impression Statement Patient is a 64 y/o female who is blind and deaf with a diagnosis of RC tendonitis with c/c of R anterior shoulder pain that limits her ability to perform multiple ADL's along with reaching [place], reaching back, and behind her back. Upon evaluation, patient presents with tenderness to palpation in anterior shoulder, subscapularis, bicipital groove, and ER of R shoulder.  Patient's R shoulder strength was limited due to pain during ER,  abduction, and IR.  Patient had a positive belly press test and limited Apley Scratch test on R UE with only being able to get to top on R shoulder and to R glute. Presentation of symptoms and tests performed indicate involvement of  subscapularis similar to a tendinopathy of the subscapularis. Patient will benefit from skilled therapy to address limitations and return to prior level of function.    Personal Factors and Comorbidities Age;Time since onset of injury/illness/exacerbation;Other   Blind and deaf   Examination-Activity Limitations Bathing;Lift;Reach Overhead;Carry    Examination-Participation Restrictions Laundry    Stability/Clinical Decision Making Evolving/Moderate complexity    Clinical Decision Making Moderate    Rehab Potential Good    PT Frequency 2x / week    PT Duration 6 weeks    PT Treatment/Interventions ADLs/Self Care Home Management;Electrical Stimulation;Iontophoresis 4mg /ml Dexamethasone;Biofeedback;DME Instruction;Gait training;Stair training;Functional mobility training;Therapeutic activities;Therapeutic exercise;Balance training;Neuromuscular re-education;Patient/family education;Manual techniques;Passive range of motion;Dry needling;Energy conservation;Joint Manipulations    PT Next Visit Plan Review HEP and began strengthening of R RC    PT Home Exercise Plan Banded IR and Towel Pull Behind Back    Consulted and Agree with Plan of Care Patient           Patient will benefit from skilled therapeutic intervention in order to improve the following deficits and impairments:  Decreased endurance, Decreased mobility, Increased muscle spasms, Impaired vision/preception, Decreased range of motion, Improper body mechanics, Pain, Impaired UE functional use, Decreased strength, Decreased activity tolerance  Visit Diagnosis: Chronic right shoulder pain  Cramp and spasm     Problem List Patient Active Problem List   Diagnosis Date Noted  . Chronic left hip pain 05/09/2019  . Venous stasis dermatitis of both lower extremities 11/04/2018  . Pruritic erythematous rash 10/24/2018  . Hyperlipidemia 05/03/2018  . Bilateral hand numbness 05/01/2016  . Visit for preventive health examination  04/30/2015  . 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 Overweight (BMI 25.0-29.9) 02/01/2013  . Constipation, slow transit 02/01/2013   4:53 PM, 02/29/20 Margarito Liner, SPT Student Physical Therapist [place]  [phone]  Margarito Liner 02/29/2020, 4:52 PM  [place] [facility] [address] [place], [address] Phone: [phone]   Fax:  [phone]  Name: Helen Mccoy MRN: [id number] Date of Birth: 12-12-1955

## 2020-03-07 ENCOUNTER — Ambulatory Visit: Payer: PPO | Attending: Student

## 2020-03-07 ENCOUNTER — Other Ambulatory Visit: Payer: Self-pay

## 2020-03-07 DIAGNOSIS — G8929 Other chronic pain: Secondary | ICD-10-CM | POA: Insufficient documentation

## 2020-03-07 DIAGNOSIS — R252 Cramp and spasm: Secondary | ICD-10-CM | POA: Diagnosis not present

## 2020-03-07 DIAGNOSIS — M25511 Pain in right shoulder: Secondary | ICD-10-CM | POA: Insufficient documentation

## 2020-03-07 NOTE — Therapy (Deleted)
[place] [facility] [address], [place], [address] Phone: [phone]   Fax:  [phone]  Physical Therapy Treatment  Patient Details  Name: Helen Mccoy MRN: [id number] Date of Birth: Mar 19, 1956 Referring Provider (PT): Damaris Hippo, PA-C   Encounter Date: 03/07/2020   PT End of Session - 03/07/20 0952    Visit Number 2    Number of Visits 13    Date for PT Re-Evaluation 04/11/20    PT Start Time 0950    PT Stop Time 1030    PT Time Calculation (min) 40 min    Activity Tolerance Patient tolerated treatment well    Behavior During Therapy [facility] for tasks assessed/performed           Past Medical History:  Diagnosis Date  . Blind    retinitis pigmentosia started at 64 years of age  . Deafness 9 /24/1956   bilateral    Past Surgical History:  Procedure Laterality Date  . ABDOMINAL HYSTERECTOMY  1986   nonmalignant reasons  . CESAREAN SECTION    . COSMETIC SURGERY    . TONSILECTOMY, ADENOIDECTOMY, BILATERAL MYRINGOTOMY AND TUBES Bilateral 1968  . TUBAL LIGATION      There were no vitals filed for this visit.   Subjective Assessment - 03/07/20 0946    Subjective Patient reports her shoulder is ok and but the towel exercise was painful.    Patient is accompained by: Interpreter   Tactile interpreter: Amy   Pertinent History Blind and Deaf    Limitations Lifting;House hold activities    How long can you sit comfortably? unlimited    How long can you stand comfortably? unlimited    How long can you walk comfortably? unlimited    Patient Stated Goals To decrease pain and be able to reach back and behind her back without pain.    Currently in Pain? No/denies              PT Education - 03/07/20 0951    Education Details Educated on form/technique with exercises    Person(s) Educated Patient    Methods Explanation;Tactile cues    Comprehension Tactile cues required;Returned demonstration              PT Short Term Goals - 02/29/20 1528      PT SHORT TERM GOAL #1   Title Patient will demonstrate independence with HEP to maximize rehab potential.    Time 3    Period Weeks    Status New    Target Date 03/21/20             PT Long Term Goals - 02/29/20 1529      PT LONG TERM GOAL #1   Title Patient will demonstrate independence with progressive HEP to maintain progress made during POC.    Time 6    Period Weeks    Status New    Target Date 04/11/20      PT LONG TERM GOAL #2   Title Patient will be able to perform ADL's without an increase in pain to show improvement in shoulder strength and stability    Baseline Increase in pain when performing    Time 6    Period Weeks    Status New    Target Date 04/11/20      PT LONG TERM GOAL #3   Title Patient will be able to reach backward and behind back to show improvement in R  shoulder ROM to have ability to fasten bra behind back.    Baseline Cannot fast bra behind back must be done in front    Time 6    Period Weeks    Status New    Target Date 04/11/20                  Patient will benefit from skilled therapeutic intervention in order to improve the following deficits and impairments:     Visit Diagnosis: Chronic right shoulder pain  Cramp and spasm     Problem List Patient Active Problem List   Diagnosis Date Noted  . Chronic left hip pain 05/09/2019  . Venous stasis dermatitis of both lower extremities 11/04/2018  . Pruritic erythematous rash 10/24/2018  . Hyperlipidemia 05/03/2018  . Bilateral hand numbness 05/01/2016  . Visit for preventive health examination 04/30/2015  . 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 Overweight (BMI 25.0-29.9) 02/01/2013  . Constipation, slow transit 02/01/2013    Blythe Stanford, PT DPT 03/07/2020, 10:22 AM  [place] [facility] [address], [place], [address] Phone: [phone]   Fax:  [phone]  Name: STALEY BUDZINSKI MRN: [id number] Date of Birth: 09/25/1955

## 2020-03-07 NOTE — Therapy (Signed)
[place] [facility] [address] [place], [address] Phone: [phone]   Fax:  [phone]  Physical Therapy Treatment  Patient Details  Name: Helen Mccoy MRN: [id number] Date of Birth: [birth year] Referring Provider (PT): Damaris Hippo, PA-C   Encounter Date: 03/07/2020   PT End of Session - 03/07/20 0952    Visit Number 2    Number of Visits 13    Date for PT Re-Evaluation 04/11/20    PT Start Time 0950    PT Stop Time 1030    PT Time Calculation (min) 40 min    Activity Tolerance Patient tolerated treatment well    Behavior During Therapy [facility] for tasks assessed/performed           Past Medical History:  Diagnosis Date  . Blind    retinitis pigmentosia started at 64 years of age  . Deafness 9 /24/1956   bilateral    Past Surgical History:  Procedure Laterality Date  . ABDOMINAL HYSTERECTOMY  1986   nonmalignant reasons  . CESAREAN SECTION    . COSMETIC SURGERY    . TONSILECTOMY, ADENOIDECTOMY, BILATERAL MYRINGOTOMY AND TUBES Bilateral 1968  . TUBAL LIGATION      There were no vitals filed for this visit.   Subjective Assessment - 03/07/20 0946    Subjective Patient reports her shoulder is ok and but the towel exercise was painful.    Patient is accompained by: Interpreter   Tactile interpreter: Amy   Pertinent History Blind and Deaf    Limitations Lifting;House hold activities    How long can you sit comfortably? unlimited    How long can you stand comfortably? unlimited    How long can you walk comfortably? unlimited    Patient Stated Goals To decrease pain and be able to reach back and behind her back without pain.    Currently in Pain? No/denies           Therapeutic Exercise - RTB IR R Shoulder 2x10 - YTB ER R Shoulder 2x10  - Towel Pull Behind Back x15 - Shoulder Press x10 B 5# - Shoulder Abduction YTB 2x10  - Bicep Curl x10 5# - Horizontal Adduction with RTB  x15  Performed exercises to improve strength and decrease pain      PT Education - 03/07/20 0951    Education Details Educated on form/technique with exercises    Person(s) Educated Patient    Methods Explanation;Tactile cues    Comprehension Tactile cues required;Returned demonstration            PT Short Term Goals - 02/29/20 1528      PT SHORT TERM GOAL #1   Title Patient will demonstrate independence with HEP to maximize rehab potential.    Time 3    Period Weeks    Status New    Target Date 03/21/20             PT Long Term Goals - 02/29/20 1529      PT LONG TERM GOAL #1   Title Patient will demonstrate independence with progressive HEP to maintain progress made during POC.    Time 6    Period Weeks    Status New    Target Date 04/11/20      PT LONG TERM GOAL #2   Title Patient will be able to perform ADL's without an increase in pain to show improvement in shoulder strength and stability    Baseline  Increase in pain when performing    Time 6    Period Weeks    Status New    Target Date 04/11/20      PT LONG TERM GOAL #3   Title Patient will be able to reach backward and behind back to show improvement in R shoulder ROM to have ability to fasten bra behind back.    Baseline Cannot fast bra behind back must be done in front    Time 6    Period Weeks    Status New    Target Date 04/11/20                 Plan - 03/07/20 1035    Clinical Impression Statement Reviewed HEP and patient demonstrated good compliance.  Began strengthening patients R shoulder musculature with focus on RC along with increasing IR ROM.  Patient tolerated exercises well and required tactile cueing to perform with correct form. Patient reaching back and behind her back remain limited and painful.  Patient will continue to benefit from skilled therapy to return to prior level of function.    Personal Factors and Comorbidities Age;Time since onset of  injury/illness/exacerbation;Other   Blind and deaf   Examination-Activity Limitations Bathing;Lift;Reach Overhead;Carry    Examination-Participation Restrictions Laundry    Stability/Clinical Decision Making Evolving/Moderate complexity    Clinical Decision Making Moderate    Rehab Potential Good    PT Frequency 2x / week    PT Duration 6 weeks    PT Treatment/Interventions ADLs/Self Care Home Management;Electrical Stimulation;Iontophoresis 4mg /ml Dexamethasone;Biofeedback;DME Instruction;Gait training;Stair training;Functional mobility training;Therapeutic activities;Therapeutic exercise;Balance training;Neuromuscular re-education;Patient/family education;Manual techniques;Passive range of motion;Dry needling;Energy conservation;Joint Manipulations    PT Next Visit Plan IR ROM and RC strengthening    PT Home Exercise Plan Banded IR and Towel Pull Behind Back    Consulted and Agree with Plan of Care Patient           Patient will benefit from skilled therapeutic intervention in order to improve the following deficits and impairments:  Decreased endurance, Decreased mobility, Increased muscle spasms, Impaired vision/preception, Decreased range of motion, Improper body mechanics, Pain, Impaired UE functional use, Decreased strength, Decreased activity tolerance  Visit Diagnosis: Chronic right shoulder pain  Cramp and spasm     Problem List Patient Active Problem List   Diagnosis Date Noted  . Chronic left hip pain 05/09/2019  . Venous stasis dermatitis of both lower extremities 11/04/2018  . Pruritic erythematous rash 10/24/2018  . Hyperlipidemia 05/03/2018  . Bilateral hand numbness 05/01/2016  . Visit for preventive health examination 04/30/2015  . 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 Overweight (BMI 25.0-29.9) 02/01/2013  . Constipation, slow transit 02/01/2013   10:36 AM, 03/07/20 Margarito Liner, SPT Student Physical  Therapist [place]  [phone]  Margarito Liner 03/07/2020, 10:36 AM  [facility] [address] [place], [address] Phone: [phone]   Fax:  [phone]  Name: Helen Mccoy MRN: [id number] Date of Birth: [birth year]

## 2020-03-08 DIAGNOSIS — M7581 Other shoulder lesions, right shoulder: Secondary | ICD-10-CM | POA: Diagnosis not present

## 2020-03-08 DIAGNOSIS — M7541 Impingement syndrome of right shoulder: Secondary | ICD-10-CM | POA: Diagnosis not present

## 2020-03-08 DIAGNOSIS — M65311 Trigger thumb, right thumb: Secondary | ICD-10-CM | POA: Diagnosis not present

## 2020-03-13 ENCOUNTER — Other Ambulatory Visit: Payer: Self-pay

## 2020-03-13 ENCOUNTER — Ambulatory Visit: Payer: PPO

## 2020-03-13 DIAGNOSIS — R252 Cramp and spasm: Secondary | ICD-10-CM

## 2020-03-13 DIAGNOSIS — M25511 Pain in right shoulder: Secondary | ICD-10-CM | POA: Diagnosis not present

## 2020-03-13 DIAGNOSIS — G8929 Other chronic pain: Secondary | ICD-10-CM

## 2020-03-13 NOTE — Therapy (Signed)
[facility] [address], [place], [address] Phone: [phone]   Fax:  [phone]  Physical Therapy Treatment  Patient Details  Name: Helen Mccoy MRN: [id number] Date of Birth: 09/18/1955 Referring Provider (PT): Damaris Hippo, PA-C   Encounter Date: 03/13/2020   PT End of Session - 03/13/20 1806    Visit Number 3    Number of Visits 13    Date for PT Re-Evaluation 04/11/20    PT Start Time 6222    PT Stop Time 1815    PT Time Calculation (min) 45 min    Activity Tolerance Patient tolerated treatment well    Behavior During Therapy [facility] for tasks assessed/performed           Past Medical History:  Diagnosis Date  . Blind    retinitis pigmentosia started at 64 years of age  . Deafness 9 /24/1956   bilateral    Past Surgical History:  Procedure Laterality Date  . ABDOMINAL HYSTERECTOMY  1986   nonmalignant reasons  . CESAREAN SECTION    . COSMETIC SURGERY    . TONSILECTOMY, ADENOIDECTOMY, BILATERAL MYRINGOTOMY AND TUBES Bilateral 1968  . TUBAL LIGATION      There were no vitals filed for this visit.   Subjective Assessment - 03/13/20 1732    Subjective Patient reports she hasnt been exercising lately due to booster shot because she felt sick.    Patient is accompained by: Interpreter   Tactile interpreter: Amy   Pertinent History Blind and Deaf    Limitations Lifting;House hold activities    How long can you sit comfortably? unlimited    How long can you stand comfortably? unlimited    How long can you walk comfortably? unlimited    Patient Stated Goals To decrease pain and be able to reach back and behind her back without pain.    Currently in Pain? No/denies          Therapeutic Exercise GTB IR R Shoulder 2x12 GTB ER R Shoulder 2x102 Seated Rows with GTB  Shoulder Abduction 3# 2x10  Bicep Curl x10 5# Horizontal Adduction with GTB x15 Towel Pull behind back x10      Performed exercises to improve strength and decrease pain    PT Education - 03/13/20 1736    Education Details Form/Technique with Exercises    Person(s) Educated Patient    Methods Explanation;Demonstration    Comprehension Verbalized understanding;Returned demonstration            PT Short Term Goals - 02/29/20 1528      PT SHORT TERM GOAL #1   Title Patient will demonstrate independence with HEP to maximize rehab potential.    Time 3    Period Weeks    Status New    Target Date 03/21/20             PT Long Term Goals - 02/29/20 1529      PT LONG TERM GOAL #1   Title Patient will demonstrate independence with progressive HEP to maintain progress made during POC.    Time 6    Period Weeks    Status New    Target Date 04/11/20      PT LONG TERM GOAL #2   Title Patient will be able to perform ADL's without an increase in pain to show improvement in shoulder strength and stability    Baseline Increase in pain when performing    Time 6  Period Weeks    Status New    Target Date 04/11/20      PT LONG TERM GOAL #3   Title Patient will be able to reach backward and behind back to show improvement in R shoulder ROM to have ability to fasten bra behind back.    Baseline Cannot fast bra behind back must be done in front    Time 6    Period Weeks    Status New    Target Date 04/11/20                 Plan - 03/13/20 1807    Clinical Impression Statement Continued with strengthening and improving patients IR of R shoulder during session. Patient is making improvements in strength by demonstrating ability to increase resistance with mild fatigue at end of session. Patient shoulder continues to cause mild pain in R arm from shoulder to elbow along with anterior shoulder pain with pulling movements.  Patient will benefit from skilled therapy to return to prior level of function.    Personal Factors and Comorbidities Age;Time since onset of  injury/illness/exacerbation;Other   Blind and deaf   Examination-Activity Limitations Bathing;Lift;Reach Overhead;Carry    Examination-Participation Restrictions Laundry    Stability/Clinical Decision Making Evolving/Moderate complexity    Clinical Decision Making Moderate    Rehab Potential Good    PT Frequency 2x / week    PT Duration 6 weeks    PT Treatment/Interventions ADLs/Self Care Home Management;Electrical Stimulation;Iontophoresis 4mg /ml Dexamethasone;Biofeedback;DME Instruction;Gait training;Stair training;Functional mobility training;Therapeutic activities;Therapeutic exercise;Balance training;Neuromuscular re-education;Patient/family education;Manual techniques;Passive range of motion;Dry needling;Energy conservation;Joint Manipulations    PT Next Visit Plan IR ROM and RC strengthening    PT Home Exercise Plan Banded IR and Towel Pull Behind Back    Consulted and Agree with Plan of Care Patient           Patient will benefit from skilled therapeutic intervention in order to improve the following deficits and impairments:  Decreased endurance, Decreased mobility, Increased muscle spasms, Impaired vision/preception, Decreased range of motion, Improper body mechanics, Pain, Impaired UE functional use, Decreased strength, Decreased activity tolerance  Visit Diagnosis: Chronic right shoulder pain  Cramp and spasm     Problem List Patient Active Problem List   Diagnosis Date Noted  . Chronic left hip pain 05/09/2019  . Venous stasis dermatitis of both lower extremities 11/04/2018  . Pruritic erythematous rash 10/24/2018  . Hyperlipidemia 05/03/2018  . Bilateral hand numbness 05/01/2016  . Visit for preventive health examination 04/30/2015  . 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 Overweight (BMI 25.0-29.9) 02/01/2013  . Constipation, slow transit 02/01/2013   6:15 PM, 03/13/20 Margarito Liner, SPT Student Physical Therapist  [place]  [phone]  Margarito Liner 03/13/2020, 6:08 PM  [place] [facility] [address] [place], [address] Phone: [phone]   Fax:  [phone]  Name: Helen Mccoy MRN: [id number] Date of Birth: Aug 14, 1955

## 2020-03-15 ENCOUNTER — Ambulatory Visit: Payer: PPO

## 2020-03-15 ENCOUNTER — Other Ambulatory Visit: Payer: Self-pay

## 2020-03-15 DIAGNOSIS — M25511 Pain in right shoulder: Secondary | ICD-10-CM | POA: Diagnosis not present

## 2020-03-15 DIAGNOSIS — G8929 Other chronic pain: Secondary | ICD-10-CM

## 2020-03-15 DIAGNOSIS — R252 Cramp and spasm: Secondary | ICD-10-CM

## 2020-03-15 NOTE — Therapy (Signed)
[place] [facility] [address], [place], [address] Phone: [phone]   Fax:  [phone]  Physical Therapy Treatment  Patient Details  Name: Helen Mccoy MRN: [id number] Date of Birth: Jan 15, 1956 Referring Provider (PT): Damaris Hippo, PA-C   Encounter Date: 03/15/2020   PT End of Session - 03/15/20 1732    Visit Number 4    Number of Visits 13    Date for PT Re-Evaluation 04/11/20    PT Start Time 9242    PT Stop Time 1815    PT Time Calculation (min) 45 min    Activity Tolerance Patient tolerated treatment well    Behavior During Therapy [facility] for tasks assessed/performed           Past Medical History:  Diagnosis Date  . Blind    retinitis pigmentosia started at 64 years of age  . Deafness 9 /24/1956   bilateral    Past Surgical History:  Procedure Laterality Date  . ABDOMINAL HYSTERECTOMY  1986   nonmalignant reasons  . CESAREAN SECTION    . COSMETIC SURGERY    . TONSILECTOMY, ADENOIDECTOMY, BILATERAL MYRINGOTOMY AND TUBES Bilateral 1968  . TUBAL LIGATION      There were no vitals filed for this visit.   Subjective Assessment - 03/15/20 1731    Subjective Patient reports her shoulder is feeling good.    Patient is accompained by: Interpreter   Tactile interpreter: Amy   Pertinent History Blind and Deaf    Limitations Lifting;House hold activities    How long can you sit comfortably? unlimited    How long can you stand comfortably? unlimited    How long can you walk comfortably? unlimited    Patient Stated Goals To decrease pain and be able to reach back and behind her back without pain.    Currently in Pain? No/denies          Therapeutic Exercise GTB IR R Shoulder 2x12 GTB ER R Shoulder 2x12 Around the Worlds 2x10 with 10#  D2 R Shoulder Pattern GTB 2x10  Seated Rows OMEGA x15 20# Shoulder Abduction 5# 2x8 Bicep Curl x10 5# Horizontal Adduction with GTB x15   Performed  exercises to improve strength and decrease pain     PT Education - 03/15/20 1731    Education Details Form/Technique with Exercises    Person(s) Educated Patient    Methods Explanation;Demonstration    Comprehension Verbalized understanding;Returned demonstration            PT Short Term Goals - 02/29/20 1528      PT SHORT TERM GOAL #1   Title Patient will demonstrate independence with HEP to maximize rehab potential.    Time 3    Period Weeks    Status New    Target Date 03/21/20             PT Long Term Goals - 02/29/20 1529      PT LONG TERM GOAL #1   Title Patient will demonstrate independence with progressive HEP to maintain progress made during POC.    Time 6    Period Weeks    Status New    Target Date 04/11/20      PT LONG TERM GOAL #2   Title Patient will be able to perform ADL's without an increase in pain to show improvement in shoulder strength and stability    Baseline Increase in pain when performing    Time 6  Period Weeks    Status New    Target Date 04/11/20      PT LONG TERM GOAL #3   Title Patient will be able to reach backward and behind back to show improvement in R shoulder ROM to have ability to fasten bra behind back.    Baseline Cannot fast bra behind back must be done in front    Time 6    Period Weeks    Status New    Target Date 04/11/20                 Plan - 03/15/20 1754    Clinical Impression Statement Continued working on improving patients R shoulder strength and motor control to help reduce pain when reach back and behind back.  Patient's R shoulder ROM of limited movement is improving; however, remains painful. Patient tolerated exercise well with no increase in symptoms. Patient will continue to benefit from skilled therapy to return to prior level of function.    Personal Factors and Comorbidities Age;Time since onset of injury/illness/exacerbation;Other   Blind and deaf   Examination-Activity Limitations  Bathing;Lift;Reach Overhead;Carry    Examination-Participation Restrictions Laundry    Stability/Clinical Decision Making Evolving/Moderate complexity    Clinical Decision Making Moderate    Rehab Potential Good    PT Frequency 2x / week    PT Duration 6 weeks    PT Treatment/Interventions ADLs/Self Care Home Management;Electrical Stimulation;Iontophoresis 4mg /ml Dexamethasone;Biofeedback;DME Instruction;Gait training;Stair training;Functional mobility training;Therapeutic activities;Therapeutic exercise;Balance training;Neuromuscular re-education;Patient/family education;Manual techniques;Passive range of motion;Dry needling;Energy conservation;Joint Manipulations    PT Next Visit Plan IR ROM and RC strengthening    PT Home Exercise Plan Banded IR and Towel Pull Behind Back    Consulted and Agree with Plan of Care Patient           Patient will benefit from skilled therapeutic intervention in order to improve the following deficits and impairments:  Decreased endurance, Decreased mobility, Increased muscle spasms, Impaired vision/preception, Decreased range of motion, Improper body mechanics, Pain, Impaired UE functional use, Decreased strength, Decreased activity tolerance  Visit Diagnosis: Chronic right shoulder pain  Cramp and spasm     Problem List Patient Active Problem List   Diagnosis Date Noted  . Chronic left hip pain 05/09/2019  . Venous stasis dermatitis of both lower extremities 11/04/2018  . Pruritic erythematous rash 10/24/2018  . Hyperlipidemia 05/03/2018  . Bilateral hand numbness 05/01/2016  . Visit for preventive health examination 04/30/2015  . 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 Overweight (BMI 25.0-29.9) 02/01/2013  . Constipation, slow transit 02/01/2013   6:15 PM, 03/15/20 Margarito Liner, SPT Student Physical Therapist [place]  [phone]  Margarito Liner 03/15/2020, 6:15 PM  [place] [facility] [address], [place], [address] Phone: [phone]   Fax:  [phone]  Name: Helen Mccoy MRN: [id number] Date of Birth: Mar 27, 1956

## 2020-03-21 ENCOUNTER — Ambulatory Visit: Payer: PPO

## 2020-03-21 ENCOUNTER — Other Ambulatory Visit: Payer: Self-pay

## 2020-03-21 DIAGNOSIS — M25511 Pain in right shoulder: Secondary | ICD-10-CM | POA: Diagnosis not present

## 2020-03-21 DIAGNOSIS — G8929 Other chronic pain: Secondary | ICD-10-CM

## 2020-03-21 DIAGNOSIS — R252 Cramp and spasm: Secondary | ICD-10-CM

## 2020-03-21 NOTE — Therapy (Signed)
[place] [facility] [address] [place], [address] Phone: [phone]   Fax:  [phone]  Physical Therapy Treatment  Patient Details  Name: Helen Mccoy MRN: [id number] Date of Birth: [birth year] Referring Provider (PT): Damaris Hippo, PA-C   Encounter Date: 03/21/2020   PT End of Session - 03/21/20 1302    Visit Number 5    Number of Visits 13    Date for PT Re-Evaluation 04/11/20    PT Start Time 1300    PT Stop Time 1345    PT Time Calculation (min) 45 min    Activity Tolerance Patient tolerated treatment well    Behavior During Therapy [facility] for tasks assessed/performed           Past Medical History:  Diagnosis Date  . Blind    retinitis pigmentosia started at 64 years of age  . Deafness 9 /24/1956   bilateral    Past Surgical History:  Procedure Laterality Date  . ABDOMINAL HYSTERECTOMY  1986   nonmalignant reasons  . CESAREAN SECTION    . COSMETIC SURGERY    . TONSILECTOMY, ADENOIDECTOMY, BILATERAL MYRINGOTOMY AND TUBES Bilateral 1968  . TUBAL LIGATION      There were no vitals filed for this visit.   Subjective Assessment - 03/21/20 1300    Subjective Patient reports she is not able to get her shoulder as far back.    Patient is accompained by: Interpreter   Tactile interpreter: Amy   Pertinent History Blind and Deaf    Limitations Lifting;House hold activities    How long can you sit comfortably? unlimited    How long can you stand comfortably? unlimited    How long can you walk comfortably? unlimited    Patient Stated Goals To decrease pain and be able to reach back and behind her back without pain.    Currently in Pain? No/denies          Therapeutic Exercise GTB IR R Shoulder 2x12 GTB ER R Shoulder 2x12 Around the Worlds 2x10 with 10#  Abduction with Band Behind Back GTB 2x12  D2 R Shoulder Pattern GTB 2x10  Seated Rows OMEGA 2x15 20# Lat Pulldown 2x10 #20 Horizontal  Adduction with GTB x15   Performed exercises to improve strength and decrease pain     PT Education - 03/21/20 1301    Education Details Form/Technique with Exercise            PT Short Term Goals - 02/29/20 1528      PT SHORT TERM GOAL #1   Title Patient will demonstrate independence with HEP to maximize rehab potential.    Time 3    Period Weeks    Status New    Target Date 03/21/20             PT Long Term Goals - 02/29/20 1529      PT LONG TERM GOAL #1   Title Patient will demonstrate independence with progressive HEP to maintain progress made during POC.    Time 6    Period Weeks    Status New    Target Date 04/11/20      PT LONG TERM GOAL #2   Title Patient will be able to perform ADL's without an increase in pain to show improvement in shoulder strength and stability    Baseline Increase in pain when performing    Time 6    Period Weeks    Status  New    Target Date 04/11/20      PT LONG TERM GOAL #3   Title Patient will be able to reach backward and behind back to show improvement in R shoulder ROM to have ability to fasten bra behind back.    Baseline Cannot fast bra behind back must be done in front    Time 6    Period Weeks    Status New    Target Date 04/11/20                 Plan - 03/21/20 1348    Clinical Impression Statement Continued with strengthening R shoulder to decrease patient's symptoms when reaching back and putting her arm behind her back. Patients ROM increased during session demonstrated by ability to place arm farther behind her back to around T12. Patient had mild R anterior shoulder pain performing horizontal adduction test. Patient will continue to benefit from skilled therapy to return to prior level of function.    Personal Factors and Comorbidities Age;Time since onset of injury/illness/exacerbation;Other   Blind and deaf   Examination-Activity Limitations Bathing;Lift;Reach Overhead;Carry    Examination-Participation  Restrictions Laundry    Stability/Clinical Decision Making Evolving/Moderate complexity    Clinical Decision Making Moderate    Rehab Potential Good    PT Frequency 2x / week    PT Duration 6 weeks    PT Treatment/Interventions ADLs/Self Care Home Management;Electrical Stimulation;Iontophoresis 4mg /ml Dexamethasone;Biofeedback;DME Instruction;Gait training;Stair training;Functional mobility training;Therapeutic activities;Therapeutic exercise;Balance training;Neuromuscular re-education;Patient/family education;Manual techniques;Passive range of motion;Dry needling;Energy conservation;Joint Manipulations    PT Next Visit Plan IR ROM and RC strengthening    PT Home Exercise Plan Banded IR and Towel Pull Behind Back    Consulted and Agree with Plan of Care Patient           Patient will benefit from skilled therapeutic intervention in order to improve the following deficits and impairments:  Decreased endurance, Decreased mobility, Increased muscle spasms, Impaired vision/preception, Decreased range of motion, Improper body mechanics, Pain, Impaired UE functional use, Decreased strength, Decreased activity tolerance  Visit Diagnosis: Chronic right shoulder pain  Cramp and spasm     Problem List Patient Active Problem List   Diagnosis Date Noted  . Chronic left hip pain 05/09/2019  . Venous stasis dermatitis of both lower extremities 11/04/2018  . Pruritic erythematous rash 10/24/2018  . Hyperlipidemia 05/03/2018  . Bilateral hand numbness 05/01/2016  . Visit for preventive health examination 04/30/2015  . 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 Overweight (BMI 25.0-29.9) 02/01/2013  . Constipation, slow transit 02/01/2013   1:49 PM, 03/21/20 Margarito Liner, SPT Student Physical Therapist [place]  [phone]  Margarito Liner 03/21/2020, 1:48 PM  [place] [facility] [address] [place], [address] Phone: [phone]   Fax:  [phone]  Name: Helen Mccoy MRN: [id number] Date of Birth: August 07, 1955

## 2020-03-23 ENCOUNTER — Ambulatory Visit: Payer: PPO

## 2020-03-23 ENCOUNTER — Other Ambulatory Visit: Payer: Self-pay

## 2020-03-23 DIAGNOSIS — R252 Cramp and spasm: Secondary | ICD-10-CM

## 2020-03-23 DIAGNOSIS — M25511 Pain in right shoulder: Secondary | ICD-10-CM | POA: Diagnosis not present

## 2020-03-23 DIAGNOSIS — G8929 Other chronic pain: Secondary | ICD-10-CM

## 2020-03-23 NOTE — Therapy (Signed)
[place] [facility] [address] [place], [address] Phone: [phone]   Fax:  [phone]  Physical Therapy Treatment  Patient Details  Name: Helen Mccoy MRN: [id number] Date of Birth: October 23, 1955 Referring Provider (PT): Damaris Hippo, PA-C   Encounter Date: 03/23/2020   PT End of Session - 03/23/20 1350    Visit Number 6    Number of Visits 13    Date for PT Re-Evaluation 04/11/20    PT Start Time 9702    PT Stop Time 1430    PT Time Calculation (min) 45 min    Activity Tolerance Patient tolerated treatment well    Behavior During Therapy [facility] for tasks assessed/performed           Past Medical History:  Diagnosis Date  . Blind    retinitis pigmentosia started at 64 years of age  . Deafness 9 /24/1956   bilateral    Past Surgical History:  Procedure Laterality Date  . ABDOMINAL HYSTERECTOMY  1986   nonmalignant reasons  . CESAREAN SECTION    . COSMETIC SURGERY    . TONSILECTOMY, ADENOIDECTOMY, BILATERAL MYRINGOTOMY AND TUBES Bilateral 1968  . TUBAL LIGATION      There were no vitals filed for this visit.   Subjective Assessment - 03/23/20 1347    Subjective Patient reports she has pain in shoulder and that she may be over working it.    Patient is accompained by: Interpreter   Tactile interpreter: Amy   Pertinent History Blind and Deaf    Limitations Lifting;House hold activities    How long can you sit comfortably? unlimited    How long can you stand comfortably? unlimited    How long can you walk comfortably? unlimited    Patient Stated Goals To decrease pain and be able to reach back and behind her back without pain.    Currently in Pain? No/denies           Therapeutic Exercise Isometric Pulldown 3x10sec  Isometric ER 3x10sec  Towel Pull IR  Grey Band Lat Pulldown x10  Lat Pulldown 3x10 #35    Performed exercises to improve strength and decrease pain     PT Education -  03/23/20 1350    Education Details Form/Technique with Exercise    Person(s) Educated Patient    Methods Explanation;Demonstration    Comprehension Verbalized understanding;Returned demonstration            PT Short Term Goals - 02/29/20 1528      PT SHORT TERM GOAL #1   Title Patient will demonstrate independence with HEP to maximize rehab potential.    Time 3    Period Weeks    Status New    Target Date 03/21/20             PT Long Term Goals - 02/29/20 1529      PT LONG TERM GOAL #1   Title Patient will demonstrate independence with progressive HEP to maintain progress made during POC.    Time 6    Period Weeks    Status New    Target Date 04/11/20      PT LONG TERM GOAL #2   Title Patient will be able to perform ADL's without an increase in pain to show improvement in shoulder strength and stability    Baseline Increase in pain when performing    Time 6    Period Weeks    Status New  Target Date 04/11/20      PT LONG TERM GOAL #3   Title Patient will be able to reach backward and behind back to show improvement in R shoulder ROM to have ability to fasten bra behind back.    Baseline Cannot fast bra behind back must be done in front    Time 6    Period Weeks    Status New    Target Date 04/11/20                 Plan - 03/23/20 1431    Clinical Impression Statement Reassessed patients symptoms during beginning of session due to increase in pain she has been having.  Patient has increased pain with palpation to R tricep, R pectoral along with pain with inferior glides.  Continued with improving ROM when reaching back and behind back and isometrics of lats helped to improve ROM in-session.  Patient will continue to benefit from skilled therapy to return to prior level of function.    Personal Factors and Comorbidities Age;Time since onset of injury/illness/exacerbation;Other   Blind and deaf   Examination-Activity Limitations Bathing;Lift;Reach  Overhead;Carry    Examination-Participation Restrictions Laundry    Stability/Clinical Decision Making Evolving/Moderate complexity    Clinical Decision Making Moderate    Rehab Potential Good    PT Frequency 2x / week    PT Duration 6 weeks    PT Treatment/Interventions ADLs/Self Care Home Management;Electrical Stimulation;Iontophoresis 4mg /ml Dexamethasone;Biofeedback;DME Instruction;Gait training;Stair training;Functional mobility training;Therapeutic activities;Therapeutic exercise;Balance training;Neuromuscular re-education;Patient/family education;Manual techniques;Passive range of motion;Dry needling;Energy conservation;Joint Manipulations    PT Next Visit Plan IR ROM and RC strengthening    PT Home Exercise Plan Banded IR and Towel Pull Behind Back    Consulted and Agree with Plan of Care Patient           Patient will benefit from skilled therapeutic intervention in order to improve the following deficits and impairments:  Decreased endurance, Decreased mobility, Increased muscle spasms, Impaired vision/preception, Decreased range of motion, Improper body mechanics, Pain, Impaired UE functional use, Decreased strength, Decreased activity tolerance  Visit Diagnosis: Chronic right shoulder pain  Cramp and spasm     Problem List Patient Active Problem List   Diagnosis Date Noted  . Chronic left hip pain 05/09/2019  . Venous stasis dermatitis of both lower extremities 11/04/2018  . Pruritic erythematous rash 10/24/2018  . Hyperlipidemia 05/03/2018  . Bilateral hand numbness 05/01/2016  . Visit for preventive health examination 04/30/2015  . 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 Overweight (BMI 25.0-29.9) 02/01/2013  . Constipation, slow transit 02/01/2013   2:33 PM, 03/23/20 Margarito Liner, SPT Student Physical Therapist [place]  [phone]  Margarito Liner 03/23/2020, 2:31 PM  [place] [facility] [address] [place], [address] Phone: [phone]   Fax:  [phone]  Name: Helen Mccoy MRN: [id number] Date of Birth: 09-Nov-1955

## 2020-03-28 ENCOUNTER — Other Ambulatory Visit: Payer: Self-pay

## 2020-03-28 ENCOUNTER — Ambulatory Visit: Payer: PPO

## 2020-03-28 DIAGNOSIS — R252 Cramp and spasm: Secondary | ICD-10-CM

## 2020-03-28 DIAGNOSIS — G8929 Other chronic pain: Secondary | ICD-10-CM

## 2020-03-28 DIAGNOSIS — M25511 Pain in right shoulder: Secondary | ICD-10-CM | POA: Diagnosis not present

## 2020-03-28 NOTE — Therapy (Signed)
[place] [facility] [address] [place], [address] Phone: [phone]   Fax:  [phone]  Physical Therapy Treatment  Patient Details  Name: Helen Mccoy MRN: [id number] Date of Birth: 10-24-1955 Referring Provider (PT): Damaris Hippo, [place]   Encounter Date: 03/28/2020   PT End of Session - 03/28/20 1310    Visit Number 7    Number of Visits 13    Date for PT Re-Evaluation 04/11/20    PT Start Time 1300    PT Stop Time 1345    PT Time Calculation (min) 45 min    Activity Tolerance Patient tolerated treatment well    Behavior During Therapy [facility] for tasks assessed/performed           Past Medical History:  Diagnosis Date  . Blind    retinitis pigmentosia started at 64 years of age  . Deafness 9 /24/1956   bilateral    Past Surgical History:  Procedure Laterality Date  . ABDOMINAL HYSTERECTOMY  1986   nonmalignant reasons  . CESAREAN SECTION    . COSMETIC SURGERY    . TONSILECTOMY, ADENOIDECTOMY, BILATERAL MYRINGOTOMY AND TUBES Bilateral 1968  . TUBAL LIGATION      There were no vitals filed for this visit.   Subjective Assessment - 03/28/20 1305    Subjective Patient states she has been performing her exercises, state sshe has not been performing all of them seocndary to recieiving the booster shots.    Patient is accompained by: Interpreter   Tactile interpreter: Amy   Pertinent History Blind and Deaf    Limitations Lifting;House hold activities    How long can you sit comfortably? unlimited    How long can you stand comfortably? unlimited    How long can you walk comfortably? unlimited    Patient Stated Goals To decrease pain and be able to reach back and behind her back without pain.    Currently in Pain? No/denies              TREATMENT Therapeutic Exercise Weight passes behind back - 2 x 10 4# Lat pull down isometrics in sitting against manual resistance - 5 sec holds x  5 Towel pulls behind back - x 10 ; x 10 with isometric holds downward Radial nerve glides - 2 x 15 Shoulder abduction on the R UE - x 15 Bicep curls in standing with shoulder into slight extension - 2 x 10  Performed exercises to decrease increased shoulder pain and improve AROM          PT Education - 03/28/20 1307    Education Details form/technique with exercise    Person(s) Educated Patient    Methods Explanation;Demonstration    Comprehension Returned demonstration;Verbalized understanding            PT Short Term Goals - 02/29/20 1528      PT SHORT TERM GOAL #1   Title Patient will demonstrate independence with HEP to maximize rehab potential.    Time 3    Period Weeks    Status New    Target Date 03/21/20             PT Long Term Goals - 02/29/20 1529      PT LONG TERM GOAL #1   Title Patient will demonstrate independence with progressive HEP to maintain progress made during POC.    Time 6    Period Weeks    Status New  Target Date 04/11/20      PT LONG TERM GOAL #2   Title Patient will be able to perform ADL's without an increase in pain to show improvement in shoulder strength and stability    Baseline Increase in pain when performing    Time 6    Period Weeks    Status New    Target Date 04/11/20      PT LONG TERM GOAL #3   Title Patient will be able to reach backward and behind back to show improvement in R shoulder ROM to have ability to fasten bra behind back.    Baseline Cannot fast bra behind back must be done in front    Time 6    Period Weeks    Status New    Target Date 04/11/20                 Plan - 03/28/20 1657    Clinical Impression Statement Improvement in ability to lift arm behind back during today's session, able to go through overall greater AROM, however continues to have pain at end range. Patient with radial nerve symptoms with performance of exercise, concordant pain with nerve glides onto the affected side.  Will continue to address this limitation throughout the session. Patient will benefit from further skilled therapy to return to prior level of function.    Personal Factors and Comorbidities Age;Time since onset of injury/illness/exacerbation;Other   Blind and deaf   Examination-Activity Limitations Bathing;Lift;Reach Overhead;Carry    Examination-Participation Restrictions Laundry    Stability/Clinical Decision Making Evolving/Moderate complexity    Rehab Potential Good    PT Frequency 2x / week    PT Duration 6 weeks    PT Treatment/Interventions ADLs/Self Care Home Management;Electrical Stimulation;Iontophoresis 4mg /ml Dexamethasone;Biofeedback;DME Instruction;Gait training;Stair training;Functional mobility training;Therapeutic activities;Therapeutic exercise;Balance training;Neuromuscular re-education;Patient/family education;Manual techniques;Passive range of motion;Dry needling;Energy conservation;Joint Manipulations    PT Next Visit Plan IR ROM and RC strengthening    PT Home Exercise Plan Banded IR and Towel Pull Behind Back    Consulted and Agree with Plan of Care Patient           Patient will benefit from skilled therapeutic intervention in order to improve the following deficits and impairments:  Decreased endurance, Decreased mobility, Increased muscle spasms, Impaired vision/preception, Decreased range of motion, Improper body mechanics, Pain, Impaired UE functional use, Decreased strength, Decreased activity tolerance  Visit Diagnosis: Chronic right shoulder pain  Cramp and spasm     Problem List Patient Active Problem List   Diagnosis Date Noted  . Chronic left hip pain 05/09/2019  . Venous stasis dermatitis of both lower extremities 11/04/2018  . Pruritic erythematous rash 10/24/2018  . Hyperlipidemia 05/03/2018  . Bilateral hand numbness 05/01/2016  . Visit for preventive health examination 04/30/2015  . 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 Overweight (BMI 25.0-29.9) 02/01/2013  . Constipation, slow transit 02/01/2013    Blythe Stanford, PT DPT 03/28/2020, 5:00 PM  [place] [facility] [address], [place], [address] Phone: [phone]   Fax:  [phone]  Name: BALJIT LIEBERT MRN: [id number] Date of Birth: 1955-10-24

## 2020-04-03 ENCOUNTER — Ambulatory Visit: Payer: PPO

## 2020-04-03 ENCOUNTER — Other Ambulatory Visit: Payer: Self-pay

## 2020-04-03 DIAGNOSIS — M25511 Pain in right shoulder: Secondary | ICD-10-CM | POA: Diagnosis not present

## 2020-04-03 DIAGNOSIS — R252 Cramp and spasm: Secondary | ICD-10-CM

## 2020-04-03 NOTE — Therapy (Signed)
[facility] [address], [place], [address] Phone: [phone]   Fax:  [phone]  Physical Therapy Treatment  Patient Details  Name: Helen Mccoy MRN: [id number] Date of Birth: 15-Sep-1955 Referring Provider (PT): Damaris Hippo, PA-C   Encounter Date: 04/03/2020   PT End of Session - 04/03/20 0915    Visit Number 8    Number of Visits 13    Date for PT Re-Evaluation 04/11/20    PT Start Time 0900    PT Stop Time 0945    PT Time Calculation (min) 45 min    Activity Tolerance Patient tolerated treatment well    Behavior During Therapy [facility] for tasks assessed/performed           Past Medical History:  Diagnosis Date  . Blind    retinitis pigmentosia started at 64 years of age  . Deafness 9 /24/1956   bilateral    Past Surgical History:  Procedure Laterality Date  . ABDOMINAL HYSTERECTOMY  1986   nonmalignant reasons  . CESAREAN SECTION    . COSMETIC SURGERY    . TONSILECTOMY, ADENOIDECTOMY, BILATERAL MYRINGOTOMY AND TUBES Bilateral 1968  . TUBAL LIGATION      There were no vitals filed for this visit.   Subjective Assessment - 04/03/20 0909    Subjective Patient states she has able to perform her exercises, however, states had increased soreness after performing lifting of boxes.    Patient is accompained by: Interpreter   Tactile interpreter: Amy   Pertinent History Blind and Deaf    Limitations Lifting;House hold activities    How long can you sit comfortably? unlimited    How long can you stand comfortably? unlimited    How long can you walk comfortably? unlimited    Patient Stated Goals To decrease pain and be able to reach back and behind her back without pain.    Currently in Pain? No/denies             TREATMENT Therapeutic Exercise Towel pulls behind back - x 10 ; x 7 with isometric holds downward Radial nerve glides - 2 x 15 Lat pull down isometrics in sitting  against manual resistance - 5 sec holds 2 x 10 20# box lifts in standing - x 10  Weight passes behind back -  x 13 5# Shoulder abduction on the R UE - x 15 with wall as OP   Performed exercises to decrease increased shoulder pain and improve AROM      PT Education - 04/03/20 0914    Education Details form/technique with exercise    Person(s) Educated Patient    Methods Explanation;Demonstration    Comprehension Returned demonstration;Verbalized understanding            PT Short Term Goals - 02/29/20 1528      PT SHORT TERM GOAL #1   Title Patient will demonstrate independence with HEP to maximize rehab potential.    Time 3    Period Weeks    Status New    Target Date 03/21/20             PT Long Term Goals - 02/29/20 1529      PT LONG TERM GOAL #1   Title Patient will demonstrate independence with progressive HEP to maintain progress made during POC.    Time 6    Period Weeks    Status New    Target Date 04/11/20  PT LONG TERM GOAL #2   Title Patient will be able to perform ADL's without an increase in pain to show improvement in shoulder strength and stability    Baseline Increase in pain when performing    Time 6    Period Weeks    Status New    Target Date 04/11/20      PT LONG TERM GOAL #3   Title Patient will be able to reach backward and behind back to show improvement in R shoulder ROM to have ability to fasten bra behind back.    Baseline Cannot fast bra behind back must be done in front    Time 6    Period Weeks    Status New    Target Date 04/11/20                 Plan - 04/03/20 0932    Clinical Impression Statement Patient continues to demonstrates improvement in AROM , most notably with shoulder extension and horizontal abduction. Continues to have symptoms aligning with radial nerve irritation, however, able to go throughout a greater pain-free AROM with adapted ULTT3. Patient is making improvements overall and patient will  benefit from further skilled therapy to return to prior level of function.    Personal Factors and Comorbidities Age;Time since onset of injury/illness/exacerbation;Other   Blind and deaf   Examination-Activity Limitations Bathing;Lift;Reach Overhead;Carry    Examination-Participation Restrictions Laundry    Stability/Clinical Decision Making Evolving/Moderate complexity    Rehab Potential Good    PT Frequency 2x / week    PT Duration 6 weeks    PT Treatment/Interventions ADLs/Self Care Home Management;Electrical Stimulation;Iontophoresis 4mg /ml Dexamethasone;Biofeedback;DME Instruction;Gait training;Stair training;Functional mobility training;Therapeutic activities;Therapeutic exercise;Balance training;Neuromuscular re-education;Patient/family education;Manual techniques;Passive range of motion;Dry needling;Energy conservation;Joint Manipulations    PT Next Visit Plan IR ROM and RC strengthening    PT Home Exercise Plan Banded IR and Towel Pull Behind Back    Consulted and Agree with Plan of Care Patient           Patient will benefit from skilled therapeutic intervention in order to improve the following deficits and impairments:  Decreased endurance, Decreased mobility, Increased muscle spasms, Impaired vision/preception, Decreased range of motion, Improper body mechanics, Pain, Impaired UE functional use, Decreased strength, Decreased activity tolerance  Visit Diagnosis: Chronic right shoulder pain  Cramp and spasm     Problem List Patient Active Problem List   Diagnosis Date Noted  . Chronic left hip pain 05/09/2019  . Venous stasis dermatitis of both lower extremities 11/04/2018  . Pruritic erythematous rash 10/24/2018  . Hyperlipidemia 05/03/2018  . Bilateral hand numbness 05/01/2016  . Visit for preventive health examination 04/30/2015  . 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 Overweight (BMI 25.0-29.9) 02/01/2013  .  Constipation, slow transit 02/01/2013    Blythe Stanford, PT DPT 04/03/2020, 9:40 AM  [facility] [address] [place], [address] Phone: [phone]   Fax:  [phone]  Name: Helen Mccoy MRN: [id number] Date of Birth: 12-08-1955

## 2020-04-05 ENCOUNTER — Ambulatory Visit: Payer: PPO | Attending: Student

## 2020-04-05 ENCOUNTER — Other Ambulatory Visit: Payer: Self-pay

## 2020-04-05 DIAGNOSIS — R252 Cramp and spasm: Secondary | ICD-10-CM | POA: Insufficient documentation

## 2020-04-05 DIAGNOSIS — M25511 Pain in right shoulder: Secondary | ICD-10-CM | POA: Diagnosis not present

## 2020-04-05 DIAGNOSIS — G8929 Other chronic pain: Secondary | ICD-10-CM | POA: Insufficient documentation

## 2020-04-05 NOTE — Therapy (Signed)
[facility] [address], [place], [address] Phone: [phone]   Fax:  [phone]  Physical Therapy Treatment  Patient Details  Name: Helen Mccoy MRN: [id number] Date of Birth: 03-Nov-1955 Referring Provider (PT): Damaris Hippo, PA-C   Encounter Date: 04/05/2020   PT End of Session - 04/05/20 1314    Visit Number 9    Number of Visits 13    Date for PT Re-Evaluation 04/11/20    PT Start Time 1300    PT Stop Time 1345    PT Time Calculation (min) 45 min    Activity Tolerance Patient tolerated treatment well    Behavior During Therapy [facility] for tasks assessed/performed           Past Medical History:  Diagnosis Date   Blind    retinitis pigmentosia started at 64 years of age   Deafness 50 /24/1956   bilateral    Past Surgical History:  Procedure Laterality Date   ABDOMINAL HYSTERECTOMY  1986   nonmalignant reasons   CESAREAN SECTION     COSMETIC SURGERY     TONSILECTOMY, ADENOIDECTOMY, BILATERAL MYRINGOTOMY AND TUBES Bilateral 1968   TUBAL LIGATION      There were no vitals filed for this visit.   Subjective Assessment - 04/05/20 1304    Subjective Patient states she can move her arm behind her back to a greater degree compared to previous sessions.    Patient is accompained by: Interpreter   Tactile interpreter: Amy   Pertinent History Blind and Deaf    Limitations Lifting;House hold activities    How long can you sit comfortably? unlimited    How long can you stand comfortably? unlimited    How long can you walk comfortably? unlimited    Patient Stated Goals To decrease pain and be able to reach back and behind her back without pain.    Currently in Pain? No/denies              TREATMENT Therapeutic Exercise Towel pulls behind back - x 10 ;  Radial nerve glides - 2 x 15 Lat pull down isometrics in sitting against manual resistance - 5 sec holds 2 x 10 Weight passes  behind back -  x 13 5# Push ups at wall - x 15 Shoulder abduction in standing - x 15  Performed exercises to decrease increased spasms and pain  Manual therapy STM performed to triceps to decrease increased spsams and pain along the triceps on the affected side.      PT Education - 04/05/20 1314    Education Details form/technique with exercises    Person(s) Educated Patient    Methods Explanation;Demonstration    Comprehension Returned demonstration;Verbalized understanding            PT Short Term Goals - 02/29/20 1528      PT SHORT TERM GOAL #1   Title Patient will demonstrate independence with HEP to maximize rehab potential.    Time 3    Period Weeks    Status New    Target Date 03/21/20             PT Long Term Goals - 02/29/20 1529      PT LONG TERM GOAL #1   Title Patient will demonstrate independence with progressive HEP to maintain progress made during POC.    Time 6    Period Weeks    Status New    Target Date  04/11/20      PT LONG TERM GOAL #2   Title Patient will be able to perform ADL's without an increase in pain to show improvement in shoulder strength and stability    Baseline Increase in pain when performing    Time 6    Period Weeks    Status New    Target Date 04/11/20      PT LONG TERM GOAL #3   Title Patient will be able to reach backward and behind back to show improvement in R shoulder ROM to have ability to fasten bra behind back.    Baseline Cannot fast bra behind back must be done in front    Time 6    Period Weeks    Status New    Target Date 04/11/20                 Plan - 04/05/20 1348    Clinical Impression Statement Increased pain with performing shoulder extension and behind the back reaching is improving with greater amount of movmeent able to be achieved before onset of pain. Althouhg patient is improving, she continues to have increased shoulder pain. Patient will benefit form furhter skilled therapy to return  to prior level of function.    Personal Factors and Comorbidities Age;Time since onset of injury/illness/exacerbation;Other   Blind and deaf   Examination-Activity Limitations Bathing;Lift;Reach Overhead;Carry    Examination-Participation Restrictions Laundry    Stability/Clinical Decision Making Evolving/Moderate complexity    Rehab Potential Good    PT Frequency 2x / week    PT Duration 6 weeks    PT Treatment/Interventions ADLs/Self Care Home Management;Electrical Stimulation;Iontophoresis 4mg /ml Dexamethasone;Biofeedback;DME Instruction;Gait training;Stair training;Functional mobility training;Therapeutic activities;Therapeutic exercise;Balance training;Neuromuscular re-education;Patient/family education;Manual techniques;Passive range of motion;Dry needling;Energy conservation;Joint Manipulations    PT Next Visit Plan IR ROM and RC strengthening    PT Home Exercise Plan Banded IR and Towel Pull Behind Back    Consulted and Agree with Plan of Care Patient           Patient will benefit from skilled therapeutic intervention in order to improve the following deficits and impairments:  Decreased endurance, Decreased mobility, Increased muscle spasms, Impaired vision/preception, Decreased range of motion, Improper body mechanics, Pain, Impaired UE functional use, Decreased strength, Decreased activity tolerance  Visit Diagnosis: Chronic right shoulder pain  Cramp and spasm     Problem List Patient Active Problem List   Diagnosis Date Noted   Chronic left hip pain 05/09/2019   Venous stasis dermatitis of both lower extremities 11/04/2018   Pruritic erythematous rash 10/24/2018   Hyperlipidemia 05/03/2018   Bilateral hand numbness 05/01/2016   Visit for preventive health examination 04/30/2015   S/P TAH (total abdominal hysterectomy) 04/28/2015   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, [place], [address] Phone: [phone]   Fax:  [phone]  Name: Helen Mccoy MRN: [id number] Date of Birth: [birth year]

## 2020-04-07 ENCOUNTER — Telehealth: Payer: Self-pay | Admitting: Internal Medicine

## 2020-04-07 NOTE — Telephone Encounter (Signed)
NO VM/VM FULL. Pt due to schedule Medicare Annual Wellness Visit (AWV)   This should be a telephone visit only=30 minutes.  Last AWV 01/09/18; please schedule at anytime with Denisa O'Brien-Blaney at [facility].  Will need sign lang interpreter

## 2020-04-10 ENCOUNTER — Ambulatory Visit: Payer: PPO

## 2020-04-10 ENCOUNTER — Other Ambulatory Visit: Payer: Self-pay

## 2020-04-10 DIAGNOSIS — R252 Cramp and spasm: Secondary | ICD-10-CM

## 2020-04-10 DIAGNOSIS — G8929 Other chronic pain: Secondary | ICD-10-CM

## 2020-04-10 DIAGNOSIS — M25511 Pain in right shoulder: Secondary | ICD-10-CM | POA: Diagnosis not present

## 2020-04-10 NOTE — Therapy (Signed)
[facility] [address], [place], [address] Phone: [phone]   Fax:  [phone]  Physical Therapy Treatment Physical Therapy Progress Note   Dates of reporting period   02/29/20  to   04/10/20   Patient Details  Name: Helen Mccoy MRN: [id number] Date of Birth: 1956-02-17 Referring Provider (PT): Damaris Hippo, [place]   Encounter Date: 04/10/2020   PT End of Session - 04/10/20 1427    Visit Number 10    Number of Visits 13    Date for PT Re-Evaluation 04/11/20    PT Start Time 1347    PT Stop Time 1427    PT Time Calculation (min) 40 min    Activity Tolerance Patient tolerated treatment well;Patient limited by fatigue;Patient limited by pain    Behavior During Therapy [facility] for tasks assessed/performed           Past Medical History:  Diagnosis Date  . Blind    retinitis pigmentosia started at 64 years of age  . Deafness 9 /24/1956   bilateral    Past Surgical History:  Procedure Laterality Date  . ABDOMINAL HYSTERECTOMY  1986   nonmalignant reasons  . CESAREAN SECTION    . COSMETIC SURGERY    . TONSILECTOMY, ADENOIDECTOMY, BILATERAL MYRINGOTOMY AND TUBES Bilateral 1968  . TUBAL LIGATION      There were no vitals filed for this visit.   Subjective Assessment - 04/10/20 1424    Subjective Pt doing well in general, some fatigue and soreness in RUE after moving some christmas boxes. Pt has been working on her exercises at home. Pt would like to conitnue PT so that he IADL tolerance is back to baseline.    Patient is accompained by: Interpreter   Hermelinda [facility])   Pertinent History Blind and Deaf    Currently in Pain? No/denies              [facility] PT Assessment - 04/10/20 0001      AROM   Right Shoulder Flexion 141 Degrees    Right Shoulder ABduction 146 Degrees    Right Shoulder Internal Rotation 58 Degrees   T10 (painful)    Right Shoulder External Rotation 64  Degrees   T1, painful     Strength   Right Shoulder Flexion 4+/5   not painful   Right Shoulder ABduction 5/5   no pain   Right Shoulder Internal Rotation 5/5   slight posterior shoulder pain   Right Shoulder External Rotation 4+/5   no frank pain   Left Shoulder Flexion 5/5    Left Shoulder ABduction 5/5    Left Shoulder Internal Rotation 5/5    Left Shoulder External Rotation 5/5    Right Elbow Flexion 4+/5    Right Elbow Extension 5/5   mild pain   Left Elbow Flexion 5/5    Left Elbow Extension 5/5           INTERVENTION: -ROM and MMT for reassessment -wand flexion c silver ball 153secH  - Supine RUE ER 1x10 @ 2lb, 1x15 @ 3lb  -bilat isometric shoulder extension into plinth 15x3secH  -standing right shoulder ABDCT to ~90 degrees 1x10       PT Education - 04/10/20 1426    Education Details improvements with reassessment this date.    Methods Explanation    Comprehension Verbalized understanding;Tactile cues required            PT Short Term  Goals - 02/29/20 1528      PT SHORT TERM GOAL #1   Title Patient will demonstrate independence with HEP to maximize rehab potential.    Time 3    Period Weeks    Status New    Target Date 03/21/20             PT Long Term Goals - 04/10/20 1402      PT LONG TERM GOAL #1   Title Patient will demonstrate independence with progressive HEP to maintain progress made during POC.    Baseline needs to be updated again prior to DC    Time 6    Period Weeks    Status On-going    Target Date 04/11/20      PT LONG TERM GOAL #2   Title Patient will be able to perform ADL's without an increase in pain to show improvement in shoulder strength and stability    Baseline Still has pain with bra fastening, but its improving    Time 6    Period Weeks    Status Partially Met      PT LONG TERM GOAL #3   Title Patient will be able to reach backward and behind back to show improvement in R shoulder ROM to have ability to fasten bra  behind back.    Baseline Cannot fast bra behind back must be done in front    Time 6    Period Weeks    Status Partially Met    Target Date 04/11/20      PT LONG TERM GOAL #4   Title Pt able to reports that she is able to perform IADL, actiivty around the house (light duties) without exacerbation in pain.    Baseline pain after lifting boxes for christmas decorating, groceries.    Time 12    Period Weeks    Status New    Target Date 05/11/19                 Plan - 04/10/20 1427    Clinical Impression Statement Reassessment this date: pt noted to show improvements in strength, A/ROM, and P/ROM but still has pain with end range actiivty for ADL, soreness after performance of light house duties, and limited ROM.  Pt reports she has regain much of her independence with ADL adn IADL but is not back to baseline yet. Plan to conitnue PT to achieve treatment goals for return to baseline activity tolerance and independence with IADL.    Personal Factors and Comorbidities Age;Time since onset of injury/illness/exacerbation;Other    Examination-Activity Limitations Bathing;Lift;Reach Overhead;Carry    Examination-Participation Restrictions Laundry    Stability/Clinical Decision Making Evolving/Moderate complexity    Clinical Decision Making Moderate    Rehab Potential Good    PT Frequency 2x / week    PT Duration 6 weeks    PT Treatment/Interventions ADLs/Self Care Home Management;Electrical Stimulation;Iontophoresis 33m/ml Dexamethasone;Biofeedback;DME Instruction;Gait training;Stair training;Functional mobility training;Therapeutic activities;Therapeutic exercise;Balance training;Neuromuscular re-education;Patient/family education;Manual techniques;Passive range of motion;Dry needling;Energy conservation;Joint Manipulations    PT Next Visit Plan progress gentle ROM and shoulder strength program.    PT Home Exercise Plan Banded IR and Towel Pull Behind Back    Consulted and Agree with Plan  of Care Patient           Patient will benefit from skilled therapeutic intervention in order to improve the following deficits and impairments:  Decreased endurance, Decreased mobility, Increased muscle spasms, Impaired vision/preception, Decreased range of motion, Improper body  mechanics, Pain, Impaired UE functional use, Decreased strength, Decreased activity tolerance  Visit Diagnosis: Chronic right shoulder pain  Cramp and spasm     Problem List Patient Active Problem List   Diagnosis Date Noted  . Chronic left hip pain 05/09/2019  . Venous stasis dermatitis of both lower extremities 11/04/2018  . Pruritic erythematous rash 10/24/2018  . Hyperlipidemia 05/03/2018  . Bilateral hand numbness 05/01/2016  . Visit for preventive health examination 04/30/2015  . 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 Overweight (BMI 25.0-29.9) 02/01/2013  . Constipation, slow transit 02/01/2013   2:32 PM, 04/10/20 Etta Grandchild, PT, DPT Physical Therapist - [place] [phone] (Office)    Pharrell Ledford C 04/10/2020, 2:30 PM  [facility] [address] [place], [address] Phone: [phone]   Fax:  [phone]  Name: FLOETTA BRICKEY MRN: [id number] Date of Birth: 03-15-1956

## 2020-04-12 ENCOUNTER — Other Ambulatory Visit: Payer: Self-pay

## 2020-04-12 ENCOUNTER — Ambulatory Visit: Payer: PPO

## 2020-04-12 DIAGNOSIS — M25511 Pain in right shoulder: Secondary | ICD-10-CM

## 2020-04-12 DIAGNOSIS — G8929 Other chronic pain: Secondary | ICD-10-CM

## 2020-04-12 DIAGNOSIS — R252 Cramp and spasm: Secondary | ICD-10-CM

## 2020-04-12 NOTE — Therapy (Signed)
[facility] [address] [place], [address] Phone: [phone]   Fax:  [phone]  Physical Therapy Treatment  Patient Details  Name: Helen Mccoy MRN: [id number] Date of Birth: 1955/07/25 Referring Provider (PT): Damaris Hippo, [place]   Encounter Date: 04/12/2020   PT End of Session - 04/12/20 1410    Visit Number 11    Number of Visits 13    Date for PT Re-Evaluation 04/11/20    PT Start Time 5427    PT Stop Time 1430    PT Time Calculation (min) 45 min    Activity Tolerance Patient tolerated treatment well;Patient limited by fatigue;Patient limited by pain    Behavior During Therapy [facility] for tasks assessed/performed           Past Medical History:  Diagnosis Date  . Blind    retinitis pigmentosia started at 64 years of age  . Deafness 9 /24/1956   bilateral    Past Surgical History:  Procedure Laterality Date  . ABDOMINAL HYSTERECTOMY  1986   nonmalignant reasons  . CESAREAN SECTION    . COSMETIC SURGERY    . TONSILECTOMY, ADENOIDECTOMY, BILATERAL MYRINGOTOMY AND TUBES Bilateral 1968  . TUBAL LIGATION      There were no vitals filed for this visit.   Subjective Assessment - 04/12/20 1352    Subjective Patient states she has been able to reach behind her back easier compared to the previous session.    Patient is accompained by: Interpreter   [facility])   Pertinent History Blind and Deaf    Currently in Pain? No/denies                  TREATMENT Therapeutic Exercise Seated shoulder distraction pulls with hand under table - x 10  Seated shoulder abduction in sitting - 2 x 10  Towel pulls behind back - x 10 ;  Radial nerve glides - 2 x 15 Lat pull down isometrics in sitting against manual resistance - 5 sec holds 2 x 10 Reach across chest tricep stretch - x 10  Corner stretch - x 10    Performed exercises to decrease increased spasms and pain   Manual  therapy STM performed to triceps to decrease increased spsams and pain along the triceps on the affected side.       PT Education - 04/12/20 1405    Education Details form/technique with exercise    Person(s) Educated Patient    Methods Explanation;Demonstration    Comprehension Verbalized understanding;Returned demonstration            PT Short Term Goals - 02/29/20 1528      PT SHORT TERM GOAL #1   Title Patient will demonstrate independence with HEP to maximize rehab potential.    Time 3    Period Weeks    Status New    Target Date 03/21/20             PT Long Term Goals - 04/10/20 1402      PT LONG TERM GOAL #1   Title Patient will demonstrate independence with progressive HEP to maintain progress made during POC.    Baseline needs to be updated again prior to DC    Time 6    Period Weeks    Status On-going    Target Date 04/11/20      PT LONG TERM GOAL #2   Title Patient will be able to perform  ADL's without an increase in pain to show improvement in shoulder strength and stability    Baseline Still has pain with bra fastening, but its improving    Time 6    Period Weeks    Status Partially Met      PT LONG TERM GOAL #3   Title Patient will be able to reach backward and behind back to show improvement in R shoulder ROM to have ability to fasten bra behind back.    Baseline Cannot fast bra behind back must be done in front    Time 6    Period Weeks    Status Partially Met    Target Date 04/11/20      PT LONG TERM GOAL #4   Title Pt able to reports that she is able to perform IADL, actiivty around the house (light duties) without exacerbation in pain.    Baseline pain after lifting boxes for christmas decorating, groceries.    Time 12    Period Weeks    Status New    Target Date 05/11/19                 Plan - 04/12/20 1423    Clinical Impression Statement Continued to focus on improving radial nerve mobility as well as improvement in pure  shoulder abduction. Patient continues to have limitations with performance of behind the back reaching. Patient able to reach ~T9 on affected side vs. T5 on the unaffected. Patient is making improvements and patient will benefit from further skilled therapy to return to prior level of function.    Personal Factors and Comorbidities Age;Time since onset of injury/illness/exacerbation;Other    Examination-Activity Limitations Bathing;Lift;Reach Overhead;Carry    Examination-Participation Restrictions Laundry    Stability/Clinical Decision Making Evolving/Moderate complexity    Rehab Potential Good    PT Frequency 2x / week    PT Duration 6 weeks    PT Treatment/Interventions ADLs/Self Care Home Management;Electrical Stimulation;Iontophoresis 3m/ml Dexamethasone;Biofeedback;DME Instruction;Gait training;Stair training;Functional mobility training;Therapeutic activities;Therapeutic exercise;Balance training;Neuromuscular re-education;Patient/family education;Manual techniques;Passive range of motion;Dry needling;Energy conservation;Joint Manipulations    PT Next Visit Plan progress gentle ROM and shoulder strength program.    PT Home Exercise Plan Banded IR and Towel Pull Behind Back    Consulted and Agree with Plan of Care Patient           Patient will benefit from skilled therapeutic intervention in order to improve the following deficits and impairments:  Decreased endurance, Decreased mobility, Increased muscle spasms, Impaired vision/preception, Decreased range of motion, Improper body mechanics, Pain, Impaired UE functional use, Decreased strength, Decreased activity tolerance  Visit Diagnosis: Cramp and spasm  Chronic right shoulder pain     Problem List Patient Active Problem List   Diagnosis Date Noted  . Chronic left hip pain 05/09/2019  . Venous stasis dermatitis of both lower extremities 11/04/2018  . Pruritic erythematous rash 10/24/2018  . Hyperlipidemia 05/03/2018  .  Bilateral hand numbness 05/01/2016  . Visit for preventive health examination 04/30/2015  . 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 Overweight (BMI 25.0-29.9) 02/01/2013  . Constipation, slow transit 02/01/2013    WBlythe Stanford PT DPT 04/12/2020, 2:32 PM  [facility] [address] [place] [address]Phone: [phone]  Fax:  [phone] Name: Helen STEINRUCKMRN: [id number] of Birth: 9Oct 14, 1957

## 2020-04-17 ENCOUNTER — Other Ambulatory Visit: Payer: Self-pay

## 2020-04-17 ENCOUNTER — Ambulatory Visit: Payer: PPO

## 2020-04-17 DIAGNOSIS — R252 Cramp and spasm: Secondary | ICD-10-CM

## 2020-04-17 DIAGNOSIS — M25511 Pain in right shoulder: Secondary | ICD-10-CM | POA: Diagnosis not present

## 2020-04-17 NOTE — Therapy (Signed)
[employer] [facility] [address] [place], [address] Phone: [phone]   Fax:  [phone]  Physical Therapy Treatment  Patient Details  Name: Helen Mccoy MRN: [id number] Date of Birth: [birth year] Referring Provider (PT): Damaris Hippo, [place]   Encounter Date: 04/17/2020   PT End of Session - 04/17/20 1317    Visit Number 12    Number of Visits 13    Date for PT Re-Evaluation 05/10/20    PT Start Time 1300    PT Stop Time 1345    PT Time Calculation (min) 45 min    Activity Tolerance Patient tolerated treatment well;Patient limited by fatigue;Patient limited by pain    Behavior During Therapy [facility] for tasks assessed/performed           Past Medical History:  Diagnosis Date  . Blind    retinitis pigmentosia started at 64 years of age  . Deafness 9 /24/1956   bilateral    Past Surgical History:  Procedure Laterality Date  . ABDOMINAL HYSTERECTOMY  1986   nonmalignant reasons  . CESAREAN SECTION    . COSMETIC SURGERY    . TONSILECTOMY, ADENOIDECTOMY, BILATERAL MYRINGOTOMY AND TUBES Bilateral 1968  . TUBAL LIGATION      There were no vitals filed for this visit.   Subjective Assessment - 04/17/20 1305    Subjective Patient reports she has not been having any pain. However continues to have difficulty with reaching behind her back.    Patient is accompained by: Interpreter   [facility])   Pertinent History Blind and Deaf    Patient Stated Goals To decrease pain and be able to reach back and behind her back without pain.    Currently in Pain? No/denies               TREATMENT Therapeutic Exercise Seated shoulder distraction pulls with hand under table - x 10  Seated shoulder abduction in sitting - 2 x 10  Towel pulls behind back - 2 x 10 ;  Weight passes behind back - 2 x 10  Radial nerve glides - 2 x 15 Lat pull down isometrics in sitting against manual resistance - 5 sec  holds 2 x 10 Corner stretch - x 10    Performed exercises to decrease increased spasms and pain       PT Education - 04/17/20 1308    Education Details form/technique with exercise    Person(s) Educated Patient    Methods Explanation;Demonstration    Comprehension Verbalized understanding;Returned demonstration            PT Short Term Goals - 02/29/20 1528      PT SHORT TERM GOAL #1   Title Patient will demonstrate independence with HEP to maximize rehab potential.    Time 3    Period Weeks    Status New    Target Date 03/21/20             PT Long Term Goals - 04/10/20 1402      PT LONG TERM GOAL #1   Title Patient will demonstrate independence with progressive HEP to maintain progress made during POC.    Baseline needs to be updated again prior to DC    Time 6    Period Weeks    Status On-going    Target Date 04/11/20      PT LONG TERM GOAL #2   Title Patient will be able to perform  ADL's without an increase in pain to show improvement in shoulder strength and stability    Baseline Still has pain with bra fastening, but its improving    Time 6    Period Weeks    Status Partially Met      PT LONG TERM GOAL #3   Title Patient will be able to reach backward and behind back to show improvement in R shoulder ROM to have ability to fasten bra behind back.    Baseline Cannot fast bra behind back must be done in front    Time 6    Period Weeks    Status Partially Met    Target Date 04/11/20      PT LONG TERM GOAL #4   Title Pt able to reports that she is able to perform IADL, actiivty around the house (light duties) without exacerbation in pain.    Baseline pain after lifting boxes for christmas decorating, groceries.    Time 12    Period Weeks    Status New    Target Date 05/11/19                 Plan - 04/17/20 1324    Clinical Impression Statement Focused on improving behind the back motion during today's session. Patient able to reach behind  her back to a greater extent compared to previous sessions. Patient is making progress and will benefit from further skilled therapy to return to prior level of function.    Personal Factors and Comorbidities Age;Time since onset of injury/illness/exacerbation;Other    Examination-Activity Limitations Bathing;Lift;Reach Overhead;Carry    Examination-Participation Restrictions Laundry    Stability/Clinical Decision Making Evolving/Moderate complexity    Rehab Potential Good    PT Frequency 2x / week    PT Duration 6 weeks    PT Treatment/Interventions ADLs/Self Care Home Management;Electrical Stimulation;Iontophoresis 75m/ml Dexamethasone;Biofeedback;DME Instruction;Gait training;Stair training;Functional mobility training;Therapeutic activities;Therapeutic exercise;Balance training;Neuromuscular re-education;Patient/family education;Manual techniques;Passive range of motion;Dry needling;Energy conservation;Joint Manipulations    PT Next Visit Plan progress gentle ROM and shoulder strength program.    PT Home Exercise Plan Banded IR and Towel Pull Behind Back    Consulted and Agree with Plan of Care Patient           Patient will benefit from skilled therapeutic intervention in order to improve the following deficits and impairments:  Decreased endurance,Decreased mobility,Increased muscle spasms,Impaired vision/preception,Decreased range of motion,Improper body mechanics,Pain,Impaired UE functional use,Decreased strength,Decreased activity tolerance  Visit Diagnosis: Cramp and spasm  Chronic right shoulder pain     Problem List Patient Active Problem List   Diagnosis Date Noted  . Chronic left hip pain 05/09/2019  . Venous stasis dermatitis of both lower extremities 11/04/2018  . Pruritic erythematous rash 10/24/2018  . Hyperlipidemia 05/03/2018  . Bilateral hand numbness 05/01/2016  . Visit for preventive health examination 04/30/2015  . S/P TAH (total abdominal hysterectomy)  04/28/2015  . 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 Overweight (BMI 25.0-29.9) 02/01/2013  . Constipation, slow transit 02/01/2013    WBlythe Stanford PT DPT 04/17/2020, 1:44 PM  [facility] [address] [place] [address]Phone: [phone]  Fax:  [phone] Name: Helen PIKEMRN: [id number] of Birth: [birth year]

## 2020-04-17 NOTE — Addendum Note (Signed)
Addended by: Blain Pais on: 04/17/2020 01:17 PM   Modules accepted: Orders

## 2020-04-17 NOTE — Addendum Note (Signed)
Addended by: Blain Pais on: 04/17/2020 01:15 PM   Modules accepted: Orders

## 2020-04-19 ENCOUNTER — Other Ambulatory Visit: Payer: Self-pay

## 2020-04-19 ENCOUNTER — Ambulatory Visit: Payer: PPO

## 2020-04-19 DIAGNOSIS — R252 Cramp and spasm: Secondary | ICD-10-CM

## 2020-04-19 DIAGNOSIS — M25511 Pain in right shoulder: Secondary | ICD-10-CM

## 2020-04-19 NOTE — Therapy (Signed)
[facility] [address], [place], [address] Phone: [phone]   Fax:  [phone]  Physical Therapy Treatment  Patient Details  Name: Helen Mccoy MRN: [id number] Date of Birth: 1956-01-20 Referring Provider (PT): Damaris Hippo, PA-C   Encounter Date: 04/19/2020   PT End of Session - 04/19/20 1310    Visit Number 13    Number of Visits 13    Date for PT Re-Evaluation 05/10/20    PT Start Time 1302    PT Stop Time 1345    PT Time Calculation (min) 43 min    Activity Tolerance Patient tolerated treatment well;Patient limited by fatigue;Patient limited by pain    Behavior During Therapy [facility] for tasks assessed/performed           Past Medical History:  Diagnosis Date   Blind    retinitis pigmentosia started at 64 years of age   Deafness 6 /24/1956   bilateral    Past Surgical History:  Procedure Laterality Date   ABDOMINAL HYSTERECTOMY  1986   nonmalignant reasons   CESAREAN SECTION     COSMETIC SURGERY     TONSILECTOMY, ADENOIDECTOMY, BILATERAL MYRINGOTOMY AND TUBES Bilateral 1968   TUBAL LIGATION      There were no vitals filed for this visit.   Subjective Assessment - 04/19/20 1305    Subjective Patient states she continues to not have pain with movement. Patient with improvement but continues to have increased difficulty with reaching behind her back.    Patient is accompained by: Interpreter   [facility])   Pertinent History Blind and Deaf    Patient Stated Goals To decrease pain and be able to reach back and behind her back without pain.    Currently in Pain? No/denies                        TREATMENT Therapeutic Exercise Seated shoulder distraction pulls with hand under table - x 10  Towel pulls behind back - 2 x 10 ;  Weight passes behind back - 2 x 10  Radial nerve glides - 2 x 15 Horizontal abduction in standing B with RTB - x 10   Corner stretch - x 10    Performed exercises to decrease increased spasms and pain        PT Education - 04/19/20 1309    Education Details form/technique with exercise    Person(s) Educated Patient    Methods Explanation;Demonstration    Comprehension Verbalized understanding;Returned demonstration            PT Short Term Goals - 02/29/20 1528      PT SHORT TERM GOAL #1   Title Patient will demonstrate independence with HEP to maximize rehab potential.    Time 3    Period Weeks    Status New    Target Date 03/21/20             PT Long Term Goals - 04/19/20 1328      PT LONG TERM GOAL #1   Title Patient will demonstrate independence with progressive HEP to maintain progress made during POC.    Baseline needs to be updated again prior to DC; 04/19/2020: Independent with HEP    Time 6    Period Weeks    Status Achieved      PT LONG TERM GOAL #2   Title Patient will be able to perform ADL's without  an increase in pain to show improvement in shoulder strength and stability    Baseline Still has pain with bra fastening, but its improving; 04/19/2020: Able to perform with little to no cueing required    Time 6    Period Weeks    Status Achieved      PT LONG TERM GOAL #3   Title Patient will be able to reach backward and behind back to show improvement in R shoulder ROM to have ability to fasten bra behind back.    Baseline Cannot fast bra behind back must be done in front; 04/19/2020: Able to fasten bra    Time 6    Period Weeks    Status Achieved      PT LONG TERM GOAL #4   Title Pt able to reports that she is able to perform IADL, actiivty around the house (light duties) without exacerbation in pain.    Baseline pain after lifting boxes for christmas decorating, groceries.; 04/19/2020: able to lift heavy items without increase in pain    Time 12    Period Weeks    Status Achieved                 Plan - 04/19/20 1315    Clinical Impression  Statement Reinforced behind the back movement to be added to HEP to ensure patient conitnues to make progress at home with home program. Patient has met or partially met all ong term goals and will be discharged from physical therapy.    Personal Factors and Comorbidities Age;Time since onset of injury/illness/exacerbation;Other    Examination-Activity Limitations Bathing;Lift;Reach Overhead;Carry    Examination-Participation Restrictions Laundry    Stability/Clinical Decision Making Evolving/Moderate complexity    Rehab Potential Good    PT Frequency 2x / week    PT Duration 6 weeks    PT Treatment/Interventions ADLs/Self Care Home Management;Electrical Stimulation;Iontophoresis 88m/ml Dexamethasone;Biofeedback;DME Instruction;Gait training;Stair training;Functional mobility training;Therapeutic activities;Therapeutic exercise;Balance training;Neuromuscular re-education;Patient/family education;Manual techniques;Passive range of motion;Dry needling;Energy conservation;Joint Manipulations    PT Next Visit Plan progress gentle ROM and shoulder strength program.    PT Home Exercise Plan Banded IR and Towel Pull Behind Back    Consulted and Agree with Plan of Care Patient           Patient will benefit from skilled therapeutic intervention in order to improve the following deficits and impairments:  Decreased endurance,Decreased mobility,Increased muscle spasms,Impaired vision/preception,Decreased range of motion,Improper body mechanics,Pain,Impaired UE functional use,Decreased strength,Decreased activity tolerance  Visit Diagnosis: Cramp and spasm  Chronic right shoulder pain     Problem List Patient Active Problem List   Diagnosis Date Noted   Chronic left hip pain 05/09/2019   Venous stasis dermatitis of both lower extremities 11/04/2018   Pruritic erythematous rash 10/24/2018   Hyperlipidemia 05/03/2018   Bilateral hand numbness 05/01/2016   Visit for preventive health  examination 04/30/2015   S/P TAH (total abdominal hysterectomy) 04/28/2015   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 [place] [address]Phone: [phone]  Fax:  [phone] Name: Helen BAKULAMRN: [id number] of Birth: [birth year]

## 2020-05-08 ENCOUNTER — Other Ambulatory Visit: Payer: PPO

## 2020-05-10 ENCOUNTER — Other Ambulatory Visit: Payer: Self-pay

## 2020-05-10 ENCOUNTER — Ambulatory Visit (INDEPENDENT_AMBULATORY_CARE_PROVIDER_SITE_OTHER): Payer: PPO | Admitting: Internal Medicine

## 2020-05-10 ENCOUNTER — Encounter: Payer: Self-pay | Admitting: Internal Medicine

## 2020-05-10 VITALS — BP 114/72 | HR 62 | Temp 98.1°F | Resp 15 | Ht 63.0 in | Wt 185.6 lb

## 2020-05-10 DIAGNOSIS — Z23 Encounter for immunization: Secondary | ICD-10-CM

## 2020-05-10 DIAGNOSIS — Z0000 Encounter for general adult medical examination without abnormal findings: Secondary | ICD-10-CM | POA: Diagnosis not present

## 2020-05-10 DIAGNOSIS — Z1211 Encounter for screening for malignant neoplasm of colon: Secondary | ICD-10-CM

## 2020-05-10 DIAGNOSIS — E782 Mixed hyperlipidemia: Secondary | ICD-10-CM

## 2020-05-10 LAB — CBC WITH DIFFERENTIAL/PLATELET
Basophils Absolute: 0 10*3/uL (ref 0.0–0.1)
Basophils Relative: 0.4 % (ref 0.0–3.0)
Eosinophils Absolute: 0.1 10*3/uL (ref 0.0–0.7)
Eosinophils Relative: 1.5 % (ref 0.0–5.0)
HCT: 36.8 % (ref 36.0–46.0)
Hemoglobin: 12.4 g/dL (ref 12.0–15.0)
Lymphocytes Relative: 39.5 % (ref 12.0–46.0)
Lymphs Abs: 2.2 10*3/uL (ref 0.7–4.0)
MCHC: 33.8 g/dL (ref 30.0–36.0)
MCV: 92.6 fl (ref 78.0–100.0)
Monocytes Absolute: 0.6 10*3/uL (ref 0.1–1.0)
Monocytes Relative: 10.8 % (ref 3.0–12.0)
Neutro Abs: 2.7 10*3/uL (ref 1.4–7.7)
Neutrophils Relative %: 47.8 % (ref 43.0–77.0)
Platelets: 283 10*3/uL (ref 150.0–400.0)
RBC: 3.97 Mil/uL (ref 3.87–5.11)
RDW: 12.7 % (ref 11.5–15.5)
WBC: 5.5 10*3/uL (ref 4.0–10.5)

## 2020-05-10 LAB — COMPREHENSIVE METABOLIC PANEL
ALT: 13 U/L (ref 0–35)
AST: 23 U/L (ref 0–37)
Albumin: 4.3 g/dL (ref 3.5–5.2)
Alkaline Phosphatase: 71 U/L (ref 39–117)
BUN: 17 mg/dL (ref 6–23)
CO2: 31 mEq/L (ref 19–32)
Calcium: 9.4 mg/dL (ref 8.4–10.5)
Chloride: 104 mEq/L (ref 96–112)
Creatinine, Ser: 0.78 mg/dL (ref 0.40–1.20)
GFR: 79.78 mL/min (ref >60.00)
Glucose, Bld: 87 mg/dL (ref 70–99)
Potassium: 4.4 mEq/L (ref 3.5–5.1)
Sodium: 140 mEq/L (ref 135–145)
Total Bilirubin: 0.5 mg/dL (ref 0.2–1.2)
Total Protein: 7.2 g/dL (ref 6.0–8.3)

## 2020-05-10 LAB — HEMOGLOBIN A1C: Hgb A1c MFr Bld: 5.5 % (ref 4.6–6.5)

## 2020-05-10 LAB — LIPID PANEL
Cholesterol: 201 mg/dL — ABNORMAL HIGH (ref 0–200)
HDL: 73.9 mg/dL (ref >39.00)
LDL Cholesterol: 112 mg/dL — ABNORMAL HIGH (ref 0–99)
NonHDL: 126.6
Total CHOL/HDL Ratio: 3
Triglycerides: 72 mg/dL (ref 0.0–149.0)
VLDL: 14.4 mg/dL (ref 0.0–40.0)

## 2020-05-10 LAB — TSH: TSH: 2.65 u[IU]/mL (ref 0.35–4.50)

## 2020-05-10 NOTE — Progress Notes (Signed)
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    reports that she quit smoking about 21 years ago. Her smoking use included cigarettes. She smoked 1.00 pack per day. She has never used smokeless tobacco. She reports current alcohol use. She reports that she does not use drugs.   The roster of all physicians providing medical care to patient - is listed in the Snapshot section of the chart.  Activities of daily living:  The patient is 100% independent in all ADLs: dressing, toileting, feeding as well as independent mobility Fall risk was assessed by direct patient evaluation of patient's balance, gait and ability to risk from a chair and from a kneeling position.  No falls since last year when she fell down a flight of stairs Home safety : The patient has smoke detectors in the home. They wear seatbelts.  There are no firearms at home. There is no violence in the home.  Patient has NOT seen their eye doctor in the last year as she is blind    Visual acuity was NOT assessed today   Patient is deaf  And mute.   There is no risks for hepatitis, STDs or HIV. There is no   history of blood transfusion. They have no travel history to infectious disease endemic areas of the world.  The patient has seen their dentist in the last six months  They do not  have excessive sun exposure. Discussed the need for sun protection: hats, long sleeves and use of sunscreen if there is significant sun exposure.   Diet: the importance of a healthy diet is discussed. They do have a healthy diet.  The benefits of regular aerobic exercise were discussed. She has been historically sedentary but has purchased a rowing machine and plans to start using it.   Depression screen:   Depression screen [facility] 2/9 05/10/2020 01/09/2018 01/08/2017 05/01/2016  Decreased Interest 0 0 0 0  Down, Depressed, Hopeless 0 0 0 0  PHQ - 2 Score 0 0 0  0     Cognitive assessment: the patient's husband manages all their financial and personal affairs because of her disability (blindness)  and is actively engaged. They could relate day,date,year and events; recalled 2/3 objects at 3 minutes; performed clock-face test normally.  The following portions of the patient's history were reviewed and updated as appropriate: allergies, current medications, past family history, past medical history,  past surgical history, past social history  and problem list.  Visual acuity was not assessed per patient preference since she is BLIND AND DEAF/MUTE. body mass index were assessed and reviewed.  She has gained 20 lbs since 2019.  She bakes cookies and eats a lot of sweets, because she is bored due to social restrictions  .  Her partner Romeo Apple of 17 years is a Publishing rights manager.    During the course of the visit the patient was educated and counseled about appropriate screening and preventive services including : fall prevention , diabetes screening, nutrition counseling, colorectal cancer screening, and recommended immunizations.    Vital Signs: BP 114/72 (BP Location: Left Arm, Patient Position: Sitting, Cuff Size: Normal)   Pulse 62   Temp 98.1 F (36.7 C) (Oral)   Resp 15   Ht 5\' 3"  (1.6 m)   Wt 185 lb 9.6 oz (84.2 kg)   SpO2 98%   BMI 32.88 kg/m  Exam: General appearance: alert, cooperative and appears stated age Head: Normocephalic, without obvious abnormality, atraumatic Eyes: conjunctivae/corneas clear. PERRL, EOM's intact. Fundi benign. Ears: normal TM's and external ear canals both ears Nose: Nares normal. Septum midline. Mucosa normal. No drainage or sinus tenderness. Throat: lips, mucosa, and tongue normal; teeth and gums normal Neck: no adenopathy, no carotid bruit, no JVD, supple, symmetrical, trachea midline and thyroid not enlarged, symmetric, no tenderness/mass/nodules Lungs: clear to auscultation bilaterally Breasts: normal appearance, no  masses or tenderness Heart: regular rate and rhythm, S1, S2 normal, no murmur, click, rub or gallop Abdomen: soft, non-tender; bowel sounds normal; no masses,  no organomegaly Extremities: extremities normal, atraumatic, no cyanosis or edema Pulses: 2+ and symmetric Skin: Skin color, texture, turgor normal. No rashes or lesions Neurologic: Alert and oriented X 3, normal strength and tone. Normal symmetric reflexes. Normal coordination and gait.     End of Life Discussion and Planning  During the course of the visit , End of Life objectives were discussed at length.  Patient does not have a living will in place or a healthcare power of attorney.  Patient's interpretor  was given printed information about advance directives and encouraged to return after discussing with their family.  Review of Opioid Prescriptions    Patient does not have a current opioid prescription Patient's risk factors for opioid use disorder was reviewed and includes NO RISK FACTORS  Treatment of pain using non-opioid alternatives was reviewed  and encouraged   Patient risk for potential substance abuse was assessed and addressed with counselling.   Welcome to Medicare preventive visit age appropriate education and counseling updated, referrals for preventative services and immunizations addressed, dietary and smoking counseling addressed, most recent labs reviewed.  I have ordered and reviewed a 12 lead EKG and find that there are no acute changes and patient is in sinus rhythm.   I have personally reviewed and have noted:  1) the patient's medical and social history 2) The pt's use of alcohol, tobacco, and illicit drugs 3) The patient's current medications and supplements 4) Functional ability including ADL's, fall risk, home safety risk, hearing and visual impairment 5) Diet and physical activities 6) Evidence for depression or mood disorder 7) The patient's height, weight, and BMI have been recorded in the  chart  I have made referrals, and provided counseling and education based on review of the above   Updated Medication List Outpatient Encounter Medications as of 05/10/2020  Medication Sig  . Calcium Carbonate-Vitamin D 500-125 MG-UNIT TABS Take 1 tablet by mouth daily.  . hydrocortisone 2.5 % ointment   . triamcinolone ointment (KENALOG) 0.1 % APP EXT AA BID PRN  . [DISCONTINUED] fexofenadine (ALLEGRA) 180 MG tablet Take 1 tablet (180 mg total) by mouth 2 (two) times daily at 8 am and 10 pm. (Patient not taking: No sig reported)   No facility-administered encounter medications on file as of 05/10/2020.    Welcome to Medicare preventive visit age appropriate education and counseling updated, referrals for preventative services and immunizations addressed, dietary and smoking counseling addressed, most recent labs reviewed.  I have ordered and reviewed a 12 lead EKG and find that there are no acute changes and patient is in sinus rhythm.   I have personally reviewed and have noted:  1) the patient's medical and social history 2) The pt's use of alcohol, tobacco, and illicit drugs 3) The patient's current medications and supplements 4) Functional ability including ADL's, fall risk, home safety risk, hearing  and visual impairment 5) Diet and physical activities 6) Evidence for depression or mood disorder 7) The patient's height, weight, and BMI have been recorded in the chart  I have made referrals, and provided counseling and education based on review of the above   Updated Medication List Outpatient Encounter Medications as of 05/10/2020  Medication Sig  . Calcium Carbonate-Vitamin D 500-125 MG-UNIT TABS Take 1 tablet by mouth daily.  . hydrocortisone 2.5 % ointment   . triamcinolone ointment (KENALOG) 0.1 % APP EXT AA BID PRN  . [DISCONTINUED] fexofenadine (ALLEGRA) 180 MG tablet Take 1 tablet (180 mg total) by mouth 2 (two) times daily at 8 am and 10 pm. (Patient not taking: No sig  reported)   No facility-administered encounter medications on file as of 05/10/2020.

## 2020-05-10 NOTE — Patient Instructions (Signed)
You received the Prevnar vaccine for Pneumonia prevention  If it causes a sore, red arm, this is common. You can take tylenol and/or motrin for the discomfort  We will send a copy of the labs to your home by mail  Please consider designating a "healthcare power of attorney"  And getting a living will    Health Maintenance After Age 67 After age 22, you are at a higher risk for certain long-term diseases and infections as well as injuries from falls. Falls are a major cause of broken bones and head injuries in people who are older than age 15. Getting regular preventive care can help to keep you healthy and well. Preventive care includes getting regular testing and making lifestyle changes as recommended by your health care provider. Talk with your health care provider about:  Which screenings and tests you should have. A screening is a test that checks for a disease when you have no symptoms.  A diet and exercise plan that is right for you. What should I know about screenings and tests to prevent falls? Screening and testing are the best ways to find a health problem early. Early diagnosis and treatment give you the best chance of managing medical conditions that are common after age 39. Certain conditions and lifestyle choices may make you more likely to have a fall. Your health care provider may recommend:  Regular vision checks. Poor vision and conditions such as cataracts can make you more likely to have a fall. If you wear glasses, make sure to get your prescription updated if your vision changes.  Medicine review. Work with your health care provider to regularly review all of the medicines you are taking, including over-the-counter medicines. Ask your health care provider about any side effects that may make you more likely to have a fall. Tell your health care provider if any medicines that you take make you feel dizzy or sleepy.  Osteoporosis screening. Osteoporosis is a condition that  causes the bones to get weaker. This can make the bones weak and cause them to break more easily.  Blood pressure screening. Blood pressure changes and medicines to control blood pressure can make you feel dizzy.  Strength and balance checks. Your health care provider may recommend certain tests to check your strength and balance while standing, walking, or changing positions.  Foot health exam. Foot pain and numbness, as well as not wearing proper footwear, can make you more likely to have a fall.  Depression screening. You may be more likely to have a fall if you have a fear of falling, feel emotionally low, or feel unable to do activities that you used to do.  Alcohol use screening. Using too much alcohol can affect your balance and may make you more likely to have a fall. What actions can I take to lower my risk of falls? General instructions  Talk with your health care provider about your risks for falling. Tell your health care provider if: ? You fall. Be sure to tell your health care provider about all falls, even ones that seem minor. ? You feel dizzy, sleepy, or off-balance.  Take over-the-counter and prescription medicines only as told by your health care provider. These include any supplements.  Eat a healthy diet and maintain a healthy weight. A healthy diet includes low-fat dairy products, low-fat (lean) meats, and fiber from whole grains, beans, and lots of fruits and vegetables. Home safety  Remove any tripping hazards, such as rugs, cords, and  clutter.  Install safety equipment such as grab bars in bathrooms and safety rails on stairs.  Keep rooms and walkways well-lit. Activity   Follow a regular exercise program to stay fit. This will help you maintain your balance. Ask your health care provider what types of exercise are appropriate for you.  If you need a cane or walker, use it as recommended by your health care provider.  Wear supportive shoes that have nonskid  soles. Lifestyle  Do not drink alcohol if your health care provider tells you not to drink.  If you drink alcohol, limit how much you have: ? 0-1 drink a day for women. ? 0-2 drinks a day for men.  Be aware of how much alcohol is in your drink. In the [place] one drink equals one typical bottle of beer (12 oz), one-half glass of wine (5 oz), or one shot of hard liquor (1 oz).  Do not use any products that contain nicotine or tobacco, such as cigarettes and e-cigarettes. If you need help quitting, ask your health care provider. Summary  Having a healthy lifestyle and getting preventive care can help to protect your health and wellness after age 44.  Screening and testing are the best way to find a health problem early and help you avoid having a fall. Early diagnosis and treatment give you the best chance for managing medical conditions that are more common for people who are older than age 58.  Falls are a major cause of broken bones and head injuries in people who are older than age 36. Take precautions to prevent a fall at home.  Work with your health care provider to learn what changes you can make to improve your health and wellness and to prevent falls. This information is not intended to replace advice given to you by your health care provider. Make sure you discuss any questions you have with your health care provider. Document Revised: 08/13/2018 Document Reviewed: 03/05/2017 Elsevier Patient Education  2020 [employer].

## 2020-05-11 NOTE — Assessment & Plan Note (Addendum)
age appropriate education and counseling updated, referrals for preventative services and immunizations addressed, dietary and smoking counseling addressed, most recent labs reviewed. I have ordered and reviewed a 12 lead EKG and find that there are no acute changes and patient is in sinus rhythm.   I have personally reviewed and have noted:  1) the patient's medical and social history 2) The pt's use of alcohol, tobacco, and illicit drugs 3) The patient's current medications and supplements 4) Functional ability including ADL's, fall risk, home safety risk, hearing and visual impairment 5) Diet and physical activities 6) Evidence for depression or mood disorder 7) The patient's height, weight, and BMI have been recorded in the chart  I have made referrals, and provided counseling and education based on review of the above 

## 2020-05-11 NOTE — Progress Notes (Signed)
Please mail the results with the following comments   Your CBC, thyroid ,cholesterol,  liver and kidney function are normal.  You are not anemic or prediabetic    Please Plan to repeat fasting labs in 1 year.   Regards,  Dr. Darrick Huntsman

## 2020-05-19 ENCOUNTER — Other Ambulatory Visit: Payer: Self-pay

## 2020-05-19 ENCOUNTER — Other Ambulatory Visit: Payer: Self-pay | Admitting: Internal Medicine

## 2020-05-19 ENCOUNTER — Telehealth (INDEPENDENT_AMBULATORY_CARE_PROVIDER_SITE_OTHER): Payer: Self-pay | Admitting: Gastroenterology

## 2020-05-19 DIAGNOSIS — Z1211 Encounter for screening for malignant neoplasm of colon: Secondary | ICD-10-CM

## 2020-05-19 DIAGNOSIS — Z1231 Encounter for screening mammogram for malignant neoplasm of breast: Secondary | ICD-10-CM

## 2020-05-19 MED ORDER — NA SULFATE-K SULFATE-MG SULF 17.5-3.13-1.6 GM/177ML PO SOLN: 1.0000 | Freq: Once | ORAL | 0 refills | Status: AC

## 2020-05-19 NOTE — Progress Notes (Signed)
Gastroenterology Pre-Procedure Review  Request Date: Thursday 06/08/20 Requesting Physician: Dr. Bonna Gains  PATIENT REVIEW QUESTIONS: The patient responded to the following health history questions as indicated:    1. Are you having any GI issues? no 2. Do you have a personal history of Polyps? no 3. Do you have a family history of Colon Cancer or Polyps? no 4. Diabetes Mellitus? no 5. Joint replacements in the past 12 months?no 6. Major health problems in the past 3 months?no 7. Any artificial heart valves, MVP, or defibrillator?no    MEDICATIONS & ALLERGIES:    Patient reports the following regarding taking any anticoagulation/antiplatelet therapy:   Plavix, Coumadin, Eliquis, Xarelto, Lovenox, Pradaxa, Brilinta, or Effient? no Aspirin? no  Patient confirms/reports the following medications:  Current Outpatient Medications  Medication Sig Dispense Refill  . Calcium Carbonate-Vitamin D 500-125 MG-UNIT TABS Take 1 tablet by mouth daily.    Marland Kitchen triamcinolone ointment (KENALOG) 0.1 % APP EXT AA BID PRN    . hydrocortisone 2.5 % ointment  (Patient not taking: No sig reported)     No current facility-administered medications for this visit.    Patient confirms/reports the following allergies:  Allergies  Allergen Reactions  . Sulfa Antibiotics Nausea And Vomiting    No orders of the defined types were placed in this encounter.   AUTHORIZATION INFORMATION Primary Insurance: 1D#: Group #:  Secondary Insurance: 1D#: Group #:  SCHEDULE INFORMATION: Date: 06/08/20 Time: Location:[facility]

## 2020-06-05 ENCOUNTER — Telehealth: Payer: Self-pay | Admitting: Gastroenterology

## 2020-06-05 NOTE — Telephone Encounter (Signed)
Please call patient's Mom, she is confused on directions for her daughter's procedure

## 2020-06-05 NOTE — Telephone Encounter (Signed)
Called patient's mother and I was able to [facility]-19 test. She understood and had no further questions.

## 2020-06-06 ENCOUNTER — Other Ambulatory Visit
Admission: RE | Admit: 2020-06-06 | Discharge: 2020-06-06 | Disposition: A | Discharge status: Home / Self Care | Payer: PPO | Source: Ambulatory Visit | Attending: Gastroenterology | Admitting: Gastroenterology

## 2020-06-06 ENCOUNTER — Other Ambulatory Visit: Payer: Self-pay

## 2020-06-06 DIAGNOSIS — Z20822 Contact with and (suspected) exposure to covid-19: Secondary | ICD-10-CM | POA: Insufficient documentation

## 2020-06-06 DIAGNOSIS — Z01812 Encounter for preprocedural laboratory examination: Secondary | ICD-10-CM | POA: Diagnosis not present

## 2020-06-06 LAB — SARS CORONAVIRUS 2 (TAT 6-24 HRS): SARS Coronavirus 2: NEGATIVE

## 2020-06-07 ENCOUNTER — Encounter: Payer: Self-pay | Admitting: Gastroenterology

## 2020-06-08 ENCOUNTER — Other Ambulatory Visit: Payer: Self-pay

## 2020-06-08 ENCOUNTER — Ambulatory Visit
Admission: RE | Admit: 2020-06-08 | Discharge: 2020-06-08 | Disposition: A | Discharge status: Home / Self Care | Payer: PPO | Attending: Gastroenterology | Admitting: Gastroenterology

## 2020-06-08 ENCOUNTER — Ambulatory Visit: Payer: PPO | Admitting: Certified Registered Nurse Anesthetist

## 2020-06-08 ENCOUNTER — Encounter: Payer: Self-pay | Admitting: Gastroenterology

## 2020-06-08 ENCOUNTER — Encounter
Admission: RE | Disposition: A | Discharge status: Home / Self Care | Payer: Self-pay | Source: Home / Self Care | Attending: Gastroenterology

## 2020-06-08 DIAGNOSIS — D120 Benign neoplasm of cecum: Secondary | ICD-10-CM | POA: Diagnosis not present

## 2020-06-08 DIAGNOSIS — D122 Benign neoplasm of ascending colon: Secondary | ICD-10-CM | POA: Diagnosis not present

## 2020-06-08 DIAGNOSIS — Z882 Allergy status to sulfonamides status: Secondary | ICD-10-CM | POA: Diagnosis not present

## 2020-06-08 DIAGNOSIS — K635 Polyp of colon: Secondary | ICD-10-CM

## 2020-06-08 DIAGNOSIS — Z87891 Personal history of nicotine dependence: Secondary | ICD-10-CM | POA: Insufficient documentation

## 2020-06-08 DIAGNOSIS — Z1211 Encounter for screening for malignant neoplasm of colon: Secondary | ICD-10-CM | POA: Diagnosis not present

## 2020-06-08 DIAGNOSIS — D123 Benign neoplasm of transverse colon: Secondary | ICD-10-CM | POA: Diagnosis not present

## 2020-06-08 DIAGNOSIS — Z0000 Encounter for general adult medical examination without abnormal findings: Secondary | ICD-10-CM

## 2020-06-08 HISTORY — PX: COLONOSCOPY WITH PROPOFOL: SHX5780

## 2020-06-08 SURGERY — COLONOSCOPY WITH PROPOFOL
Anesthesia: General

## 2020-06-08 MED ORDER — DEXMEDETOMIDINE (PRECEDEX) IN NS 20 MCG/5ML (4 MCG/ML) IV SYRINGE
PREFILLED_SYRINGE | INTRAVENOUS | Status: DC | PRN
  Administered 2020-06-08 (×3): 4 ug via INTRAVENOUS

## 2020-06-08 MED ORDER — GLYCOPYRROLATE 0.2 MG/ML IJ SOLN
INTRAMUSCULAR | Status: DC | PRN
  Administered 2020-06-08: .2 mg via INTRAVENOUS

## 2020-06-08 MED ORDER — SODIUM CHLORIDE 0.9 % IV SOLN: INTRAVENOUS | Status: DC

## 2020-06-08 MED ORDER — LIDOCAINE HCL (CARDIAC) PF 100 MG/5ML IV SOSY
PREFILLED_SYRINGE | INTRAVENOUS | Status: DC | PRN
  Administered 2020-06-08: 100 mg via INTRAVENOUS

## 2020-06-08 MED ORDER — PROPOFOL 10 MG/ML IV BOLUS
INTRAVENOUS | Status: DC | PRN
  Administered 2020-06-08: 10 mg via INTRAVENOUS
  Administered 2020-06-08: 20 mg via INTRAVENOUS
  Administered 2020-06-08: 50 mg via INTRAVENOUS

## 2020-06-08 MED ORDER — PROPOFOL 500 MG/50ML IV EMUL
INTRAVENOUS | Status: DC | PRN
  Administered 2020-06-08: 140 ug/kg/min via INTRAVENOUS

## 2020-06-08 MED ORDER — DEXMEDETOMIDINE (PRECEDEX) IN NS 20 MCG/5ML (4 MCG/ML) IV SYRINGE
PREFILLED_SYRINGE | INTRAVENOUS | Status: AC
  Filled 2020-06-08: qty 5

## 2020-06-08 NOTE — Transfer of Care (Signed)
Immediate Anesthesia Transfer of Care Note  Patient: Helen Mccoy  Procedure(s) Performed: COLONOSCOPY WITH PROPOFOL (N/A )  Patient Location: PACU and Endoscopy Unit  Anesthesia Type:General  Level of Consciousness: drowsy  Airway & Oxygen Therapy: Patient Spontanous Breathing  Post-op Assessment: Report given to RN and Post -op Vital signs reviewed and stable  Post vital signs: Reviewed and stable  Last Vitals:  Vitals Value Taken Time  BP 98/61 06/08/20 1008  Temp 36.4 C 06/08/20 1008  Pulse 83 06/08/20 1008  Resp 13 06/08/20 1008  SpO2 97 % 06/08/20 1008    Last Pain:  Vitals:   06/08/20 0800  TempSrc: Temporal  PainSc: 0-No pain         Complications: No complications documented.

## 2020-06-08 NOTE — Anesthesia Preprocedure Evaluation (Signed)
Anesthesia Evaluation  Patient identified by MRN, date of birth, ID band Patient awake    Reviewed: Allergy & Precautions, H&P , NPO status , Patient's Chart, lab work & pertinent test results, reviewed documented beta blocker date and time   History of Anesthesia Complications Negative for: history of anesthetic complications  Airway Mallampati: III  TM Distance: >3 FB Neck ROM: full    Dental  (+) Dental Advidsory Given   Pulmonary neg pulmonary ROS, former smoker,    Pulmonary exam normal breath sounds clear to auscultation       Cardiovascular Exercise Tolerance: Good negative cardio ROS Normal cardiovascular exam Rhythm:regular Rate:Normal     Neuro/Psych negative neurological ROS  negative psych ROS   GI/Hepatic negative GI ROS, Neg liver ROS,   Endo/Other  negative endocrine ROS  Renal/GU negative Renal ROS  negative genitourinary   Musculoskeletal   Abdominal   Peds  Hematology negative hematology ROS (+)   Anesthesia Other Findings Past Medical History: No date: Blind     Comment:  retinitis pigmentosia started at 65 years of age 75 /24/1957: Deafness     Comment:  bilateral   Reproductive/Obstetrics negative OB ROS                             Anesthesia Physical Anesthesia Plan  ASA: I  Anesthesia Plan: General   Post-op Pain Management:    Induction: Intravenous  PONV Risk Score and Plan: 3 and TIVA and Propofol infusion  Airway Management Planned: Natural Airway and Nasal Cannula  Additional Equipment:   Intra-op Plan:   Post-operative Plan:   Informed Consent: I have reviewed the patients History and Physical, chart, labs and discussed the procedure including the risks, benefits and alternatives for the proposed anesthesia with the patient or authorized representative who has indicated his/her understanding and acceptance.     Dental Advisory  Given  Plan Discussed with: Anesthesiologist, CRNA and Surgeon  Anesthesia Plan Comments:         Anesthesia Quick Evaluation

## 2020-06-08 NOTE — H&P (Signed)
Vonda Antigua, MD [address] [place], [place], [place], [address] [address], [place], [place], [place], [address] Phone: [phone]  Fax: [phone]  Primary Care Physician:  Crecencio Mc, MD   Pre-Procedure History & Physical: HPI:  Helen Mccoy is a 65 y.o. female is here for a colonoscopy.   Past Medical History:  Diagnosis Date  . Blind    retinitis pigmentosia started at 65 years of age  . Deafness 9 /24/1957   bilateral    Past Surgical History:  Procedure Laterality Date  . ABDOMINAL HYSTERECTOMY  1986   nonmalignant reasons  . CESAREAN SECTION    . COSMETIC SURGERY    . EYE SURGERY     Cataract Surgery  . TONSILECTOMY, ADENOIDECTOMY, BILATERAL MYRINGOTOMY AND TUBES Bilateral 1968  . TONSILLECTOMY    . TUBAL LIGATION      Prior to Admission medications   Medication Sig Start Date End Date Taking? Authorizing Provider  triamcinolone ointment (KENALOG) 0.1 % APP EXT AA BID PRN 03/30/19  Yes [provider]  Calcium Carbonate-Vitamin D 500-125 MG-UNIT TABS Take 1 tablet by mouth daily.    [provider]  hydrocortisone 2.5 % ointment  03/30/19   [provider]    Allergies as of 05/19/2020 - Review Complete 05/19/2020  Allergen Reaction Noted  . Sulfa antibiotics Nausea And Vomiting 02/01/2013    Family History  Problem Relation Age of Onset  . Arthritis Mother   . Cancer Mother   . Breast cancer Mother 61  . Hypertension Father   . Stroke Father   . Macular degeneration Brother     Social History   Socioeconomic History  . Marital status: Divorced    Spouse name: Not on file  . Number of children: Not on file  . Years of education: Not on file  . Highest education level: Not on file  Occupational History  . Not on file  Tobacco Use  . Smoking status: Former Smoker    Packs/day: 1.00    Types: Cigarettes    Quit date: 02/02/1999    Years since quitting: 21.3  . Smokeless tobacco: Never  Used  Substance and Sexual Activity  . Alcohol use: Yes    Comment: occasionally  . Drug use: No  . Sexual activity: Not Currently  Other Topics Concern  . Not on file  Social History Narrative  . Not on file   Social Determinants of Health   Financial Resource Strain: Not on file  Food Insecurity: Not on file  Transportation Needs: Not on file  Physical Activity: Not on file  Stress: Not on file  Social Connections: Not on file  Intimate Partner Violence: Not on file    Review of Systems: See HPI, otherwise negative ROS  Physical Exam: BP (!) 145/77   Pulse 88   Temp 97.6 F (36.4 C) (Temporal)   Resp 18   Ht 5\' 3"  (1.6 m)   Wt 83.9 kg   SpO2 97%   BMI 32.77 kg/m  General:   Alert,  pleasant and cooperative in NAD Head:  Normocephalic and atraumatic. Neck:  Supple; no masses or thyromegaly. Lungs:  Clear throughout to auscultation, normal respiratory effort.    Heart:  +S1, +S2, Regular rate and rhythm, No edema. Abdomen:  Soft, nontender and nondistended. Normal bowel sounds, without guarding, and without rebound.   Neurologic:  Alert and  oriented x4;  grossly normal neurologically.  Impression/Plan: Helen Mccoy is here  for a colonoscopy to be performed for average risk screening.  Risks, benefits, limitations, and alternatives regarding  colonoscopy have been reviewed with the patient.  Questions have been answered.  All parties agreeable.   Virgel Manifold, MD  06/08/2020, 9:01 AM

## 2020-06-08 NOTE — Anesthesia Postprocedure Evaluation (Signed)
Anesthesia Post Note  Patient: Helen Mccoy  Procedure(s) Performed: COLONOSCOPY WITH PROPOFOL (N/A )  Patient location during evaluation: Endoscopy Anesthesia Type: General Level of consciousness: awake and alert Pain management: pain level controlled Vital Signs Assessment: post-procedure vital signs reviewed and stable Respiratory status: spontaneous breathing, nonlabored ventilation and respiratory function stable Cardiovascular status: blood pressure returned to baseline and stable Postop Assessment: no apparent nausea or vomiting Anesthetic complications: no   No complications documented.   Last Vitals:  Vitals:   06/08/20 1028 06/08/20 1038  BP: 110/62 (!) 91/53  Pulse: 81 80  Resp: 14 17  Temp:    SpO2: 96% 100%    Last Pain:  Vitals:   06/08/20 1028  TempSrc:   PainSc: Asleep                 Alphonsus Sias

## 2020-06-08 NOTE — Op Note (Addendum)
[facility] Patient Name: Helen Mccoy Procedure Date: 06/08/2020 9:09 AM MRN: [id number] Account #: [id number] Date of Birth: 12/08/1955 Admit Type: Outpatient Age: 65 Room: [facility] ENDO ROOM 2 Gender: Female Note Status: Finalized Procedure:             Colonoscopy Indications:           Screening for colorectal malignant neoplasm,                         Colonoscopy in 2002 did not have any polyps. 2011                         colonoscopy report unavailable and pt does not know                         results. Providers:             Lennette Bihari. Bonna Gains MD, MD Referring MD:          Deborra Medina, MD (Referring MD) Medicines:             Monitored Anesthesia Care Complications:         No immediate complications. Procedure:             Pre-Anesthesia Assessment:                        - ASA Grade Assessment: II - A patient with mild                         systemic disease.                        - Prior to the procedure, a History and Physical was                         performed, and patient medications, allergies and                         sensitivities were reviewed. The patient's tolerance                         of previous anesthesia was reviewed.                        - The risks and benefits of the procedure and the                         sedation options and risks were discussed with the                         patient. All questions were answered and informed                         consent was obtained.                        - Patient identification and proposed procedure were                         verified prior to the procedure by the  physician, the                         nurse, the anesthesiologist, the anesthetist and the                         technician. The procedure was verified in the                         procedure room.                        After obtaining informed consent, the colonoscope was                          passed under direct vision. Throughout the procedure,                         the patient's blood pressure, pulse, and oxygen                         saturations were monitored continuously. The                         Colonoscope was introduced through the anus and                         advanced to the the cecum, identified by appendiceal                         orifice and ileocecal valve. The colonoscopy was                         performed with ease. The patient tolerated the                         procedure well. The quality of the bowel preparation                         was good. Findings:      The perianal and digital rectal examinations were normal.      A 3 mm polyp was found in the cecum. The polyp was sessile. The polyp       was removed with a jumbo cold forceps. Resection and retrieval were       complete.      A 10 mm polyp was found in the ascending colon. The polyp was flat. The       polyp was removed with a piecemeal technique using a cold snare.       Resection and retrieval were complete.      A localized area of mildly thickened folds of the mucosa was found in       the ascending colon. Biopsies were taken with a cold forceps for       histology. This was adjacent to the ileocecal valve as demonstrated in       the image.      A 15 mm, non-bleeding polyp was found in the hepatic flexure. The polyp       was flat. Preparations were made for mucosal resection. NBI was done to  mark the borders of the lesion. 4 mL of Eleview was injected with       adequate lift of the lesion from the muscularis propria. Snare mucosal       resection with suction (via the working channel) retrieval was       performed. A 15 mm area was resected. Resection and retrieval were       complete. There was no bleeding at the end of the procedure. To prevent       bleeding after the polypectomy, one hemostatic clip was successfully       placed. There was no bleeding at the  end of the procedure.      The exam was otherwise without abnormality.      The rectum, sigmoid colon, descending colon, transverse colon, ascending       colon and cecum appeared normal.      The retroflexed view of the distal rectum and anal verge was normal and       showed no anal or rectal abnormalities. Impression:            - One 3 mm polyp in the cecum, removed with a jumbo                         cold forceps. Resected and retrieved.                        - One 10 mm polyp in the ascending colon, removed                         piecemeal using a cold snare. Resected and retrieved.                        - Thickened folds of the mucosa in the ascending                         colon. Biopsied.                        - One 15 mm polyp at the hepatic flexure, removed                         using lift and cut and a hot snare. Resected and                         retrieved. Clip was placed. Injected.                        - The examination was otherwise normal.                        - The rectum, sigmoid colon, descending colon,                         transverse colon, ascending colon and cecum are normal.                        - The distal rectum and anal verge are normal on  retroflexion view. Recommendation:        - Await pathology results.                        - Discharge patient to home (with escort).                        - Advance diet as tolerated.                        - Continue present medications.                        - Repeat colonoscopy date to be determined after                         pending pathology results are reviewed.                        - The findings and recommendations were discussed with                         the patient.                        - The findings and recommendations were discussed with                         the patient's family.                        - Return to primary care physician as previously                          scheduled. Procedure Code(s):     --- Professional ---                        [phone], Colonoscopy, flexible; with endoscopic mucosal                         resection                        45385, 74, Colonoscopy, flexible; with removal of                         tumor(s), polyp(s), or other lesion(s) by snare                         technique                        45380, 42, Colonoscopy, flexible; with biopsy, single                         or multiple Diagnosis Code(s):     --- Professional ---                        Z12.11, Encounter for screening for malignant neoplasm                         of  colon                        K63.5, Polyp of colon CPT copyright 2019 [employer] Association. All rights reserved. The codes documented in this report are preliminary and upon coder review may  be revised to meet current compliance requirements.  Vonda Antigua, MD Margretta Sidle B. Bonna Gains MD, MD 06/08/2020 10:17:37 AM This report has been signed electronically. Number of Addenda: 0 Note Initiated On: 06/08/2020 9:09 AM Scope Withdrawal Time: 0 hours 40 minutes 43 seconds  Total Procedure Duration: 0 hours 46 minutes 27 seconds  Estimated Blood Loss:  Estimated blood loss: none.      [facility]

## 2020-06-09 ENCOUNTER — Encounter: Payer: Self-pay | Admitting: Gastroenterology

## 2020-06-09 LAB — SURGICAL PATHOLOGY

## 2020-08-04 ENCOUNTER — Other Ambulatory Visit: Payer: Self-pay

## 2020-08-04 ENCOUNTER — Ambulatory Visit
Admission: RE | Admit: 2020-08-04 | Discharge: 2020-08-04 | Disposition: A | Discharge status: Home / Self Care | Payer: PPO | Source: Ambulatory Visit | Attending: Internal Medicine | Admitting: Internal Medicine

## 2020-08-04 DIAGNOSIS — Z1231 Encounter for screening mammogram for malignant neoplasm of breast: Secondary | ICD-10-CM | POA: Insufficient documentation

## 2020-08-17 DIAGNOSIS — H1045 Other chronic allergic conjunctivitis: Secondary | ICD-10-CM | POA: Diagnosis not present

## 2020-08-17 DIAGNOSIS — R21 Rash and other nonspecific skin eruption: Secondary | ICD-10-CM | POA: Diagnosis not present

## 2020-08-17 DIAGNOSIS — J3089 Other allergic rhinitis: Secondary | ICD-10-CM | POA: Diagnosis not present

## 2020-08-17 DIAGNOSIS — J301 Allergic rhinitis due to pollen: Secondary | ICD-10-CM | POA: Diagnosis not present

## 2020-10-18 DIAGNOSIS — L239 Allergic contact dermatitis, unspecified cause: Secondary | ICD-10-CM | POA: Diagnosis not present

## 2020-10-20 DIAGNOSIS — L239 Allergic contact dermatitis, unspecified cause: Secondary | ICD-10-CM | POA: Diagnosis not present

## 2020-10-23 DIAGNOSIS — L239 Allergic contact dermatitis, unspecified cause: Secondary | ICD-10-CM | POA: Diagnosis not present

## 2021-05-03 ENCOUNTER — Encounter: Payer: Self-pay | Admitting: Internal Medicine

## 2021-05-10 ENCOUNTER — Telehealth: Payer: Self-pay | Admitting: Internal Medicine

## 2021-05-10 DIAGNOSIS — R7301 Impaired fasting glucose: Secondary | ICD-10-CM

## 2021-05-10 DIAGNOSIS — R5383 Other fatigue: Secondary | ICD-10-CM

## 2021-05-10 DIAGNOSIS — E782 Mixed hyperlipidemia: Secondary | ICD-10-CM

## 2021-05-10 NOTE — Telephone Encounter (Signed)
Requesting physical labs to be done prior to physical. Lab appointment has been scheduled for 05/29/21.

## 2021-05-11 ENCOUNTER — Encounter: Payer: PPO | Admitting: Internal Medicine

## 2021-05-11 NOTE — Telephone Encounter (Signed)
Pt would like to have labs done before physical. I have ordered Lipid panel, A1c, CMP, TSH and CBC. Is there anything else that you would like to order?

## 2021-05-22 ENCOUNTER — Other Ambulatory Visit: Payer: Self-pay

## 2021-05-22 DIAGNOSIS — Z86010 Personal history of colonic polyps: Secondary | ICD-10-CM

## 2021-05-22 MED ORDER — NA SULFATE-K SULFATE-MG SULF 17.5-3.13-1.6 GM/177ML PO SOLN: 1.0000 | Freq: Once | ORAL | 0 refills | Status: AC

## 2021-05-29 ENCOUNTER — Other Ambulatory Visit: Payer: Self-pay

## 2021-05-29 ENCOUNTER — Other Ambulatory Visit (INDEPENDENT_AMBULATORY_CARE_PROVIDER_SITE_OTHER): Payer: PPO

## 2021-05-29 DIAGNOSIS — E782 Mixed hyperlipidemia: Secondary | ICD-10-CM

## 2021-05-29 DIAGNOSIS — R7301 Impaired fasting glucose: Secondary | ICD-10-CM

## 2021-05-29 DIAGNOSIS — R5383 Other fatigue: Secondary | ICD-10-CM | POA: Diagnosis not present

## 2021-05-29 LAB — CBC WITH DIFFERENTIAL/PLATELET
Basophils Absolute: 0 10*3/uL (ref 0.0–0.1)
Basophils Relative: 0.3 % (ref 0.0–3.0)
Eosinophils Absolute: 0.1 10*3/uL (ref 0.0–0.7)
Eosinophils Relative: 1.9 % (ref 0.0–5.0)
HCT: 39.9 % (ref 36.0–46.0)
Hemoglobin: 12.8 g/dL (ref 12.0–15.0)
Lymphocytes Relative: 40.1 % (ref 12.0–46.0)
Lymphs Abs: 2.2 10*3/uL (ref 0.7–4.0)
MCHC: 32 g/dL (ref 30.0–36.0)
MCV: 92.4 fl (ref 78.0–100.0)
Monocytes Absolute: 0.6 10*3/uL (ref 0.1–1.0)
Monocytes Relative: 10.3 % (ref 3.0–12.0)
Neutro Abs: 2.5 10*3/uL (ref 1.4–7.7)
Neutrophils Relative %: 47.4 % (ref 43.0–77.0)
Platelets: 304 10*3/uL (ref 150.0–400.0)
RBC: 4.32 Mil/uL (ref 3.87–5.11)
RDW: 12.6 % (ref 11.5–15.5)
WBC: 5.4 10*3/uL (ref 4.0–10.5)

## 2021-05-29 LAB — COMPREHENSIVE METABOLIC PANEL
ALT: 20 U/L (ref 0–35)
AST: 28 U/L (ref 0–37)
Albumin: 4.2 g/dL (ref 3.5–5.2)
Alkaline Phosphatase: 87 U/L (ref 39–117)
BUN: 15 mg/dL (ref 6–23)
CO2: 31 mEq/L (ref 19–32)
Calcium: 9.4 mg/dL (ref 8.4–10.5)
Chloride: 103 mEq/L (ref 96–112)
Creatinine, Ser: 0.8 mg/dL (ref 0.40–1.20)
GFR: 76.83 mL/min (ref >60.00)
Glucose, Bld: 85 mg/dL (ref 70–99)
Potassium: 4.2 mEq/L (ref 3.5–5.1)
Sodium: 141 mEq/L (ref 135–145)
Total Bilirubin: 0.5 mg/dL (ref 0.2–1.2)
Total Protein: 7.4 g/dL (ref 6.0–8.3)

## 2021-05-29 LAB — LIPID PANEL
Cholesterol: 207 mg/dL — ABNORMAL HIGH (ref 0–200)
HDL: 65.3 mg/dL (ref >39.00)
LDL Cholesterol: 120 mg/dL — ABNORMAL HIGH (ref 0–99)
NonHDL: 141.84
Total CHOL/HDL Ratio: 3
Triglycerides: 107 mg/dL (ref 0.0–149.0)
VLDL: 21.4 mg/dL (ref 0.0–40.0)

## 2021-05-29 LAB — TSH: TSH: 2.23 u[IU]/mL (ref 0.35–5.50)

## 2021-05-29 LAB — HEMOGLOBIN A1C: Hgb A1c MFr Bld: 5.6 % (ref 4.6–6.5)

## 2021-06-01 ENCOUNTER — Ambulatory Visit (INDEPENDENT_AMBULATORY_CARE_PROVIDER_SITE_OTHER): Payer: PPO | Admitting: Internal Medicine

## 2021-06-01 ENCOUNTER — Telehealth: Payer: Self-pay | Admitting: Internal Medicine

## 2021-06-01 ENCOUNTER — Encounter: Payer: Self-pay | Admitting: Internal Medicine

## 2021-06-01 ENCOUNTER — Other Ambulatory Visit: Payer: Self-pay

## 2021-06-01 VITALS — BP 124/66 | HR 78 | Temp 98.2°F | Ht 63.0 in | Wt 187.0 lb

## 2021-06-01 DIAGNOSIS — G8929 Other chronic pain: Secondary | ICD-10-CM

## 2021-06-01 DIAGNOSIS — Z1231 Encounter for screening mammogram for malignant neoplasm of breast: Secondary | ICD-10-CM

## 2021-06-01 DIAGNOSIS — M25512 Pain in left shoulder: Secondary | ICD-10-CM

## 2021-06-01 DIAGNOSIS — M79644 Pain in right finger(s): Secondary | ICD-10-CM

## 2021-06-01 DIAGNOSIS — L298 Other pruritus: Secondary | ICD-10-CM | POA: Diagnosis not present

## 2021-06-01 DIAGNOSIS — M1811 Unilateral primary osteoarthritis of first carpometacarpal joint, right hand: Secondary | ICD-10-CM

## 2021-06-01 DIAGNOSIS — Z780 Asymptomatic menopausal state: Secondary | ICD-10-CM

## 2021-06-01 DIAGNOSIS — M25511 Pain in right shoulder: Secondary | ICD-10-CM

## 2021-06-01 DIAGNOSIS — E663 Overweight: Secondary | ICD-10-CM

## 2021-06-01 MED ORDER — DICLOFENAC SODIUM 1 % EX GEL: 2.0000 g | Freq: Four times a day (QID) | CUTANEOUS | 5 refills | Status: DC

## 2021-06-01 MED ORDER — CLOBETASOL PROPIONATE 0.05 % EX SOLN: 1.0000 "application " | Freq: Two times a day (BID) | CUTANEOUS | 0 refills | Status: DC

## 2021-06-01 NOTE — Assessment & Plan Note (Signed)
Her exam is consistent with arthritis and bone spurring as she has pain with passive ROM that starts at the shoulder joint.  orthopedics and PT referrals

## 2021-06-01 NOTE — Assessment & Plan Note (Signed)
Recommending trial of diclofenac gel.

## 2021-06-01 NOTE — Patient Instructions (Addendum)
1) You have to have a repeat colonoscopy  this year because you had 2 very large precancerous polyps removed last year.    2) The Tdap (tetanus-diphtheria-whooping cough vaccine ) is due in 2024   and Shingrix vaccines are now  COVERED BY MEDICARE if you get them at your pharmacy    You can expect 24 hours of flu like symptoms after receiving the shingles vaccine, so plan accordingly.  You should repeat the shingrix vaccine because you do did not receive the second dose at the proper interval  3) You received your FINAL pneumonia vaccine      4) add allegra  up to twice daily for itching .  Available OTC   5)  clobetasol scalp solution :  this is a strong steroid solution that can be used twice daily on scalp until itching resolves .  It looks like you have psoriasis   6) You have gained 22 lbs since 2018!  I want you to lose 10 lbs this year through diet and exercise   Your goal is 30 minutes of exercise 5 days per week  7) Orthopedics and PT referral for shoulder pain are in progress. You appear to have a lot of arthritis in the shoulder joints which may improved with a steroid injection

## 2021-06-01 NOTE — Progress Notes (Signed)
Patient ID: Helen Mccoy, female    DOB: [birth year]  Age: 66 y.o. MRN: [id number]  The patient is here for follow up and management of other chronic and acute problems.   The risk factors are reflected in the social history.  The roster of all physicians providing medical care to patient - is listed in the Snapshot section of the chart.  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  Her husband assists with  dressing and meal preparation,  .  She is independent  for mobility and toileting   Home safety : The patient has smoke detectors in the home. They wear seatbelts.  There are no firearms at home. There is no violence in the home.   There is no risks for hepatitis, STDs or HIV. There is no   history of blood transfusion. They have no travel history to infectious disease endemic areas of the world.  The patient has seen their dentist in the last six month. She is  deaf and blind since birth . They do not  have excessive sun exposure. Discussed the need for sun protection: hats, long sleeves and use of sunscreen if there is significant sun exposure.   Diet: the importance of a healthy diet is discussed. She does admit to eating cookies,  potato chips and other junk foods on a regular basis   The benefits of regular aerobic exercise were discussed. She walks once a week for about  20 minutes.   Depression screen: there are no signs or vegative symptoms of depression- irritability, change in appetite, anhedonia, sadness/tearfullness.   The following portions of the patient's history were reviewed and updated as appropriate: allergies, current medications, past family history, past medical history,  past surgical history, past social history  and problem list.  Visual acuity was not assessed per patient preference since she has regular follow up with her ophthalmologist. Hearing and body mass index were assessed and reviewed.    During the course of the visit the patient was educated and counseled about appropriate screening and preventive services including : fall prevention , diabetes screening, nutrition counseling, colorectal cancer screening, and recommended immunizations.    CC: The primary encounter diagnosis was Postmenopausal estrogen deficiency. Diagnoses of Encounter for screening mammogram for malignant neoplasm of breast, Thumb pain, right, Chronic pain of both shoulders, Overweight (BMI 25.0-29.9), Pruritic erythematous rash, and Arthritis of carpometacarpal (CMC) joint of right thumb were also pertinent to this visit.  Reviewed last year's colonoscopy report at her request; explained why a repeat is due this year.   Does not want more COVID vaccines due to reactions to prior boosters.   Eyes water a lot , especially with yawning;  inner corner of eyes constantly itching and irritated . Wakes up with matted eyelids in the middle of the night most nights ,  for the past 2-3  years.   Using an eye drop for allergies prescribed by Dasher . Not sure what,  did not keep follow up APPOINTMENT with him  but has persistent redness and bumps on face,  and now has a silvery placque at the nape of her neck  in the scalp  line.  .  Uses the ointment he gives her  called Vanicream on face and on scalp as a shampoo resolution,    Both arms still hurt in the upper regions in the deltoid and triceps area,  aggravated by raising  arms ,  exam has pain starting in the shoulder with passive abduction of arm.      History Helen Mccoy has a past medical history of Blind and Deafness (9 /24/1957).   She has a past surgical history that includes Tonsilectomy, adenoidectomy, bilateral myringotomy and tubes (Bilateral, 1968); Abdominal hysterectomy (1986); Cesarean section; Cosmetic surgery; Tubal ligation; Tonsillectomy; Eye surgery; and Colonoscopy with propofol (N/A, 06/08/2020).   Her family history includes Arthritis in her  mother; Breast cancer (age of onset: 72) in her mother; Cancer in her mother; Hypertension in her father; Macular degeneration in her brother; Stroke in her father.She reports that she quit smoking about 22 years ago. Her smoking use included cigarettes. She smoked an average of 1 pack per day. She has never used smokeless tobacco. She reports current alcohol use. She reports that she does not use drugs.  Outpatient Medications Prior to Visit  Medication Sig Dispense Refill   Calcium Carbonate-Vitamin D 500-125 MG-UNIT TABS Take 1 tablet by mouth daily.     hydrocortisone 2.5 % ointment      triamcinolone ointment (KENALOG) 0.1 % APP EXT AA BID PRN     No facility-administered medications prior to visit.    Review of Systems  Patient denies headache, fevers, malaise, unintentional weight loss, , eye pain, sinus congestion and sinus pain, sore throat, dysphagia,  hemoptysis , cough, dyspnea, wheezing, chest pain, palpitations, orthopnea, edema, abdominal pain, nausea, melena, diarrhea, constipation, flank pain, dysuria, hematuria, urinary  Frequency, nocturia, numbness, tingling, seizures,  Focal weakness, Loss of consciousness,  Tremor, insomnia, depression, anxiety, and suicidal ideation.     Objective:  BP 124/66 (BP Location: Left Arm, Patient Position: Sitting, Cuff Size: Large)    Pulse 78    Temp 98.2 F (36.8 C) (Oral)    Ht 5\' 3"  (1.6 m)    Wt 187 lb (84.8 kg)    SpO2 99%    BMI 33.13 kg/m   Physical Exam   General appearance: alert, cooperative and appears stated age Scalp:  nickel sized silvery placque at base of scalp Face: scaling erythematous rash at inner corners of both eyes.  No conjunctival injection Cheeks,  chin with erythematous nonscaling papular rash  Ears: normal TM's and external ear canals both ears Throat: lips, mucosa, and tongue normal; teeth and gums normal Neck: no adenopathy, no carotid bruit, supple, symmetrical, trachea midline and thyroid not enlarged,  symmetric, no tenderness/mass/nodules Back: symmetric, no curvature. ROM normal. No CVA tenderness. Lungs: clear to auscultation bilaterally Heart: regular rate and rhythm, S1, S2 normal, no murmur, click, rub or gallop Abdomen: soft, non-tender; bowel sounds normal; no masses,  no organomegaly Pulses: 2+ and symmetric bilaterally  Ext:  pain in shoulder  that radiates to upper armwith passive flexion and abduction  bilaterally  Skin: Skin color, texture, turgor normal. No rashes or lesions Lymph nodes: Cervical, supraclavicular, and axillary nodes normal.   Assessment & Plan:   Problem List Items Addressed This Visit     Overweight (BMI 25.0-29.9)    I have addressed  BMI and recommended wt loss of 10% of body weigh over the next 6 months using a low glycemic index diet and regular exercise a minimum of 5 days per week.        Pruritic erythematous rash    Etiology unclear.  She has had testing and treatment by Dr Evorn Gong with no resolution.  Records not available.  Per patient using Vanicream on face and scalp .  Recommend  adding allegra as a  Trial given eye involvement .  Records requesting. Prescribing clobetasol scalp solution       Chronic pain of both shoulders    Her exam is consistent with arthritis and bone spurring as she has pain with passive ROM that starts at the shoulder joint.  orthopedics and PT referrals       Relevant Orders   Ambulatory referral to Physical Therapy   Ambulatory referral to Orthopedic Surgery   Arthritis of carpometacarpal [facility]) joint of right thumb    Recommending trial of diclofenac gel.       Other Visit Diagnoses     Postmenopausal estrogen deficiency    -  Primary   Relevant Orders   DG Bone Density   Encounter for screening mammogram for malignant neoplasm of breast       Relevant Orders   MM 3D SCREEN BREAST BILATERAL   Thumb pain, right       Relevant Medications   diclofenac Sodium (VOLTAREN) 1 % GEL   Other Relevant Orders    Ambulatory referral to Physical Therapy      A total of 70 minutes of face to face time was spent with patient more than half of which was spent in counselling and coordination of care    Medications Discontinued During This Encounter  Medication Reason   hydrocortisone 2.5 % ointment    triamcinolone ointment (KENALOG) 0.1 %     Follow-up: No follow-ups on file.   Crecencio Mc, MD

## 2021-06-01 NOTE — Assessment & Plan Note (Signed)
Etiology unclear.  She has had testing and treatment by Dr Evorn Gong with no resolution.  Records not available.  Per patient using Vanicream on face and scalp .  Recommend adding allegra as a  Trial given eye involvement .  Records requesting. Prescribing clobetasol scalp solution

## 2021-06-01 NOTE — Telephone Encounter (Signed)
Pt mom requesting for lab order for Pt physical that is schedule for 06/03/2022. No lab orders was in system. Pt requesting callback to schedule appt.

## 2021-06-01 NOTE — Assessment & Plan Note (Signed)
I have addressed  BMI and recommended wt loss of 10% of body weigh over the next 6 months using a low glycemic index diet and regular exercise a minimum of 5 days per week.   

## 2021-06-06 ENCOUNTER — Other Ambulatory Visit: Payer: Self-pay | Admitting: Internal Medicine

## 2021-06-06 MED ORDER — AZELASTINE HCL 0.05 % OP SOLN: 1.0000 [drp] | Freq: Two times a day (BID) | OPHTHALMIC | 12 refills | Status: DC

## 2021-06-11 ENCOUNTER — Ambulatory Visit: Payer: PPO | Admitting: Physical Therapy

## 2021-06-13 DIAGNOSIS — M7542 Impingement syndrome of left shoulder: Secondary | ICD-10-CM | POA: Diagnosis not present

## 2021-06-13 DIAGNOSIS — M7541 Impingement syndrome of right shoulder: Secondary | ICD-10-CM | POA: Diagnosis not present

## 2021-06-13 DIAGNOSIS — M1811 Unilateral primary osteoarthritis of first carpometacarpal joint, right hand: Secondary | ICD-10-CM | POA: Diagnosis not present

## 2021-06-14 ENCOUNTER — Other Ambulatory Visit: Payer: Self-pay

## 2021-06-14 ENCOUNTER — Ambulatory Visit: Payer: PPO | Attending: Internal Medicine | Admitting: Physical Therapy

## 2021-06-14 ENCOUNTER — Ambulatory Visit: Payer: PPO | Admitting: Physical Therapy

## 2021-06-14 DIAGNOSIS — M79644 Pain in right finger(s): Secondary | ICD-10-CM | POA: Diagnosis not present

## 2021-06-14 DIAGNOSIS — M25512 Pain in left shoulder: Secondary | ICD-10-CM | POA: Insufficient documentation

## 2021-06-14 DIAGNOSIS — R252 Cramp and spasm: Secondary | ICD-10-CM | POA: Insufficient documentation

## 2021-06-14 DIAGNOSIS — G8929 Other chronic pain: Secondary | ICD-10-CM | POA: Insufficient documentation

## 2021-06-14 DIAGNOSIS — M25511 Pain in right shoulder: Secondary | ICD-10-CM | POA: Diagnosis not present

## 2021-06-14 NOTE — Therapy (Signed)
[place] [facility] [address] [place], [address] Phone: [phone]   Fax:  [phone]  Physical Therapy Evaluation  Patient Details  Name: Helen Mccoy MRN: [id number] Date of Birth: [birth year] No data recorded  Encounter Date: 06/14/2021   PT End of Session - 06/14/21 1233     Visit Number 1    Number of Visits 17    Authorization - Visit Number 1    Authorization - Number of Visits 17    PT Start Time 1030    PT Stop Time 1120    PT Time Calculation (min) 50 min    Activity Tolerance Patient tolerated treatment well;Patient limited by fatigue;Patient limited by pain    Behavior During Therapy [facility] for tasks assessed/performed             Past Medical History:  Diagnosis Date   Blind    retinitis pigmentosia started at 66 years of age   Deafness 51 /24/1956   bilateral    Past Surgical History:  Procedure Laterality Date   ABDOMINAL HYSTERECTOMY  1986   nonmalignant reasons   CESAREAN SECTION     COLONOSCOPY WITH PROPOFOL N/A 06/08/2020   Procedure: COLONOSCOPY WITH PROPOFOL;  Surgeon: Virgel Manifold, MD;  Location: [facility] ENDOSCOPY;  Service: Endoscopy;  Laterality: N/A;  TACTIILE SIGN LANGUAGE   COSMETIC SURGERY     EYE SURGERY     Cataract Surgery   TONSILECTOMY, ADENOIDECTOMY, BILATERAL MYRINGOTOMY AND TUBES Bilateral 1968   TONSILLECTOMY     TUBAL LIGATION      There were no vitals filed for this visit.    Subjective Assessment - 06/14/21 1033     Subjective 66 y.o. pnt presents with bilateral chronic shoulder pain    Patient is accompained by: Interpreter   Deaf and Blind   Pertinent History 66 y.o. female pnt presenst with chronic bilateral shoulder pain with insidious onset that started last fall. pnt reports pain is at worst 8/10, best 0/10, and currently 4/10 on NPS. pnt describes pain as achy and dull. aggrevating factors include pushing/pulling, sleeping, bed mobility,  opening a jar, squeezing shampoo/conditioner bottles, overheard activites, washing her hair, reaching behind her, picking things up, taking her bra off, and cold weather. pain is worse in the morning. easing factors include steroid injections and rest. pnt goal is strengthing and exercise. pnt denys weight change, b/b dysfunction, n/v, fever, night pain, or falls within the last 6 months    Limitations Lifting;House hold activities    How long can you sit comfortably? unlimited    How long can you stand comfortably? unlimited    How long can you walk comfortably? unlimited    Patient Stated Goals to increase her strength and learn exercises    Currently in Pain? Yes    Pain Score 4     Pain Location Shoulder    Pain Orientation Right    Pain Descriptors / Indicators Aching    Pain Type Chronic pain    Pain Radiating Towards wrist    Pain Onset More than a month ago    Pain Frequency Intermittent    Aggravating Factors  aggrevating factors include pushing/pulling, sleeping, bed mobility, opening a jar, squeezing shampoo/conditioner bottles, overheard activites, washing her hair, reaching behind her, picking things up, taking her bra off, and cold weather.    Pain Relieving Factors rest, injections    Multiple Pain Sites Yes    Pain Score  4    Pain Location Shoulder    Pain Orientation Left    Pain Descriptors / Indicators Aching    Pain Type Chronic pain    Pain Radiating Towards wrist    Pain Onset More than a month ago    Pain Frequency Intermittent    Aggravating Factors  aggrevating factors include pushing/pulling, sleeping, bed mobility, opening a jar, squeezing shampoo/conditioner bottles, overheard activites, washing her hair, reaching behind her, picking things up, taking her bra off, and cold weather.    Pain Relieving Factors rest, injections               Posture Increased kyphosis Upper crossed syndrome with FHRS  Observation: scapular dyskenwsis with overhead  shoulder motion with UT activation and increased scapular elevation with little downward rotation  Cervical ROM/OP All gross motion WNL Spurlings: negative   Elbow ROM/OP All gross motion WNL   Shoulder AROM/PROM Flexion: R:150. L:140 Abduction: R: 152. L: 105  Extension: R: 50. L: 45 IR L: c3 R: c5 ER:  L: psis, R: psis  AROM=PROM  Shoulder MMT Flexion: R: 3+/5. L: 3+/5 Abduction: R: 3+/5. L: 3+/5 ER: R: 4/5. L: 3/5 IR: R: 5/5. L: 5/5  Parascapular MMT Deferred due to time    Palpation Increased tone and trigger points in bilateral UT *not concordant   Grip Dynamometer  Deferred due to time  Therex PT reviewed the following HEP with patient with patient able to demonstrate a set of the following with min cuing for correction needed. PT educated patient on parameters of therex (how/when to inc/decrease intensity, frequency, rep/set range, stretch hold time, and purpose of therex) with verbalized understanding.  Scapular retractions 3 x 12- pnt education on posture  Thoracic Extension x 12            Objective measurements completed on examination: See above findings.                PT Education - 06/14/21 1232     Education Details dx, prognosis, and HEP    Person(s) Educated Patient    Methods Explanation;Demonstration;Tactile cues    Comprehension Verbalized understanding;Returned demonstration              PT Short Term Goals - 06/14/21 1338       PT SHORT TERM GOAL #1   Title Pnt will be independent with HEP in order to progress rehab at home in order to acheive ADLs.    Time 4    Period Weeks    Status New    Target Date 07/12/21      PT SHORT TERM GOAL #2   Title Pnt will decrease max NPS to 6/10 in order to achieve functional ADLs.    Baseline 06/14/21 8/10    Time 4    Period Weeks    Status New    Target Date 07/12/21               PT Long Term Goals - 06/14/21 1340       PT LONG TERM GOAL #1   Title Pnt  will increase functional ER to T3 and IR to T7 in order to dress and put bra on/off independently.    Baseline 2/9/23IR L: c3 R: c5  ER:  L: psis, R: psis    Time 8    Period Weeks    Status New    Target Date 08/09/21      PT LONG TERM GOAL #2  Title pnt will increase bilateral gross shoulder MMT to 4/5 in order to achieve functional ADLs.    Baseline 06/14/21 Flexion: R: 3+/5. L: 3+/5  Abduction: R: 3+/5. L: 3+/5  ER: R: 4/5. L: 3/5  IR: R: 5/5. L: 5/5.    Time 8    Period Weeks    Status New    Target Date 08/09/21      PT LONG TERM GOAL #3   Title pnt will increase bilateral shoulder AROM flexion/abduction to 170 in order to complete overhead ADLs    Baseline 06/14/21 Flexion: R:150. L:140  Abduction: R: 152. L: 105    Time 8    Period Weeks    Status New    Target Date 08/09/21                    Plan - 06/14/21 1311     Clinical Impression Statement pnt presents with chronic bilateral shoulder pain r/o OA. pnt impairments include decreased shoulder and scapulr mobility,decreased scapulohumeral rhythm, abnormal posture, decreased periscapular, abduction and ER strength, gross ROM, and pain. The listed deficits decrease pnt ability to complete bed mobility, lifting, pushing/pulling, over head mvmt, and functional reaching. These deficits inhibit pnt ability to participate in household ADLs including dressing, washing hair, putting on/off a bra, reaching behind her, picking things up, and squeezing her shampoo/conditioner bottles. pnt tolerated therex therex well with good motivation and demonstrated good verbal understanding as well as demonstration of HEP. pnt requires skilled physical therapy to address listed deficits to increase strength, improve ROM, and improve pain in order to comlete functional ADLs.    Personal Factors and Comorbidities Age;Time since onset of injury/illness/exacerbation;Other    Examination-Activity Limitations Bathing;Lift;Reach  Overhead;Carry;Dressing;Bed Mobility;Hygiene/Grooming;Sleep    Examination-Participation Restrictions Cleaning;Laundry;Community Activity;Occupation;Yard Work    [employer] Evolving/Moderate complexity    Clinical Decision Making Moderate    Rehab Potential Good    PT Frequency 2x / week    PT Duration 8 weeks    PT Treatment/Interventions ADLs/Self Care Home Management;Electrical Stimulation;Iontophoresis 4mg /ml Dexamethasone;Biofeedback;Gait training;Stair training;Functional mobility training;Therapeutic activities;Therapeutic exercise;Balance training;Neuromuscular re-education;Patient/family education;Manual techniques;Passive range of motion;Dry needling;Energy conservation;Joint Manipulations;Moist Heat;Ultrasound;Spinal Manipulations    PT Next Visit Plan periscapular MMT, shoulder special tests (impingement), grip strength, progress gentle ROM and shoulder strength program.    PT Home Exercise Plan banded ER , AAROM    Consulted and Agree with Plan of Care Patient             Patient will benefit from skilled therapeutic intervention in order to improve the following deficits and impairments:  Decreased endurance, Decreased mobility, Impaired vision/preception, Decreased range of motion, Improper body mechanics, Pain, Impaired UE functional use, Decreased strength, Decreased activity tolerance, Hypomobility, Increased muscle spasms, Impaired flexibility  Visit Diagnosis: Chronic left shoulder pain  Chronic right shoulder pain     Problem List Patient Active Problem List   Diagnosis Date Noted   Chronic pain of both shoulders 06/01/2021   Arthritis of carpometacarpal [facility]) joint of right thumb 06/01/2021   Encounter for screening colonoscopy    Cecal polyp    Polyp of ascending colon    Polyp of hepatic flexure of colon    Chronic left hip pain 05/09/2019   Venous stasis dermatitis of both lower extremities 11/04/2018   Pruritic erythematous rash  10/24/2018   Hyperlipidemia 05/03/2018   Bilateral hand numbness 05/01/2016   Welcome to Medicare preventive visit 04/30/2015   S/P TAH (total abdominal hysterectomy) 04/28/2015  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, [place], [address] Phone: [phone]   Fax:  [phone]  Name: Helen Mccoy MRN: [id number] Date of Birth: 06-26-1955

## 2021-06-15 ENCOUNTER — Encounter: Payer: Self-pay | Admitting: Physical Therapy

## 2021-06-18 ENCOUNTER — Ambulatory Visit: Payer: PPO | Admitting: Physical Therapy

## 2021-06-19 ENCOUNTER — Telehealth: Payer: Self-pay

## 2021-06-19 NOTE — Telephone Encounter (Signed)
Called and requested sign language interp with endo so they know she is deaf and blind as patient requested endo is aware and preparing for procedure

## 2021-06-20 ENCOUNTER — Encounter: Payer: Self-pay | Admitting: Physical Therapy

## 2021-06-20 ENCOUNTER — Ambulatory Visit: Payer: PPO | Admitting: Physical Therapy

## 2021-06-20 ENCOUNTER — Other Ambulatory Visit: Payer: Self-pay

## 2021-06-20 DIAGNOSIS — M25512 Pain in left shoulder: Secondary | ICD-10-CM | POA: Diagnosis not present

## 2021-06-20 DIAGNOSIS — R252 Cramp and spasm: Secondary | ICD-10-CM

## 2021-06-20 DIAGNOSIS — G8929 Other chronic pain: Secondary | ICD-10-CM

## 2021-06-20 NOTE — Therapy (Signed)
[place] [facility] [address] [place], [address] Phone: [phone]   Fax:  [phone]  Physical Therapy Treatment  Patient Details  Name: Helen Mccoy MRN: [id number] Date of Birth: 1955/11/04 No data recorded  Encounter Date: 06/20/2021   PT End of Session - 06/20/21 1701     Visit Number 2    Number of Visits 17    Date for PT Re-Evaluation 05/10/20    Authorization - Visit Number 2    Authorization - Number of Visits 17    PT Start Time 7062    PT Stop Time 1430    PT Time Calculation (min) 45 min    Activity Tolerance Patient tolerated treatment well;Patient limited by fatigue;Patient limited by pain    Behavior During Therapy [facility] for tasks assessed/performed             Past Medical History:  Diagnosis Date   Blind    retinitis pigmentosia started at 66 years of age   Deafness 65 /24/1956   bilateral    Past Surgical History:  Procedure Laterality Date   ABDOMINAL HYSTERECTOMY  1986   nonmalignant reasons   CESAREAN SECTION     COLONOSCOPY WITH PROPOFOL N/A 06/08/2020   Procedure: COLONOSCOPY WITH PROPOFOL;  Surgeon: Virgel Manifold, MD;  Location: [facility] ENDOSCOPY;  Service: Endoscopy;  Laterality: N/A;  TACTIILE SIGN LANGUAGE   COSMETIC SURGERY     EYE SURGERY     Cataract Surgery   TONSILECTOMY, ADENOIDECTOMY, BILATERAL MYRINGOTOMY AND TUBES Bilateral 1968   TONSILLECTOMY     TUBAL LIGATION      There were no vitals filed for this visit.   Subjective Assessment - 06/20/21 1345     Subjective pnt reports feeling good after her bilateral shoulder injections and being compliant with HEP.    Patient is accompained by: Interpreter    Pertinent History 66 y.o. female pnt presenst with chronic bilateral shoulder pain with insidious onset that started last fall. pnt reports pain is at worst 8/10, best 0/10, and currently 4/10 on NPS. pnt describes pain as achy and dull. aggrevating  factors include pushing/pulling, sleeping, bed mobility, opening a jar, squeezing shampoo/conditioner bottles, overheard activites, washing her hair, reaching behind her, picking things up, taking her bra off, and cold weather. pain is worse in the morning. easing factors include steroid injections and rest. pnt goal is strengthing and exercise. pnt denys weight change, b/b dysfunction, n/v, fever, night pain, or falls within the last 6 months    Limitations Lifting;House hold activities    How long can you sit comfortably? unlimited    How long can you stand comfortably? unlimited    How long can you walk comfortably? unlimited    Patient Stated Goals to increase her strength and learn exercises    Currently in Pain? No/denies    Pain Score 0-No pain              Ther-Ex: Pulleys 2 min flex and 2 min abd- mod tactile cueing for full ROM Supine Chin Tucks - mod cueing for form and DNF activation  Seated Scapular retractions YTB 3 x 12 - mod form cueing and TC for scapular retractions  Seated ER GTB 3 x 12- mod tactile cueing for keeping elbows tucked into sides  Pendulums x12 - mod cueing to not engage shoulders         PT Education - 06/20/21 1700  Education Details therex, HEP    Person(s) Educated Patient    Methods Explanation;Demonstration;Tactile cues    Comprehension Verbalized understanding;Returned demonstration              PT Short Term Goals - 06/14/21 1338       PT SHORT TERM GOAL #1   Title Pnt will be independent with HEP in order to progress rehab at home in order to acheive ADLs.    Time 4    Period Weeks    Status New    Target Date 07/12/21      PT SHORT TERM GOAL #2   Title Pnt will decrease max NPS to 6/10 in order to achieve functional ADLs.    Baseline 06/14/21 8/10    Time 4    Period Weeks    Status New    Target Date 07/12/21               PT Long Term Goals - 06/14/21 1340       PT LONG TERM GOAL #1   Title Pnt will  increase functional ER to T3 and IR to T7 in order to dress and put bra on/off independently.    Baseline 2/9/23IR L: c3 R: c5  ER:  L: psis, R: psis    Time 8    Period Weeks    Status New    Target Date 08/09/21      PT LONG TERM GOAL #2   Title pnt will increase bilateral gross shoulder MMT to 4/5 in order to achieve functional ADLs.    Baseline 06/14/21 Flexion: R: 3+/5. L: 3+/5  Abduction: R: 3+/5. L: 3+/5  ER: R: 4/5. L: 3/5  IR: R: 5/5. L: 5/5.    Time 8    Period Weeks    Status New    Target Date 08/09/21      PT LONG TERM GOAL #3   Title pnt will increase bilateral shoulder AROM flexion/abduction to 170 in order to complete overhead ADLs    Baseline 06/14/21 Flexion: R:150. L:140  Abduction: R: 152. L: 105    Time 8    Period Weeks    Status New    Target Date 08/09/21                   Plan - 06/20/21 1702     Clinical Impression Statement pnt presented with a decrease in pain today due to recent bilateral shoulder injections. spt progressed therex to include parascapular endurance and pnt was able to comply with mod tactile cueing. Pnt maintained good motivation throughout session and demonstrated understanding of therex cueing. Pt will progress as able.    Personal Factors and Comorbidities Age;Time since onset of injury/illness/exacerbation;Other    Examination-Activity Limitations Bathing;Lift;Reach Overhead;Carry;Dressing;Bed Mobility;Hygiene/Grooming;Sleep    Examination-Participation Restrictions Cleaning;Laundry;Community Activity;Occupation;Yard Work    [employer] officer Evolving/Moderate complexity    Clinical Decision Making Moderate    Rehab Potential Good    PT Frequency 2x / week    PT Duration 8 weeks    PT Treatment/Interventions ADLs/Self Care Home Management;Electrical Stimulation;Iontophoresis 4mg /ml Dexamethasone;Biofeedback;Gait training;Stair training;Functional mobility training;Therapeutic activities;Therapeutic  exercise;Balance training;Neuromuscular re-education;Patient/family education;Manual techniques;Passive range of motion;Dry needling;Energy conservation;Joint Manipulations;Moist Heat;Ultrasound;Spinal Manipulations    PT Next Visit Plan periscapular MMT, shoulder special tests (impingement), grip strength, progress gentle ROM and shoulder strength program.    PT Home Exercise Plan banded ER , AAROM    Consulted and Agree with Plan of Care Patient  Patient will benefit from skilled therapeutic intervention in order to improve the following deficits and impairments:  Decreased endurance, Decreased mobility, Impaired vision/preception, Decreased range of motion, Improper body mechanics, Pain, Impaired UE functional use, Decreased strength, Decreased activity tolerance, Hypomobility, Increased muscle spasms, Impaired flexibility  Visit Diagnosis: Chronic left shoulder pain  Chronic right shoulder pain  Cramp and spasm     Problem List Patient Active Problem List   Diagnosis Date Noted   Chronic pain of both shoulders 06/01/2021   Arthritis of carpometacarpal [facility]) joint of right thumb 06/01/2021   Encounter for screening colonoscopy    Cecal polyp    Polyp of ascending colon    Polyp of hepatic flexure of colon    Chronic left hip pain 05/09/2019   Venous stasis dermatitis of both lower extremities 11/04/2018   Pruritic erythematous rash 10/24/2018   Hyperlipidemia 05/03/2018   Bilateral hand numbness 05/01/2016   Welcome to Medicare preventive visit 04/30/2015   S/P TAH (total abdominal hysterectomy) 04/28/2015   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, [place], [address] Phone: [phone]   Fax:  [phone]  Name: Helen Mccoy MRN:  [id number] Date of Birth: [birth year]

## 2021-06-22 ENCOUNTER — Other Ambulatory Visit: Payer: Self-pay

## 2021-06-22 ENCOUNTER — Encounter: Payer: Self-pay | Admitting: Gastroenterology

## 2021-06-22 ENCOUNTER — Ambulatory Visit: Payer: PPO | Admitting: Certified Registered Nurse Anesthetist

## 2021-06-22 ENCOUNTER — Ambulatory Visit
Admission: RE | Admit: 2021-06-22 | Discharge: 2021-06-22 | Disposition: A | Discharge status: Home / Self Care | Payer: PPO | Attending: Gastroenterology | Admitting: Gastroenterology

## 2021-06-22 ENCOUNTER — Encounter
Admission: RE | Disposition: A | Discharge status: Home / Self Care | Payer: Self-pay | Source: Home / Self Care | Attending: Gastroenterology

## 2021-06-22 DIAGNOSIS — Z87891 Personal history of nicotine dependence: Secondary | ICD-10-CM | POA: Diagnosis not present

## 2021-06-22 DIAGNOSIS — M1990 Unspecified osteoarthritis, unspecified site: Secondary | ICD-10-CM | POA: Insufficient documentation

## 2021-06-22 DIAGNOSIS — D124 Benign neoplasm of descending colon: Secondary | ICD-10-CM | POA: Diagnosis not present

## 2021-06-22 DIAGNOSIS — Z860100 Personal history of colon polyps, unspecified: Secondary | ICD-10-CM

## 2021-06-22 DIAGNOSIS — K635 Polyp of colon: Secondary | ICD-10-CM | POA: Diagnosis not present

## 2021-06-22 DIAGNOSIS — Z1211 Encounter for screening for malignant neoplasm of colon: Secondary | ICD-10-CM | POA: Diagnosis not present

## 2021-06-22 DIAGNOSIS — K649 Unspecified hemorrhoids: Secondary | ICD-10-CM | POA: Diagnosis not present

## 2021-06-22 HISTORY — PX: COLONOSCOPY WITH PROPOFOL: SHX5780

## 2021-06-22 SURGERY — COLONOSCOPY WITH PROPOFOL
Anesthesia: General

## 2021-06-22 MED ORDER — ONDANSETRON HCL 4 MG/2ML IJ SOLN
INTRAMUSCULAR | Status: DC | PRN
  Administered 2021-06-22: 4 mg via INTRAVENOUS

## 2021-06-22 MED ORDER — PROPOFOL 500 MG/50ML IV EMUL
INTRAVENOUS | Status: DC | PRN
  Administered 2021-06-22: 140 ug/kg/min via INTRAVENOUS

## 2021-06-22 MED ORDER — PROPOFOL 10 MG/ML IV BOLUS
INTRAVENOUS | Status: DC | PRN
  Administered 2021-06-22: 60 mg via INTRAVENOUS

## 2021-06-22 MED ORDER — LIDOCAINE HCL (CARDIAC) PF 100 MG/5ML IV SOSY
PREFILLED_SYRINGE | INTRAVENOUS | Status: DC | PRN
  Administered 2021-06-22: 80 mg via INTRAVENOUS

## 2021-06-22 MED ORDER — SODIUM CHLORIDE 0.9 % IV SOLN: INTRAVENOUS | Status: DC

## 2021-06-22 NOTE — Anesthesia Preprocedure Evaluation (Signed)
Anesthesia Evaluation  Patient identified by MRN, date of birth, ID band Patient awake  General Assessment Comment:  Hx Usher Syndrome (deaf and blind)  Reviewed: Allergy & Precautions, NPO status , Patient's Chart, lab work & pertinent test results  History of Anesthesia Complications Negative for: history of anesthetic complications  Airway Mallampati: II  TM Distance: >3 FB Neck ROM: Full    Dental no notable dental hx. (+) Teeth Intact   Pulmonary neg pulmonary ROS, neg sleep apnea, neg COPD, Patient abstained from smoking.Not current smoker, former smoker,    Pulmonary exam normal breath sounds clear to auscultation       Cardiovascular Exercise Tolerance: Good METS(-) hypertension(-) CAD and (-) Past MI negative cardio ROS  (-) dysrhythmias  Rhythm:Regular Rate:Normal - Systolic murmurs    Neuro/Psych negative neurological ROS  negative psych ROS   GI/Hepatic neg GERD  ,(+)     (-) substance abuse  ,   Endo/Other  neg diabetes  Renal/GU negative Renal ROS     Musculoskeletal  (+) Arthritis ,   Abdominal   Peds  Hematology   Anesthesia Other Findings Past Medical History: No date: Blind     Comment:  retinitis pigmentosia started at 66 years of age 35 /24/1956: Deafness     Comment:  bilateral  Reproductive/Obstetrics                             Anesthesia Physical Anesthesia Plan  ASA: 2  Anesthesia Plan: General   Post-op Pain Management: Minimal or no pain anticipated   Induction: Intravenous  PONV Risk Score and Plan: 3 and Propofol infusion, TIVA and Ondansetron  Airway Management Planned: Nasal Cannula  Additional Equipment: None  Intra-op Plan:   Post-operative Plan:   Informed Consent: I have reviewed the patients History and Physical, chart, labs and discussed the procedure including the risks, benefits and alternatives for the proposed anesthesia with  the patient or authorized representative who has indicated his/her understanding and acceptance.     Dental advisory given and Interpreter used for interveiw (in-person tactile ASL interpreter utilized)  Plan Discussed with: CRNA and Surgeon  Anesthesia Plan Comments: (Discussed risks of anesthesia with patient, including possibility of difficulty with spontaneous ventilation under anesthesia necessitating airway intervention, PONV, and rare risks such as cardiac or respiratory or neurological events, and allergic reactions. Discussed the role of CRNA in patient's perioperative care. Patient understands.)        Anesthesia Quick Evaluation

## 2021-06-22 NOTE — Transfer of Care (Signed)
Immediate Anesthesia Transfer of Care Note  Patient: Helen Mccoy  Procedure(s) Performed: COLONOSCOPY WITH PROPOFOL  Patient Location: Endoscopy Unit  Anesthesia Type:General  Level of Consciousness: drowsy  Airway & Oxygen Therapy: Patient Spontanous Breathing  Post-op Assessment: Report given to RN and Post -op Vital signs reviewed and stable  Post vital signs: Reviewed and stable  Last Vitals:  Vitals Value Taken Time  BP 122/66 06/22/21 1056  Temp 36 C 06/22/21 1056  Pulse 66 06/22/21 1056  Resp    SpO2 100 % 06/22/21 1056    Last Pain:  Vitals:   06/22/21 1056  TempSrc: Temporal  PainSc: Asleep         Complications: No notable events documented.

## 2021-06-22 NOTE — Anesthesia Postprocedure Evaluation (Signed)
Anesthesia Post Note  Patient: Helen Mccoy  Procedure(s) Performed: COLONOSCOPY WITH PROPOFOL  Patient location during evaluation: Endoscopy Anesthesia Type: General Level of consciousness: awake and alert Pain management: pain level controlled Vital Signs Assessment: post-procedure vital signs reviewed and stable Respiratory status: spontaneous breathing, nonlabored ventilation, respiratory function stable and patient connected to nasal cannula oxygen Cardiovascular status: blood pressure returned to baseline and stable Postop Assessment: no apparent nausea or vomiting Anesthetic complications: no   No notable events documented.   Last Vitals:  Vitals:   06/22/21 1106 06/22/21 1116  BP: 111/76 111/69  Pulse: 74 66  Resp:  13  Temp:    SpO2: 100% 99%    Last Pain:  Vitals:   06/22/21 1116  TempSrc:   PainSc: 0-No pain                 Arita Miss

## 2021-06-22 NOTE — H&P (Signed)
Cephas Darby, MD [address]  [place], [place] [address]  Main: [phone]  Fax: [phone] Pager: [phone]  Primary Care Physician:  Crecencio Mc, MD Primary Gastroenterologist:  Dr. Cephas Darby  Pre-Procedure History & Physical: HPI:  Helen Mccoy is a 67 y.o. female is here for an colonoscopy.   Past Medical History:  Diagnosis Date   Blind    retinitis pigmentosia started at 66 years of age   Deafness 67 /[birth year]1956   bilateral    Past Surgical History:  Procedure Laterality Date   ABDOMINAL HYSTERECTOMY  1986   nonmalignant reasons   COLONOSCOPY WITH PROPOFOL N/A 06/08/2020   Procedure: COLONOSCOPY WITH PROPOFOL;  Surgeon: Virgel Manifold, MD;  Location: [facility] ENDOSCOPY;  Service: Endoscopy;  Laterality: N/A;  TACTIILE SIGN LANGUAGE   COSMETIC SURGERY     EYE SURGERY     Cataract Surgery   TONSILECTOMY, ADENOIDECTOMY, BILATERAL MYRINGOTOMY AND TUBES Bilateral 1968   TONSILLECTOMY     TUBAL LIGATION      Prior to Admission medications   Medication Sig Start Date End Date Taking? Authorizing Provider  azelastine (OPTIVAR) 0.05 % ophthalmic solution Apply 1 drop to eye 2 (two) times daily. 06/06/21  Yes Crecencio Mc, MD  Calcium Carbonate-Vitamin D 500-125 MG-UNIT TABS Take 1 tablet by mouth daily.    [provider]  clobetasol (TEMOVATE) 0.05 % external solution Apply 1 application topically 2 (two) times daily. Patient not taking: Reported on 06/22/2021 06/01/21   Crecencio Mc, MD  diclofenac Sodium (VOLTAREN) 1 % GEL Apply 2 g topically 4 (four) times daily. TO THUMB JOINT 06/01/21   Crecencio Mc, MD    Allergies as of 05/22/2021 - Review Complete 06/08/2020  Allergen Reaction Noted   Sulfa antibiotics Hives and Nausea And Vomiting 02/01/2013    Family History  Problem Relation Age of Onset   Arthritis Mother    Cancer Mother    Breast cancer Mother 59   Hypertension Father    Stroke Father    Macular  degeneration Brother     Social History   Socioeconomic History   Marital status: Divorced    Spouse name: Not on file   Number of children: Not on file   Years of education: Not on file   Highest education level: Not on file  Occupational History   Not on file  Tobacco Use   Smoking status: Former    Packs/day: 1.00    Types: Cigarettes    Quit date: 02/02/1999    Years since quitting: 22.4   Smokeless tobacco: Never  Vaping Use   Vaping Use: Never used  Substance and Sexual Activity   Alcohol use: Yes    Comment: occasionally   Drug use: No   Sexual activity: Not Currently  Other Topics Concern   Not on file  Social History Narrative   Not on file   Social Determinants of Health   Financial Resource Strain: Not on file  Food Insecurity: Not on file  Transportation Needs: Not on file  Physical Activity: Not on file  Stress: Not on file  Social Connections: Not on file  Intimate Partner Violence: Not on file    Review of Systems: See HPI, otherwise negative ROS  Physical Exam: BP 133/88    Pulse 76    Temp 97.8 F (36.6 C) (Temporal)    Resp 20    Ht 5\' 3"  (1.6 m)  Wt 84.8 kg    SpO2 98%    BMI 33.12 kg/m  General:   Alert,  pleasant and cooperative in NAD Head:  Normocephalic and atraumatic. Neck:  Supple; no masses or thyromegaly. Lungs:  Clear throughout to auscultation.    Heart:  Regular rate and rhythm. Abdomen:  Soft, nontender and nondistended. Normal bowel sounds, without guarding, and without rebound.   Neurologic:  Alert and  oriented x4;  grossly normal neurologically.  Impression/Plan: Helen Mccoy is here for an colonoscopy to be performed for h/o colon adenoma  Risks, benefits, limitations, and alternatives regarding  colonoscopy have been reviewed with the patient.  Questions have been answered.  All parties agreeable.   Sherri Sear, MD  06/22/2021, 10:17 AM

## 2021-06-22 NOTE — Anesthesia Procedure Notes (Signed)
Date/Time: 06/22/2021 10:38 AM Performed by: Lily Peer, Zaccary Creech, CRNA Pre-anesthesia Checklist: Patient identified, Emergency Drugs available, Suction available, Patient being monitored and Timeout performed Patient Re-evaluated:Patient Re-evaluated prior to induction Oxygen Delivery Method: Nasal cannula Induction Type: IV induction

## 2021-06-22 NOTE — Op Note (Signed)
[facility] Gastroenterology Patient Name: Helen Mccoy Procedure Date: 06/22/2021 10:22 AM MRN: [id number] Account #: [id number] Date of Birth: 10/25/1955 Admit Type: Outpatient Age: 66 Room: [facility] ENDO ROOM 2 Gender: Female Note Status: Finalized Instrument Name: Park Meo [id number] Procedure:             Colonoscopy Indications:           Surveillance: History of piecemeal removal adenoma on                         last colonoscopy (< 3 yrs) Providers:             Lin Landsman MD, MD Referring MD:          Deborra Medina, MD (Referring MD) Medicines:             General Anesthesia Complications:         No immediate complications. Estimated blood loss: None. Procedure:             Pre-Anesthesia Assessment:                        - Prior to the procedure, a History and Physical was                         performed, and patient medications and allergies were                         reviewed. The patient is competent. The risks and                         benefits of the procedure and the sedation options and                         risks were discussed with the patient. All questions                         were answered and informed consent was obtained.                         Patient identification and proposed procedure were                         verified by the physician, the nurse, the                         anesthesiologist, the anesthetist and the technician                         in the pre-procedure area in the procedure room in the                         endoscopy suite. Mental Status Examination: alert and                         oriented. Airway Examination: normal oropharyngeal                         airway and neck mobility. Respiratory Examination:  clear to auscultation. CV Examination: normal.                         Prophylactic Antibiotics: The patient does not require                         prophylactic  antibiotics. Prior Anticoagulants: The                         patient has taken no previous anticoagulant or                         antiplatelet agents. ASA Grade Assessment: II - A                         patient with mild systemic disease. After reviewing                         the risks and benefits, the patient was deemed in                         satisfactory condition to undergo the procedure. The                         anesthesia plan was to use general anesthesia.                         Immediately prior to administration of medications,                         the patient was re-assessed for adequacy to receive                         sedatives. The heart rate, respiratory rate, oxygen                         saturations, blood pressure, adequacy of pulmonary                         ventilation, and response to care were monitored                         throughout the procedure. The physical status of the                         patient was re-assessed after the procedure.                        After obtaining informed consent, the colonoscope was                         passed under direct vision. Throughout the procedure,                         the patient's blood pressure, pulse, and oxygen                         saturations were monitored continuously. The  Colonoscope was introduced through the anus and                         advanced to the the cecum, identified by appendiceal                         orifice and ileocecal valve. The colonoscopy was                         performed without difficulty. The patient tolerated                         the procedure well. The quality of the bowel                         preparation was evaluated using the BBPS [facility] Bowel                         Preparation Scale) with scores of: Right Colon = 3,                         Transverse Colon = 3 and Left Colon = 3 (entire mucosa                          seen well with no residual staining, small fragments                         of stool or opaque liquid). The total BBPS score                         equals 9. Findings:      The perianal and digital rectal examinations were normal. Pertinent       negatives include normal sphincter tone and no palpable rectal lesions.      A 4 mm polyp was found in the descending colon. The polyp was sessile.       The polyp was removed with a cold snare. Resection and retrieval were       complete. Estimated blood loss: none.      The retroflexed view of the distal rectum and anal verge was normal and       showed no anal or rectal abnormalities.      The exam was otherwise without abnormality. Impression:            - One 4 mm polyp in the descending colon, removed with                         a cold snare. Resected and retrieved.                        - The distal rectum and anal verge are normal on                         retroflexion view.                        - The examination was otherwise normal. Recommendation:        - Discharge patient to home (with escort).                        -  Resume previous diet today.                        - Continue present medications.                        - Await pathology results.                        - Repeat colonoscopy in 7 years for surveillance based                         on pathology results. Procedure Code(s):     --- Professional ---                        [phone], Colonoscopy, flexible; with removal of                         tumor(s), polyp(s), or other lesion(s) by snare                         technique Diagnosis Code(s):     --- Professional ---                        Z86.010, Personal history of colonic polyps                        K63.5, Polyp of colon CPT copyright 2019 American Medical Association. All rights reserved. The codes documented in this report are preliminary and upon coder review may  be revised to meet current compliance  requirements. Dr. Ulyess Mort Lin Landsman MD, MD 06/22/2021 10:55:49 AM This report has been signed electronically. Number of Addenda: 0 Note Initiated On: 06/22/2021 10:22 AM Scope Withdrawal Time: 0 hours 9 minutes 25 seconds  Total Procedure Duration: 0 hours 11 minutes 52 seconds  Estimated Blood Loss:  Estimated blood loss: none.      [facility]

## 2021-06-25 ENCOUNTER — Encounter: Payer: Self-pay | Admitting: Gastroenterology

## 2021-06-25 ENCOUNTER — Other Ambulatory Visit: Payer: Self-pay

## 2021-06-25 ENCOUNTER — Ambulatory Visit: Payer: PPO | Admitting: Physical Therapy

## 2021-06-25 DIAGNOSIS — M25512 Pain in left shoulder: Secondary | ICD-10-CM

## 2021-06-25 DIAGNOSIS — G8929 Other chronic pain: Secondary | ICD-10-CM

## 2021-06-25 NOTE — Therapy (Deleted)
[place] [facility] [address] [place], [address] Phone: [phone]   Fax:  [phone]  Physical Therapy Treatment  Patient Details  Name: Helen Mccoy MRN: [id number] Date of Birth: 08/22/1955 No data recorded  Encounter Date: 06/25/2021   Treatment session not completed this date - please see note from Patrina Levering, DPT on 06/26/21.   Past Medical History:  Diagnosis Date   Blind    retinitis pigmentosia started at 66 years of age   Deafness 67 /[birth year]1956   bilateral    Past Surgical History:  Procedure Laterality Date   ABDOMINAL HYSTERECTOMY  1986   nonmalignant reasons   COLONOSCOPY WITH PROPOFOL N/A 06/08/2020   Procedure: COLONOSCOPY WITH PROPOFOL;  Surgeon: Virgel Manifold, MD;  Location: [facility] ENDOSCOPY;  Service: Endoscopy;  Laterality: N/A;  TACTIILE SIGN LANGUAGE   COSMETIC SURGERY     EYE SURGERY     Cataract Surgery   TONSILECTOMY, ADENOIDECTOMY, BILATERAL MYRINGOTOMY AND TUBES Bilateral 1968   TONSILLECTOMY     TUBAL LIGATION      There were no vitals filed for this visit.     Ther-Ex Pulleys 2 min flexion and 2 min abduction  Seated Scapular retractions GTB 3 x 12- mod tactile cueing for scapular recruitment  Seated ER with GTB 3 x 12 - mod tactile cueing to keep elbows attached to ribcage  Seated Lat Pull Downs 3 x 12 - mod tactile cueing to keep shoulders down and maintain neutral spine  Shoulder rolls x12 Standing low rows 3 x 10                             PT Short Term Goals - 06/14/21 1338       PT SHORT TERM GOAL #1   Title Pnt will be independent with HEP in order to progress rehab at home in order to acheive ADLs.    Time 4    Period Weeks    Status New    Target Date 07/12/21      PT SHORT TERM GOAL #2   Title Pnt will decrease max NPS to 6/10 in order to achieve functional ADLs.    Baseline 06/14/21 8/10    Time 4    Period Weeks     Status New    Target Date 07/12/21               PT Long Term Goals - 06/14/21 1340       PT LONG TERM GOAL #1   Title Pnt will increase functional ER to T3 and IR to T7 in order to dress and put bra on/off independently.    Baseline 2/9/23IR L: c3 R: c5  ER:  L: psis, R: psis    Time 8    Period Weeks    Status New    Target Date 08/09/21      PT LONG TERM GOAL #2   Title pnt will increase bilateral gross shoulder MMT to 4/5 in order to achieve functional ADLs.    Baseline 06/14/21 Flexion: R: 3+/5. L: 3+/5  Abduction: R: 3+/5. L: 3+/5  ER: R: 4/5. L: 3/5  IR: R: 5/5. L: 5/5.    Time 8    Period Weeks    Status New    Target Date 08/09/21      PT LONG TERM GOAL #3   Title pnt will increase  bilateral shoulder AROM flexion/abduction to 170 in order to complete overhead ADLs    Baseline 06/14/21 Flexion: R:150. L:140  Abduction: R: 152. L: 105    Time 8    Period Weeks    Status New    Target Date 08/09/21                    Patient will benefit from skilled therapeutic intervention in order to improve the following deficits and impairments:     Visit Diagnosis: No diagnosis found.     Problem List Patient Active Problem List   Diagnosis Date Noted   Hx of colonic polyps    Polyp of descending colon    Chronic pain of both shoulders 06/01/2021   Arthritis of carpometacarpal [facility]) joint of right thumb 06/01/2021   Encounter for screening colonoscopy    Cecal polyp    Polyp of ascending colon    Polyp of hepatic flexure of colon    Chronic left hip pain 05/09/2019   Venous stasis dermatitis of both lower extremities 11/04/2018   Pruritic erythematous rash 10/24/2018   Hyperlipidemia 05/03/2018   Bilateral hand numbness 05/01/2016   Welcome to Medicare preventive visit 04/30/2015   S/P TAH (total abdominal hysterectomy) 04/28/2015   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, [place], [address] Phone: [phone]   Fax:  [phone]  Name: Helen Mccoy MRN: [id number] Date of Birth: [birth year]

## 2021-06-26 ENCOUNTER — Encounter: Payer: Self-pay | Admitting: Gastroenterology

## 2021-06-26 LAB — SURGICAL PATHOLOGY

## 2021-06-26 NOTE — Therapy (Signed)
[place] [facility] [address] [place], [address] Phone: [phone]   Fax:  [phone]  Patient Details  Name: Helen Mccoy MRN: [id number] Date of Birth: 11-16-1955 Referring Provider:  Crecencio Mc, MD  Encounter Date: 06/25/2021  Pt arrived for session on time however no interpreter present. There was a miscommunication within staffing resulting in an interpreter not being scheduled. Staff apologized for the inconvenience and have set up an interpreter for the remainder of patient visits.    Patrina Levering PT, DPT   [place] [facility] [address], [place], [address] Phone: [phone]   Fax:  [phone]

## 2021-06-26 NOTE — Therapy (Deleted)
[place] [facility] [address], [place], [address] Phone: [phone]   Fax:  [phone]  Physical Therapy Treatment  Patient Details  Name: Helen Mccoy MRN: [id number] Date of Birth: [birth year] No data recorded  Encounter Date: 06/25/2021    Treatment session not completed this date - please see note from Patrina Levering, DPT on 06/26/21.       PT End of Session - 06/26/21 1240     Visit Number 2    Number of Visits 17    Date for PT Re-Evaluation 05/10/20    Authorization - Visit Number 2    Authorization - Number of Visits 17    PT Start Time 1115    PT Stop Time 1125    PT Time Calculation (min) 10 min    Activity Tolerance Patient tolerated treatment well;Patient limited by fatigue;Patient limited by pain    Behavior During Therapy [facility] for tasks assessed/performed             Past Medical History:  Diagnosis Date   Blind    retinitis pigmentosia started at 66 years of age   Deafness 31 /24/1956   bilateral    Past Surgical History:  Procedure Laterality Date   ABDOMINAL HYSTERECTOMY  1986   nonmalignant reasons   COLONOSCOPY WITH PROPOFOL N/A 06/08/2020   Procedure: COLONOSCOPY WITH PROPOFOL;  Surgeon: Virgel Manifold, MD;  Location: [facility] ENDOSCOPY;  Service: Endoscopy;  Laterality: N/A;  TACTIILE SIGN LANGUAGE   COLONOSCOPY WITH PROPOFOL N/A 06/22/2021   Procedure: COLONOSCOPY WITH PROPOFOL;  Surgeon: Lin Landsman, MD;  Location: [facility] ENDOSCOPY;  Service: Gastroenterology;  Laterality: N/A;  [employer] controller Requested (LG) ***Interpreter to arrive at 9:30am***   COSMETIC SURGERY     EYE SURGERY     Cataract Surgery   TONSILECTOMY, ADENOIDECTOMY, BILATERAL MYRINGOTOMY AND TUBES Bilateral 1968   TONSILLECTOMY     TUBAL LIGATION      There were no vitals filed for this visit.     Ther-Ex Pulleys 2 min flexion and 2 min abduction  Seated  Scapular retractions GTB 3 x 12- mod tactile cueing for scapular recruitment  Seated ER with GTB 3 x 12 - mod tactile cueing to keep elbows attached to ribcage  Seated Lat Pull Downs 3 x 12 - mod tactile cueing to keep shoulders down and maintain neutral spine  Shoulder rolls x12 Standing low rows 3 x 10                             PT Short Term Goals - 06/14/21 1338       PT SHORT TERM GOAL #1   Title Pnt will be independent with HEP in order to progress rehab at home in order to acheive ADLs.    Time 4    Period Weeks    Status New    Target Date 07/12/21      PT SHORT TERM GOAL #2   Title Pnt will decrease max NPS to 6/10 in order to achieve functional ADLs.    Baseline 06/14/21 8/10    Time 4    Period Weeks    Status New    Target Date 07/12/21               PT Long Term Goals - 06/14/21 1340       PT LONG TERM GOAL #1  Title Pnt will increase functional ER to T3 and IR to T7 in order to dress and put bra on/off independently.    Baseline 2/9/23IR L: c3 R: c5  ER:  L: psis, R: psis    Time 8    Period Weeks    Status New    Target Date 08/09/21      PT LONG TERM GOAL #2   Title pnt will increase bilateral gross shoulder MMT to 4/5 in order to achieve functional ADLs.    Baseline 06/14/21 Flexion: R: 3+/5. L: 3+/5  Abduction: R: 3+/5. L: 3+/5  ER: R: 4/5. L: 3/5  IR: R: 5/5. L: 5/5.    Time 8    Period Weeks    Status New    Target Date 08/09/21      PT LONG TERM GOAL #3   Title pnt will increase bilateral shoulder AROM flexion/abduction to 170 in order to complete overhead ADLs    Baseline 06/14/21 Flexion: R:150. L:140  Abduction: R: 152. L: 105    Time 8    Period Weeks    Status New    Target Date 08/09/21                    Patient will benefit from skilled therapeutic intervention in order to improve the following deficits and impairments:     Visit Diagnosis: Chronic left shoulder pain  Chronic right shoulder  pain     Problem List Patient Active Problem List   Diagnosis Date Noted   Hx of colonic polyps    Polyp of descending colon    Chronic pain of both shoulders 06/01/2021   Arthritis of carpometacarpal (CMC) joint of right thumb 06/01/2021   Encounter for screening colonoscopy    Cecal polyp    Polyp of ascending colon    Polyp of hepatic flexure of colon    Chronic left hip pain 05/09/2019   Venous stasis dermatitis of both lower extremities 11/04/2018   Pruritic erythematous rash 10/24/2018   Hyperlipidemia 05/03/2018   Bilateral hand numbness 05/01/2016   Welcome to Medicare preventive visit 04/30/2015   S/P TAH (total abdominal hysterectomy) 04/28/2015   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, [place], [address] Phone: [phone]   Fax:  [phone]  Name: Helen Mccoy MRN: [id number] Date of Birth: March 24, 1956

## 2021-06-27 ENCOUNTER — Ambulatory Visit: Payer: PPO | Admitting: Physical Therapy

## 2021-06-28 ENCOUNTER — Other Ambulatory Visit: Payer: Self-pay

## 2021-06-28 DIAGNOSIS — Z1329 Encounter for screening for other suspected endocrine disorder: Secondary | ICD-10-CM

## 2021-06-28 DIAGNOSIS — E782 Mixed hyperlipidemia: Secondary | ICD-10-CM

## 2021-06-28 DIAGNOSIS — R7301 Impaired fasting glucose: Secondary | ICD-10-CM

## 2021-06-28 DIAGNOSIS — R5383 Other fatigue: Secondary | ICD-10-CM

## 2021-07-02 ENCOUNTER — Ambulatory Visit: Payer: PPO | Admitting: Physical Therapy

## 2021-07-02 ENCOUNTER — Encounter: Payer: Self-pay | Admitting: Physical Therapy

## 2021-07-02 ENCOUNTER — Other Ambulatory Visit: Payer: Self-pay

## 2021-07-02 DIAGNOSIS — R252 Cramp and spasm: Secondary | ICD-10-CM

## 2021-07-02 DIAGNOSIS — M25512 Pain in left shoulder: Secondary | ICD-10-CM | POA: Diagnosis not present

## 2021-07-02 DIAGNOSIS — G8929 Other chronic pain: Secondary | ICD-10-CM

## 2021-07-02 NOTE — Therapy (Deleted)
[place] [facility] [address] [place], [address] Phone: [phone]   Fax:  [phone]   This entire session was performed under direct supervision and direction of a licensed therapist/therapist assistant . I have personally read, edited and approve of the note as written.  Patrina Levering PT, DPT   Physical Therapy Treatment  Patient Details  Name: Helen Mccoy MRN: [id number] Date of Birth: 10/31/1955 No data recorded  Encounter Date: 07/02/2021   PT End of Session - 07/02/21 1108     Visit Number 3    Number of Visits 17    Authorization - Visit Number --    Authorization - Number of Visits --    Progress Note Due on Visit 10    PT Start Time 1030    PT Stop Time 1100    PT Time Calculation (min) 30 min    Activity Tolerance Patient tolerated treatment well;Patient limited by fatigue    Behavior During Therapy [facility] for tasks assessed/performed             Past Medical History:  Diagnosis Date   Blind    retinitis pigmentosia started at 66 years of age   Deafness 65 /24/1956   bilateral    Past Surgical History:  Procedure Laterality Date   ABDOMINAL HYSTERECTOMY  1986   nonmalignant reasons   COLONOSCOPY WITH PROPOFOL N/A 06/08/2020   Procedure: COLONOSCOPY WITH PROPOFOL;  Surgeon: Virgel Manifold, MD;  Location: [facility] ENDOSCOPY;  Service: Endoscopy;  Laterality: N/A;  TACTIILE SIGN LANGUAGE   COLONOSCOPY WITH PROPOFOL N/A 06/22/2021   Procedure: COLONOSCOPY WITH PROPOFOL;  Surgeon: Lin Landsman, MD;  Location: [facility] ENDOSCOPY;  Service: Gastroenterology;  Laterality: N/A;  [employer] controller Requested (LG) ***Interpreter to arrive at 9:30am***   COSMETIC SURGERY     EYE SURGERY     Cataract Surgery   TONSILECTOMY, ADENOIDECTOMY, BILATERAL MYRINGOTOMY AND TUBES Bilateral 1968   TONSILLECTOMY     TUBAL LIGATION        Subjective Assessment -  07/02/21 1030     Subjective Pnt reports stiffness and no pain today. HEP is going well    Patient is accompained by: Interpreter    Pertinent History 66 y.o. female pnt presenst with chronic bilateral shoulder pain with insidious onset that started last fall. pnt reports pain is at worst 8/10, best 0/10, and currently 4/10 on NPS. pnt describes pain as achy and dull. aggrevating factors include pushing/pulling, sleeping, bed mobility, opening a jar, squeezing shampoo/conditioner bottles, overheard activites, washing her hair, reaching behind her, picking things up, taking her bra off, and cold weather. pain is worse in the morning. easing factors include steroid injections and rest. pnt goal is strengthing and exercise. pnt denys weight change, b/b dysfunction, n/v, fever, night pain, or falls within the last 6 months    Limitations Lifting;House hold activities    How long can you sit comfortably? unlimited    How long can you stand comfortably? unlimited    How long can you walk comfortably? unlimited    Patient Stated Goals to increase her strength and learn exercises    Currently in Pain? No/denies    Pain Score 0-No pain               Ther-Ex Pulleys 2 min flexion and 2 min abduction - min cueing for straight elbows  Seated Scapular retractions GTB 3 x 12- mod tactile cueing  for scapular recruitment  Seated Lat Pull Downs 3 x 12 - mod tactile cueing to keep shoulders down and maintain neutral spine  Standing shoulder flexion #5 3 x 8 - cueing to keep elbows straight    HEP updated to include bilateral shoulder flexion with one #5 weight 3 x 8            PT Education - 07/02/21 1106     Education Details therex form and HEP    Person(s) Educated Patient    Methods Explanation;Demonstration;Tactile cues    Comprehension Verbalized understanding;Returned demonstration              PT Short Term Goals - 06/14/21 1338       PT SHORT TERM GOAL #1   Title Pnt  will be independent with HEP in order to progress rehab at home in order to acheive ADLs.    Time 4    Period Weeks    Status New    Target Date 07/12/21      PT SHORT TERM GOAL #2   Title Pnt will decrease max NPS to 6/10 in order to achieve functional ADLs.    Baseline 06/14/21 8/10    Time 4    Period Weeks    Status New    Target Date 07/12/21               PT Long Term Goals - 06/14/21 1340       PT LONG TERM GOAL #1   Title Pnt will increase functional ER to T3 and IR to T7 in order to dress and put bra on/off independently.    Baseline 2/9/23IR L: c3 R: c5  ER:  L: psis, R: psis    Time 8    Period Weeks    Status New    Target Date 08/09/21      PT LONG TERM GOAL #2   Title pnt will increase bilateral gross shoulder MMT to 4/5 in order to achieve functional ADLs.    Baseline 06/14/21 Flexion: R: 3+/5. L: 3+/5  Abduction: R: 3+/5. L: 3+/5  ER: R: 4/5. L: 3/5  IR: R: 5/5. L: 5/5.    Time 8    Period Weeks    Status New    Target Date 08/09/21      PT LONG TERM GOAL #3   Title pnt will increase bilateral shoulder AROM flexion/abduction to 170 in order to complete overhead ADLs    Baseline 06/14/21 Flexion: R:150. L:140  Abduction: R: 152. L: 105    Time 8    Period Weeks    Status New    Target Date 08/09/21                   Plan - 07/02/21 1108     Clinical Impression Statement Pnt presented with no pain today and SPT progressed therex to include lower trap endurance and shoulder dynamic mobility for postural endurance. Pnt  was able to comply with moderate tactile cueing from SPT on therex form and sequencing. Pnt experienced global UE fatigue following therex and was educated on  delayed onset muscle soreness. HEP was updated to include shoulder flexion strengthening. Pnt maintained good motivation throughout session  and demonstrated understanding of postural cues. PT will progress as able.    Personal Factors and Comorbidities Age;Time since onset of  injury/illness/exacerbation;Other    Examination-Activity Limitations Bathing;Lift;Reach Overhead;Carry;Dressing;Bed Mobility;Hygiene/Grooming;Sleep    Examination-Participation Restrictions Cleaning;Laundry;Community Activity;Occupation;[employer] Work  Stability/Clinical Decision Making Evolving/Moderate complexity    Clinical Decision Making Moderate    Rehab Potential Good    PT Frequency 2x / week    PT Duration 8 weeks    PT Treatment/Interventions ADLs/Self Care Home Management;Electrical Stimulation;Iontophoresis 4mg /ml Dexamethasone;Biofeedback;Gait training;Stair training;Functional mobility training;Therapeutic activities;Therapeutic exercise;Balance training;Neuromuscular re-education;Patient/family education;Manual techniques;Passive range of motion;Dry needling;Energy conservation;Joint Manipulations;Moist Heat;Ultrasound;Spinal Manipulations    PT Next Visit Plan periscapular MMT, shoulder special tests (impingement), grip strength, progress gentle ROM and shoulder strength program.    PT Home Exercise Plan banded ER , AAROM    Consulted and Agree with Plan of Care Patient             Patient will benefit from skilled therapeutic intervention in order to improve the following deficits and impairments:  Decreased endurance, Decreased mobility, Impaired vision/preception, Decreased range of motion, Improper body mechanics, Pain, Impaired UE functional use, Decreased strength, Decreased activity tolerance, Hypomobility, Increased muscle spasms, Impaired flexibility  Visit Diagnosis: Chronic left shoulder pain  Chronic right shoulder pain  Cramp and spasm     Problem List Patient Active Problem List   Diagnosis Date Noted   Hx of colonic polyps    Polyp of descending colon    Chronic pain of both shoulders 06/01/2021   Arthritis of carpometacarpal (CMC) joint of right thumb 06/01/2021   Encounter for screening colonoscopy    Cecal polyp    Polyp of ascending colon     Polyp of hepatic flexure of colon    Chronic left hip pain 05/09/2019   Venous stasis dermatitis of both lower extremities 11/04/2018   Pruritic erythematous rash 10/24/2018   Hyperlipidemia 05/03/2018   Bilateral hand numbness 05/01/2016   Welcome to Medicare preventive visit 04/30/2015   S/P TAH (total abdominal hysterectomy) 04/28/2015   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 [place], [address] Phone: [phone]   Fax:  [phone]  Name: Helen Mccoy MRN: [id number] Date of Birth: [birth year]

## 2021-07-03 ENCOUNTER — Encounter: Payer: Self-pay | Admitting: Physical Therapy

## 2021-07-03 NOTE — Therapy (Signed)
This entire session was performed under direct supervision and direction of a licensed therapist/therapist assistant . I have personally read, edited and approve of the note as written.  Patrina Levering PT, DPT    [place] [facility] [address], [place], [address] Phone: [phone]   Fax:  [phone]  Physical Therapy Treatment  Patient Details  Name: Helen Mccoy MRN: [id number] Date of Birth: Sep 05, 1955 No data recorded  Encounter Date: 07/02/2021   PT End of Session - 07/03/21 0827     Visit Number 3    Number of Visits 17    Date for PT Re-Evaluation 08/09/21    Progress Note Due on Visit 10    PT Start Time 1030    PT Stop Time 1100    PT Time Calculation (min) 30 min    Activity Tolerance Patient tolerated treatment well;Patient limited by fatigue    Behavior During Therapy [facility] for tasks assessed/performed             Past Medical History:  Diagnosis Date   Blind    retinitis pigmentosia started at 66 years of age   Deafness 54 /[birth year]1956   bilateral    Past Surgical History:  Procedure Laterality Date   ABDOMINAL HYSTERECTOMY  1986   nonmalignant reasons   COLONOSCOPY WITH PROPOFOL N/A 06/08/2020   Procedure: COLONOSCOPY WITH PROPOFOL;  Surgeon: Virgel Manifold, MD;  Location: [facility] ENDOSCOPY;  Service: Endoscopy;  Laterality: N/A;  TACTIILE SIGN LANGUAGE   COLONOSCOPY WITH PROPOFOL N/A 06/22/2021   Procedure: COLONOSCOPY WITH PROPOFOL;  Surgeon: Lin Landsman, MD;  Location: [facility] ENDOSCOPY;  Service: Gastroenterology;  Laterality: N/A;  [employer] controller Requested (LG) Interpreter to arrive at 9:30am   [place] Surgery   TONSILECTOMY, ADENOIDECTOMY, BILATERAL MYRINGOTOMY AND TUBES Bilateral [place]      There were no vitals filed for this visit.   Subjective Assessment -  07/03/21 0826     Subjective Pnt reports stiffness and no pain today. HEP is going well    Patient is accompained by: Interpreter    Pertinent History 66 y.o. female pnt presenst with chronic bilateral shoulder pain with insidious onset that started last fall. pnt reports pain is at worst 8/10, best 0/10, and currently 4/10 on NPS. pnt describes pain as achy and dull. aggrevating factors include pushing/pulling, sleeping, bed mobility, opening a jar, squeezing shampoo/conditioner bottles, overheard activites, washing her hair, reaching behind her, picking things up, taking her bra off, and cold weather. pain is worse in the morning. easing factors include steroid injections and rest. pnt goal is strengthing and exercise. pnt denys weight change, b/b dysfunction, n/v, fever, night pain, or falls within the last 6 months    Limitations Lifting;House hold activities    How long can you sit comfortably? unlimited    How long can you stand comfortably? unlimited    How long can you walk comfortably? unlimited    Patient Stated Goals to increase her strength and learn exercises    Currently in Pain? No/denies                 Ther-Ex   Pulleys 2 min flexion and 2 min abduction - min cueing for straight elbows   Seated Scapular retractions GTB 3 x 12- mod tactile cueing for scapular recruitment  Seated Lat Pull Downs 3 x 12 - mod tactile cueing to keep shoulders down and maintain neutral spine   Standing shoulder flexion #5 3 x 8 - cueing to keep elbows straight    HEP updated to include bilateral shoulder flexion with one #5 weight 3 x 8             PT Education - 07/02/21 1106     Education Details therex form and HEP    Person(s) Educated Patient    Methods Explanation;Demonstration;Tactile cues    Comprehension Verbalized understanding;Returned demonstration              PT Short Term Goals - 06/14/21 1338       PT SHORT TERM GOAL #1   Title Pnt will be  independent with HEP in order to progress rehab at home in order to acheive ADLs.    Time 4    Period Weeks    Status New    Target Date 07/12/21      PT SHORT TERM GOAL #2   Title Pnt will decrease max NPS to 6/10 in order to achieve functional ADLs.    Baseline 06/14/21 8/10    Time 4    Period Weeks    Status New    Target Date 07/12/21               PT Long Term Goals - 06/14/21 1340       PT LONG TERM GOAL #1   Title Pnt will increase functional ER to T3 and IR to T7 in order to dress and put bra on/off independently.    Baseline 2/9/23IR L: c3 R: c5  ER:  L: psis, R: psis    Time 8    Period Weeks    Status New    Target Date 08/09/21      PT LONG TERM GOAL #2   Title pnt will increase bilateral gross shoulder MMT to 4/5 in order to achieve functional ADLs.    Baseline 06/14/21 Flexion: R: 3+/5. L: 3+/5  Abduction: R: 3+/5. L: 3+/5  ER: R: 4/5. L: 3/5  IR: R: 5/5. L: 5/5.    Time 8    Period Weeks    Status New    Target Date 08/09/21      PT LONG TERM GOAL #3   Title pnt will increase bilateral shoulder AROM flexion/abduction to 170 in order to complete overhead ADLs    Baseline 06/14/21 Flexion: R:150. L:140  Abduction: R: 152. L: 105    Time 8    Period Weeks    Status New    Target Date 08/09/21                   Plan - 07/03/21 0829     Clinical Impression Statement Pnt presented with no pain today and SPT progressed therex to include lower trap endurance and shoulder dynamic mobility for postural endurance. Pnt  was able to comply with moderate tactile cueing from SPT on therex form and sequencing. Pnt experienced global UE fatigue following therex and was educated on  delayed onset muscle soreness. HEP was updated to include shoulder flexion strengthening. Pnt maintained good motivation throughout session  and demonstrated understanding of postural cues. PT will progress as able.    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 Evolving/Moderate complexity    Rehab Potential Good    PT Frequency 2x / week    PT Duration 8 weeks    PT Treatment/Interventions ADLs/Self Care Home Management;Electrical Stimulation;Iontophoresis 4mg /ml Dexamethasone;Biofeedback;Gait training;Stair training;Functional mobility training;Therapeutic activities;Therapeutic exercise;Balance training;Neuromuscular re-education;Patient/family education;Manual techniques;Passive range of motion;Dry needling;Energy conservation;Joint Manipulations;Moist Heat;Ultrasound;Spinal Manipulations    PT Next Visit Plan periscapular MMT, shoulder special tests (impingement), grip strength, progress gentle ROM and shoulder strength program.    PT Home Exercise Plan banded ER , AAROM    Consulted and Agree with Plan of Care Patient             Patient will benefit from skilled therapeutic intervention in order to improve the following deficits and impairments:  Decreased endurance, Decreased mobility, Impaired vision/preception, Decreased range of motion, Improper body mechanics, Pain, Impaired UE functional use, Decreased strength, Decreased activity tolerance, Hypomobility, Increased muscle spasms, Impaired flexibility  Visit Diagnosis: Chronic left shoulder pain  Chronic right shoulder pain  Cramp and spasm     Problem List Patient Active Problem List   Diagnosis Date Noted   Hx of colonic polyps    Polyp of descending colon    Chronic pain of both shoulders 06/01/2021   Arthritis of carpometacarpal (CMC) joint of right thumb 06/01/2021   Encounter for screening colonoscopy    Cecal polyp    Polyp of ascending colon    Polyp of hepatic flexure of colon     Chronic left hip pain 05/09/2019   Venous stasis dermatitis of both lower extremities 11/04/2018   Pruritic erythematous rash 10/24/2018   Hyperlipidemia 05/03/2018   Bilateral hand numbness 05/01/2016   Welcome to Medicare preventive visit 04/30/2015   S/P TAH (total abdominal hysterectomy) 04/28/2015   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 [place], [address] Phone: [phone]   Fax:  [phone]  Name: Helen Mccoy MRN: [id number] Date of Birth: [birth year]

## 2021-07-04 ENCOUNTER — Other Ambulatory Visit: Payer: Self-pay

## 2021-07-04 ENCOUNTER — Ambulatory Visit: Payer: PPO | Attending: Internal Medicine | Admitting: Physical Therapy

## 2021-07-04 ENCOUNTER — Encounter: Payer: Self-pay | Admitting: Physical Therapy

## 2021-07-04 DIAGNOSIS — M25511 Pain in right shoulder: Secondary | ICD-10-CM | POA: Diagnosis not present

## 2021-07-04 DIAGNOSIS — R252 Cramp and spasm: Secondary | ICD-10-CM | POA: Insufficient documentation

## 2021-07-04 DIAGNOSIS — G8929 Other chronic pain: Secondary | ICD-10-CM | POA: Insufficient documentation

## 2021-07-04 DIAGNOSIS — M25512 Pain in left shoulder: Secondary | ICD-10-CM | POA: Diagnosis not present

## 2021-07-04 NOTE — Therapy (Signed)
[facility] [address], [place], [address] Phone: [phone]   Fax:  [phone]  Physical Therapy Treatment  Patient Details  Name: Helen Mccoy MRN: [id number] Date of Birth: 06/07/1955 No data recorded  Encounter Date: 07/04/2021   PT End of Session - 07/04/21 1230     Visit Number 4    Number of Visits 17    Date for PT Re-Evaluation 08/09/21    Authorization - Visit Number 4    Authorization - Number of Visits 17    PT Start Time 1030    PT Stop Time 1115    PT Time Calculation (min) 45 min    Activity Tolerance Patient tolerated treatment well;Patient limited by fatigue    Behavior During Therapy [facility] for tasks assessed/performed             Past Medical History:  Diagnosis Date   Blind    retinitis pigmentosia started at 66 years of age   Deafness 65 /24[birth year]1956   bilateral    Past Surgical History:  Procedure Laterality Date   ABDOMINAL HYSTERECTOMY  1986   nonmalignant reasons   COLONOSCOPY WITH PROPOFOL N/A 06/08/2020   Procedure: COLONOSCOPY WITH PROPOFOL;  Surgeon: Virgel Manifold, MD;  Location: [facility] ENDOSCOPY;  Service: Endoscopy;  Laterality: N/A;  TACTIILE SIGN LANGUAGE   COLONOSCOPY WITH PROPOFOL N/A 06/22/2021   Procedure: COLONOSCOPY WITH PROPOFOL;  Surgeon: Lin Landsman, MD;  Location: [facility] ENDOSCOPY;  Service: Gastroenterology;  Laterality: N/A;  [employer] controller Requested (LG) Interpreter to arrive at 9:30am   [place] Surgery   TONSILECTOMY, ADENOIDECTOMY, BILATERAL MYRINGOTOMY AND TUBES Bilateral [place]      There were no vitals filed for this visit.   Subjective Assessment - 07/04/21 1229     Subjective Pnt reports no pain today and being compliant with HEP    Patient is accompained by: Interpreter    Pertinent History 66 y.o. female pnt presenst with  chronic bilateral shoulder pain with insidious onset that started last fall. pnt reports pain is at worst 8/10, best 0/10, and currently 4/10 on NPS. pnt describes pain as achy and dull. aggrevating factors include pushing/pulling, sleeping, bed mobility, opening a jar, squeezing shampoo/conditioner bottles, overheard activites, washing her hair, reaching behind her, picking things up, taking her bra off, and cold weather. pain is worse in the morning. easing factors include steroid injections and rest. pnt goal is strengthing and exercise. pnt denys weight change, b/b dysfunction, n/v, fever, night pain, or falls within the last 6 months    Limitations Lifting;House hold activities    How long can you sit comfortably? unlimited    How long can you stand comfortably? unlimited    How long can you walk comfortably? unlimited    Patient Stated Goals to increase her strength and learn exercises    Currently in Pain? No/denies                Therex Pulleys 2 min flexion and 2 min abduction- min TC for form and keeping elbows straight  Shoulder bilateral ER with GTB 3 x 10 - min TC to keep elbows into sides Standing lat Pull downs 15# 3 x 6 - good carryover from last session  Standing rows rows with MATRIX  10# 3 x 10 - mod TC  to engage scapular retractors  30 min Total   Manual Therapy  Left UT STM  Progressive TP release to left UT 15 min Total           PT Education - 07/04/21 1230     Education Details therex form    Person(s) Educated Patient    Methods Explanation;Tactile cues;Verbal cues    Comprehension Verbalized understanding;Returned demonstration              PT Short Term Goals - 06/14/21 1338       PT SHORT TERM GOAL #1   Title Pnt will be independent with HEP in order to progress rehab at home in order to acheive ADLs.    Time 4    Period Weeks    Status New    Target Date 07/12/21      PT SHORT TERM GOAL #2   Title Pnt will decrease max NPS to  6/10 in order to achieve functional ADLs.    Baseline 06/14/21 8/10    Time 4    Period Weeks    Status New    Target Date 07/12/21               PT Long Term Goals - 06/14/21 1340       PT LONG TERM GOAL #1   Title Pnt will increase functional ER to T3 and IR to T7 in order to dress and put bra on/off independently.    Baseline 2/9/23IR L: c3 R: c5  ER:  L: psis, R: psis    Time 8    Period Weeks    Status New    Target Date 08/09/21      PT LONG TERM GOAL #2   Title pnt will increase bilateral gross shoulder MMT to 4/5 in order to achieve functional ADLs.    Baseline 06/14/21 Flexion: R: 3+/5. L: 3+/5  Abduction: R: 3+/5. L: 3+/5  ER: R: 4/5. L: 3/5  IR: R: 5/5. L: 5/5.    Time 8    Period Weeks    Status New    Target Date 08/09/21      PT LONG TERM GOAL #3   Title pnt will increase bilateral shoulder AROM flexion/abduction to 170 in order to complete overhead ADLs    Baseline 06/14/21 Flexion: R:150. L:140  Abduction: R: 152. L: 105    Time 8    Period Weeks    Status New    Target Date 08/09/21                   Plan - 07/04/21 1231     Clinical Impression Statement Pnt presented with no pain today and SPT performed manual therapy (STM and TP Release) to left UT to increase shoulder functional ROM. Therex was increased to include bilateral lower trap and scapular retractor strengthening. Pnt tolerated manual therapy and therex well with mod TC for form and sequencing. SPT discussed ergonomic home set up with brail to encourage proper posture throughout the day and limiting how much she reaches up over 90 degrees to sign to her boyfriend and instead encouraging him to come down to her level and functional range. Pnt demonstrated understanding of postural cues and maintained good motivation throughout session. 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 Evolving/Moderate complexity    Clinical Decision Making Moderate    Rehab Potential Good    PT Frequency 2x / week    PT Duration 8 weeks    PT Treatment/Interventions ADLs/Self Care Home Management;Electrical Stimulation;Iontophoresis 4mg /ml Dexamethasone;Biofeedback;Gait training;Stair training;Functional mobility training;Therapeutic activities;Therapeutic exercise;Balance training;Neuromuscular re-education;Patient/family education;Manual techniques;Passive range of motion;Dry needling;Energy conservation;Joint Manipulations;Moist Heat;Ultrasound;Spinal Manipulations    PT Next Visit Plan periscapular MMT, shoulder special tests (impingement), grip strength, progress gentle ROM and shoulder strength program.    PT Home Exercise Plan banded ER , AAROM    Consulted and Agree with Plan of Care Patient             Patient will benefit from skilled therapeutic intervention in order to improve the following deficits and impairments:  Decreased endurance, Decreased mobility, Impaired vision/preception, Decreased range of motion, Improper body mechanics, Pain, Impaired UE functional use, Decreased strength, Decreased activity tolerance, Hypomobility, Increased muscle spasms, Impaired flexibility  Visit Diagnosis: Chronic left shoulder pain  Chronic right shoulder pain  Cramp and spasm     Problem List Patient Active Problem List   Diagnosis Date Noted   Hx of colonic polyps    Polyp of descending colon    Chronic pain of both shoulders 06/01/2021   Arthritis of carpometacarpal (CMC) joint of right thumb 06/01/2021   Encounter for screening colonoscopy    Cecal polyp    Polyp of ascending colon    Polyp of hepatic flexure of colon    Chronic left hip pain 05/09/2019    Venous stasis dermatitis of both lower extremities 11/04/2018   Pruritic erythematous rash 10/24/2018   Hyperlipidemia 05/03/2018   Bilateral hand numbness 05/01/2016   Welcome to Medicare preventive visit 04/30/2015   S/P TAH (total abdominal hysterectomy) 04/28/2015   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 [place], [address] Phone: [phone]   Fax:  [phone]  Name: Helen Mccoy MRN: [id number] Date of Birth: 1955-10-06

## 2021-07-10 ENCOUNTER — Ambulatory Visit: Payer: PPO | Admitting: Physical Therapy

## 2021-07-10 NOTE — Patient Instructions (Incomplete)
**  BIL SHOULDER PAIN s/p fall** ? ?INTERVENTIONS ?  ?Therex ?Pulleys 2 min flexion and 2 min abduction- min TC for form and keeping elbows straight  ?Shoulder bilateral ER with GTB 3 x 10 - min TC to keep elbows into sides ?Standing lat Pull downs 15# 3 x 6 - good carryover from last session  ?Standing rows rows with MATRIX  10# 3 x 10 - mod TC to engage scapular retractors  ?30 min Total  ?  ?Manual Therapy  ?Left UT STM  ?Progressive TP release to left UT ?15 min Total  ?  ?*aggravating factors include pushing/pulling, sleeping, bed mobility, opening a jar, squeezing shampoo/conditioner bottles, overheard activites, washing her hair, reaching behind her, picking things up, taking her bra off* ?

## 2021-07-12 ENCOUNTER — Ambulatory Visit: Payer: PPO | Admitting: Physical Therapy

## 2021-07-12 ENCOUNTER — Other Ambulatory Visit: Payer: Self-pay

## 2021-07-12 ENCOUNTER — Encounter: Payer: Self-pay | Admitting: Physical Therapy

## 2021-07-12 DIAGNOSIS — M25512 Pain in left shoulder: Secondary | ICD-10-CM

## 2021-07-12 DIAGNOSIS — G8929 Other chronic pain: Secondary | ICD-10-CM

## 2021-07-12 NOTE — Therapy (Signed)
[facility] [address] [place], [address] Phone: [phone]   Fax:  [phone]  Physical Therapy Treatment  Patient Details  Name: Helen Mccoy MRN: [id number] Date of Birth: [birth year] No data recorded  Encounter Date: 07/12/2021   PT End of Session - 07/12/21 1014     Visit Number 5    Number of Visits 17    Date for PT Re-Evaluation 08/09/21    Authorization - Visit Number 4    Authorization - Number of Visits 17    PT Start Time 1001    PT Stop Time 1045    PT Time Calculation (min) 44 min    Activity Tolerance Patient tolerated treatment well;Patient limited by fatigue    Behavior During Therapy [facility] for tasks assessed/performed             Past Medical History:  Diagnosis Date   Blind    retinitis pigmentosia started at 66 years of age   Deafness 83 /24[birth year]1956   bilateral    Past Surgical History:  Procedure Laterality Date   ABDOMINAL HYSTERECTOMY  1986   nonmalignant reasons   COLONOSCOPY WITH PROPOFOL N/A 06/08/2020   Procedure: COLONOSCOPY WITH PROPOFOL;  Surgeon: Virgel Manifold, MD;  Location: [facility] ENDOSCOPY;  Service: Endoscopy;  Laterality: N/A;  TACTIILE SIGN LANGUAGE   COLONOSCOPY WITH PROPOFOL N/A 06/22/2021   Procedure: COLONOSCOPY WITH PROPOFOL;  Surgeon: Lin Landsman, MD;  Location: [facility] ENDOSCOPY;  Service: Gastroenterology;  Laterality: N/A;  [employer] controller Requested (LG) *Interpreter to arrive at 9:30am*   [place]     Cataract Surgery   TONSILECTOMY, ADENOIDECTOMY, BILATERAL MYRINGOTOMY AND TUBES Bilateral [place]      There were no vitals filed for this visit.   Subjective Assessment - 07/12/21 1009     Subjective Pt states she is doing well today. Reports 3/10 pain in left shoulder, denies pain in right shoulder.    Patient is accompained by: Interpreter     Pertinent History 66 y.o. female pnt presenst with chronic bilateral shoulder pain with insidious onset that started last fall. pnt reports pain is at worst 8/10, best 0/10, and currently 4/10 on NPS. pnt describes pain as achy and dull. aggrevating factors include pushing/pulling, sleeping, bed mobility, opening a jar, squeezing shampoo/conditioner bottles, overheard activites, washing her hair, reaching behind her, picking things up, taking her bra off, and cold weather. pain is worse in the morning. easing factors include steroid injections and rest. pnt goal is strengthing and exercise. pnt denys weight change, b/b dysfunction, n/v, fever, night pain, or falls within the last 6 months    Limitations Lifting;House hold activities    How long can you sit comfortably? unlimited    How long can you stand comfortably? unlimited    How long can you walk comfortably? unlimited    Patient Stated Goals to increase her strength and learn exercises    Currently in Pain? Yes    Pain Score 3     Pain Location Shoulder    Pain Orientation Left    Pain Descriptors / Indicators Aching    Pain Type Chronic pain            **B SHOULDERS**   INTERVENTIONS   Interpreter: Anderson Malta   Therex Pulleys 2 min flexion and 2 min abduction; Shoulder bilateral ER with  GTB 3x10; UT stretch, 2x30 seconds each side; Seated rows using GTB x10 - reps limited by fatigue;   Manual Therapy  PROM into flexion, scaption, abduction and ER LUE GH mobilizations - A-P and sup-inf through various ranges of motion to increase abduction and flexion range; STM to left upper trap utilizing effleurage, ptrissage and TP release;    Clinical Impression: Pt is pleasant and motivated throughout session. She communicates pain/discomfort well through facial expressions during manual therapy. Grade 1-2 mobs utilized for pain relief; plan to increase grades as pt is able to tolerate. She has a trigger point in left upper trap with  increased sensitivity; pain improved following STM. Education on temporary relief and purpose of following up with stretching and strengthening. Left shoulder remains more limited in range and pain than right shoulder. Pt would benefit from continued PT to improve ROM and decrease pain during functional activities at home and while signing with her significant other.            PT Short Term Goals - 06/14/21 1338       PT SHORT TERM GOAL #1   Title Pnt will be independent with HEP in order to progress rehab at home in order to acheive ADLs.    Time 4    Period Weeks    Status New    Target Date 07/12/21      PT SHORT TERM GOAL #2   Title Pnt will decrease max NPS to 6/10 in order to achieve functional ADLs.    Baseline 06/14/21 8/10    Time 4    Period Weeks    Status New    Target Date 07/12/21               PT Long Term Goals - 06/14/21 1340       PT LONG TERM GOAL #1   Title Pnt will increase functional ER to T3 and IR to T7 in order to dress and put bra on/off independently.    Baseline 2/9/23IR L: c3 R: c5  ER:  L: psis, R: psis    Time 8    Period Weeks    Status New    Target Date 08/09/21      PT LONG TERM GOAL #2   Title pnt will increase bilateral gross shoulder MMT to 4/5 in order to achieve functional ADLs.    Baseline 06/14/21 Flexion: R: 3+/5. L: 3+/5  Abduction: R: 3+/5. L: 3+/5  ER: R: 4/5. L: 3/5  IR: R: 5/5. L: 5/5.    Time 8    Period Weeks    Status New    Target Date 08/09/21      PT LONG TERM GOAL #3   Title pnt will increase bilateral shoulder AROM flexion/abduction to 170 in order to complete overhead ADLs    Baseline 06/14/21 Flexion: R:150. L:140  Abduction: R: 152. L: 105    Time 8    Period Weeks    Status New    Target Date 08/09/21                   Plan - 07/12/21 1220     Clinical Impression Statement Pt is pleasant and motivated throughout session. She communicates pain/discomfort well through facial expressions  during manual therapy. Grade 1-2 mobs utilized for pain relief; plan to increase grades as pt is able to tolerate. She has a trigger point in left upper trap with increased sensitivity; pain improved following STM.  Education on temporary relief and purpose of following up with stretching and strengthening. Left shoulder remains more limited in range and pain than right shoulder. Pt would benefit from continued PT to improve ROM and decrease pain during functional activities at home and while signing with her significant other.    Personal Factors and Comorbidities Age;Time since onset of injury/illness/exacerbation;Other    Examination-Activity Limitations Bathing;Lift;Reach Overhead;Carry;Dressing;Bed Mobility;Hygiene/Grooming;Sleep    Examination-Participation Restrictions Cleaning;Laundry;Community Activity;Occupation;Yard Work    Merchant navy officer Evolving/Moderate complexity    Rehab Potential Good    PT Frequency 2x / week    PT Duration 8 weeks    PT Treatment/Interventions ADLs/Self Care Home Management;Electrical Stimulation;Iontophoresis '4mg'$ /ml Dexamethasone;Biofeedback;Gait training;Stair training;Functional mobility training;Therapeutic activities;Therapeutic exercise;Balance training;Neuromuscular re-education;Patient/family education;Manual techniques;Passive range of motion;Dry needling;Energy conservation;Joint Manipulations;Moist Heat;Ultrasound;Spinal Manipulations    PT Next Visit Plan periscapular MMT, shoulder special tests (impingement), grip strength, progress gentle ROM and shoulder strength program.    PT Home Exercise Plan banded ER , AAROM    Consulted and Agree with Plan of Care Patient             Patient will benefit from skilled therapeutic intervention in order to improve the following deficits and impairments:  Decreased endurance, Decreased mobility, Impaired vision/preception, Decreased range of motion, Improper body mechanics, Pain, Impaired UE  functional use, Decreased strength, Decreased activity tolerance, Hypomobility, Increased muscle spasms, Impaired flexibility  Visit Diagnosis: Chronic left shoulder pain  Chronic right shoulder pain     Problem List Patient Active Problem List   Diagnosis Date Noted   Hx of colonic polyps    Polyp of descending colon    Chronic pain of both shoulders 06/01/2021   Arthritis of carpometacarpal (CMC) joint of right thumb 06/01/2021   Encounter for screening colonoscopy    Cecal polyp    Polyp of ascending colon    Polyp of hepatic flexure of colon    Chronic left hip pain 05/09/2019   Venous stasis dermatitis of both lower extremities 11/04/2018   Pruritic erythematous rash 10/24/2018   Hyperlipidemia 05/03/2018   Bilateral hand numbness 05/01/2016   Welcome to Medicare preventive visit 04/30/2015   S/P TAH (total abdominal hysterectomy) 04/28/2015   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, [place], [address] Phone: [phone]   Fax:  [phone]  Name: Helen Mccoy MRN: [id number] Date of Birth: 1955/12/14

## 2021-07-17 ENCOUNTER — Ambulatory Visit: Payer: PPO | Admitting: Physical Therapy

## 2021-07-17 ENCOUNTER — Encounter: Payer: Self-pay | Admitting: Physical Therapy

## 2021-07-17 ENCOUNTER — Other Ambulatory Visit: Payer: Self-pay

## 2021-07-17 DIAGNOSIS — M25512 Pain in left shoulder: Secondary | ICD-10-CM | POA: Diagnosis not present

## 2021-07-17 DIAGNOSIS — G8929 Other chronic pain: Secondary | ICD-10-CM

## 2021-07-17 NOTE — Therapy (Signed)
[facility] ?[address] ?[place], [place], [address] ?Phone: [phone]   Fax:  [phone] ? ?Physical Therapy Treatment ? ?Patient Details  ?Name: Helen Mccoy ?MRN: [id number] ?Date of Birth: 12/31/1955 ?No data recorded ? ?Encounter Date: 07/17/2021 ? ? PT End of Session - 07/17/21 1809   ? ? Visit Number 6   ? Number of Visits 17   ? Date for PT Re-Evaluation 08/09/21   ? Progress Note Due on Visit 10   ? PT Start Time 1003   ? PT Stop Time 1046   ? PT Time Calculation (min) 43 min   ? Activity Tolerance Patient tolerated treatment well;Patient limited by fatigue   ? Behavior During Therapy [facility] for tasks assessed/performed   ? ?  ?  ? ?  ? ? ?Past Medical History:  ?Diagnosis Date  ? Blind   ? 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 Deafness 9 /24/1956  ? bilateral  ? ? ?Past Surgical History:  ?Procedure Laterality Date  ? ABDOMINAL HYSTERECTOMY  1986  ? nonmalignant reasons  ? COLONOSCOPY WITH PROPOFOL N/A 06/08/2020  ? Procedure: COLONOSCOPY WITH PROPOFOL;  Surgeon: Virgel Manifold, MD;  Location: [facility] ENDOSCOPY;  Service: Endoscopy;  Laterality: N/A;  TACTIILE SIGN LANGUAGE  ? COLONOSCOPY WITH PROPOFOL N/A 06/22/2021  ? Procedure: COLONOSCOPY WITH PROPOFOL;  Surgeon: Lin Landsman, MD;  Location: [facility] ENDOSCOPY;  Service: Gastroenterology;  Laterality: N/A;  [employer] controller Requested (LG) ?*Interpreter to arrive at 9:30am*  ? COSMETIC SURGERY    ? EYE SURGERY    ? Cataract Surgery  ? TONSILECTOMY, ADENOIDECTOMY, BILATERAL MYRINGOTOMY AND TUBES Bilateral 1968  ? TONSILLECTOMY    ? TUBAL LIGATION    ? ? ?There were no vitals filed for this visit. ? ? Subjective Assessment - 07/17/21 1013   ? ? Subjective Pt states she is doing well today. Reports 2/10 pain in left shoulder, denies pain in right shoulder. Is compliant with HEP.   ? Patient is accompained by: Interpreter   ? Pertinent History  66 y.o. female pnt presenst with chronic bilateral shoulder pain with insidious onset that started last fall. pnt reports pain is at worst 8/10, best 0/10, and currently 4/10 on NPS. pnt describes pain as achy and dull. aggrevating factors include pushing/pulling, sleeping, bed mobility, opening a jar, squeezing shampoo/conditioner bottles, overheard activites, washing her hair, reaching behind her, picking things up, taking her bra off, and cold weather. pain is worse in the morning. easing factors include steroid injections and rest. pnt goal is strengthing and exercise. pnt denys weight change, b/b dysfunction, n/v, fever, night pain, or falls within the last 6 months   ? Limitations Lifting;House hold activities   ? How long can you sit comfortably? unlimited   ? How long can you stand comfortably? unlimited   ? How long can you walk comfortably? unlimited   ? Patient Stated Goals to increase her strength and learn exercises   ? Currently in Pain? Yes   ? Pain Score 3    ? Pain Location Shoulder   ? Pain Orientation Left   ? Pain Descriptors / Indicators Aching   ? ?  ?  ? ?  ? ? ? ? ?  ?**B SHOULDERS** ?  ?  ?INTERVENTIONS ? ?  ?Therex ?Arm ergometer for UE warm-up, 4 minutes forward; ?AAROM shoulder flexion using SPC, supine 2x10; ?**increased ROM during AA;  brief hold at end range ?UT stretch, 2x30 seconds each side; ?  ?Manual Therapy  ?PROM into flexion, scaption, abduction and D1/D2 diagonals  LUE ?GH mobilizations - A-P and sup-inf through various ranges of motion to increase abduction and flexion range; ?STM to left upper trap and lateral/anterior deltoid utilizing effleurage, p?trissage and TP release; ? -very sensitive near [facility] joint, shaft and lateral clavicle and lateral shoulder soft tissue ?  ?  ?Clinical Impression: Pt is pleasant and motivated throughout session. She communicates pain/discomfort well through facial expressions during manual therapy. PT was able to increase to grade 2-3 mobs on  left shoulder. Pt grimacing during PROM however was able to perform increased ROM during active assist using cane. STM discovered increased sensitivity and tenderness to clavicle, AC joint and lateral/anterior deltoid. Left shoulder remains more limited in range and pain than right shoulder. Pt would benefit from continued PT to improve ROM and decrease pain during functional activities at home and while signing with her significant other.   ? ? ? ? ? ? ? ? PT Short Term Goals - 06/14/21 1338   ? ?  ? PT SHORT TERM GOAL #1  ? Title Pnt will be independent with HEP in order to progress rehab at home in order to acheive ADLs.   ? Time 4   ? Period Weeks   ? Status New   ? Target Date 07/12/21   ?  ? PT SHORT TERM GOAL #2  ? Title Pnt will decrease max NPS to 6/10 in order to achieve functional ADLs.   ? Baseline 06/14/21 8/10   ? Time 4   ? Period Weeks   ? Status New   ? Target Date 07/12/21   ? ?  ?  ? ?  ? ? ? ? PT Long Term Goals - 06/14/21 1340   ? ?  ? PT LONG TERM GOAL #1  ? Title Pnt will increase functional ER to T3 and IR to T7 in order to dress and put bra on/off independently.   ? Baseline 2/9/23IR L: c3 R: c5  ER:  L: psis, R: psis   ? Time 8   ? Period Weeks   ? Status New   ? Target Date 08/09/21   ?  ? PT LONG TERM GOAL #2  ? Title pnt will increase bilateral gross shoulder MMT to 4/5 in order to achieve functional ADLs.   ? Baseline 06/14/21 Flexion: R: 3+/5. L: 3+/5  Abduction: R: 3+/5. L: 3+/5  ER: R: 4/5. L: 3/5  IR: R: 5/5. L: 5/5.   ? Time 8   ? Period Weeks   ? Status New   ? Target Date 08/09/21   ?  ? PT LONG TERM GOAL #3  ? Title pnt will increase bilateral shoulder AROM flexion/abduction to 170 in order to complete overhead ADLs   ? Baseline 06/14/21 Flexion: R:150. L:140  Abduction: R: 152. L: 105   ? Time 8   ? Period Weeks   ? Status New   ? Target Date 08/09/21   ? ?  ?  ? ?  ? ? ? ? ? ? ? ? Plan - 07/17/21 1811   ? ? Clinical Impression Statement Pt is pleasant and motivated throughout  session. She communicates pain/discomfort well through facial expressions during manual therapy. PT was able to increase to grade 2-3 mobs on left shoulder. Pt grimacing during PROM however was able to perform increased ROM during active  assist using cane. STM discovered increased sensitivity and tenderness to clavicle, AC joint and lateral/anterior deltoid. Left shoulder remains more limited in range and pain than right shoulder. Pt would benefit from continued PT to improve ROM and decrease pain during functional activities at home and while signing with her significant other.   ? Personal Factors and Comorbidities Age;Time since onset of injury/illness/exacerbation;Other   ? Examination-Activity Limitations Bathing;Lift;Reach Overhead;Carry;Dressing;Bed Mobility;Hygiene/Grooming;Sleep   ? Examination-Participation Restrictions Cleaning;Laundry;Community Activity;Occupation;[employer] Work   ? Stability/Clinical Decision Making Evolving/Moderate complexity   ? Rehab Potential Good   ? PT Frequency 2x / week   ? PT Duration 8 weeks   ? PT Treatment/Interventions ADLs/Self Care Home Management;Electrical Stimulation;Iontophoresis '4mg'$ /ml Dexamethasone;Biofeedback;Gait training;Stair training;Functional mobility training;Therapeutic activities;Therapeutic exercise;Balance training;Neuromuscular re-education;Patient/family education;Manual techniques;Passive range of motion;Dry needling;Energy conservation;Joint Manipulations;Moist Heat;Ultrasound;Spinal Manipulations   ? PT Next Visit Plan periscapular MMT, shoulder special tests (impingement), grip strength, progress gentle ROM and shoulder strength program.   ? PT Home Exercise Plan banded ER , AAROM   ? Consulted and Agree with Plan of Care Patient   ? ?  ?  ? ?  ? ? ?Patient will benefit from skilled therapeutic intervention in order to improve the following deficits and impairments:  Decreased endurance, Decreased mobility, Impaired vision/preception, Decreased range  of motion, Improper body mechanics, Pain, Impaired UE functional use, Decreased strength, Decreased activity tolerance, Hypomobility, Increased muscle spasms, Impaired flexibility ? ?Visit Diagnosis: ?Chronic le

## 2021-07-19 ENCOUNTER — Other Ambulatory Visit: Payer: Self-pay

## 2021-07-19 ENCOUNTER — Ambulatory Visit: Payer: PPO | Admitting: Physical Therapy

## 2021-07-19 ENCOUNTER — Encounter: Payer: Self-pay | Admitting: Physical Therapy

## 2021-07-19 DIAGNOSIS — M25512 Pain in left shoulder: Secondary | ICD-10-CM | POA: Diagnosis not present

## 2021-07-19 NOTE — Therapy (Signed)
[facility] ?[address] ?[place], [place], [address] ?Phone: [phone]   Fax:  [phone] ? ?Physical Therapy Treatment ? ?Patient Details  ?Name: Helen Mccoy ?MRN: [id number] ?Date of Birth: 11-Jun-1955 ?No data recorded ? ?Encounter Date: 07/19/2021 ? ? PT End of Session - 07/19/21 1120   ? ? Visit Number 7   ? Number of Visits 17   ? Date for PT Re-Evaluation 08/09/21   ? Progress Note Due on Visit 10   ? PT Start Time 1045   ? PT Stop Time 1125   ? PT Time Calculation (min) 40 min   ? Activity Tolerance Patient tolerated treatment well;Patient limited by fatigue   ? Behavior During Therapy [facility] for tasks assessed/performed   ? ?  ?  ? ?  ? ? ?Past Medical History:  ?Diagnosis Date  ? Blind   ? 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 Deafness 9 /24/1956  ? bilateral  ? ? ?Past Surgical History:  ?Procedure Laterality Date  ? ABDOMINAL HYSTERECTOMY  1986  ? nonmalignant reasons  ? COLONOSCOPY WITH PROPOFOL N/A 06/08/2020  ? Procedure: COLONOSCOPY WITH PROPOFOL;  Surgeon: Virgel Manifold, MD;  Location: [facility] ENDOSCOPY;  Service: Endoscopy;  Laterality: N/A;  TACTIILE SIGN LANGUAGE  ? COLONOSCOPY WITH PROPOFOL N/A 06/22/2021  ? Procedure: COLONOSCOPY WITH PROPOFOL;  Surgeon: Lin Landsman, MD;  Location: [facility] ENDOSCOPY;  Service: Gastroenterology;  Laterality: N/A;  [employer] Requested (LG) ?**Interpreter to arrive at 9:30am**  ? COSMETIC SURGERY    ? EYE SURGERY    ? Cataract Surgery  ? TONSILECTOMY, ADENOIDECTOMY, BILATERAL MYRINGOTOMY AND TUBES Bilateral 1968  ? TONSILLECTOMY    ? TUBAL LIGATION    ? ? ?There were no vitals filed for this visit. ? ? Subjective Assessment - 07/19/21 1044   ? ? Subjective Pt states she is doing well today. Reports 2-3/10 pain in left shoulder, denies pain in right shoulder. Is compliant with HEP. Her friend from [place] was here visiting yesterday.   ?  Patient is accompained by: Interpreter   ? Pertinent History 66 y.o. female pnt presenst with chronic bilateral shoulder pain with insidious onset that started last fall. pnt reports pain is at worst 8/10, best 0/10, and currently 4/10 on NPS. pnt describes pain as achy and dull. aggrevating factors include pushing/pulling, sleeping, bed mobility, opening a jar, squeezing shampoo/conditioner bottles, overheard activites, washing her hair, reaching behind her, picking things up, taking her bra off, and cold weather. pain is worse in the morning. easing factors include steroid injections and rest. pnt goal is strengthing and exercise. pnt denys weight change, b/b dysfunction, n/v, fever, night pain, or falls within the last 6 months   ? Limitations Lifting;House hold activities   ? How long can you sit comfortably? unlimited   ? How long can you stand comfortably? unlimited   ? How long can you walk comfortably? unlimited   ? Patient Stated Goals to increase her strength and learn exercises   ? Currently in Pain? Yes   ? Pain Score 3    ? Pain Location Shoulder   ? ?  ?  ? ?  ? ? ? ?**B SHOULDERS** ?  ?  ?INTERVENTIONS ?  ?  ?Therex ?Arm ergometer for UE warm-up, 4 minutes forward; ?AAROM shoulder flexion using SPC, supine 2x10; ?**increased ROM during AA; brief hold at end range. ?**reported pain in  shoulder following exercise ?UT stretch, 2x30 seconds each side; ?  ?Manual Therapy  ?PROM into flexion, scaption, abduction and D1/D2 diagonals  LUE ?STM to left upper trap and lateral/anterior deltoid utilizing effleurage, p?trissage and TP release; ? -very sensitive near [facility] joint, shaft and lateral clavicle and lateral shoulder soft tissue ?  ?  ?Clinical Impression: Pt is pleasant and motivated throughout session. She communicates pain/discomfort well through facial expressions during manual therapy. The increased soft tissue tenderness that was present last session seems to have regressed back to mostly upper trap.  Pain tolerance to AAROM is improved compared to PROM demonstrated with greater ROM achieved. Pt would benefit from continued PT to improve ROM and decrease pain during functional activities at home and while signing with her significant other.  ? ? ? ? ? ? ? ? ? PT Short Term Goals - 06/14/21 1338   ? ?  ? PT SHORT TERM GOAL #1  ? Title Pnt will be independent with HEP in order to progress rehab at home in order to acheive ADLs.   ? Time 4   ? Period Weeks   ? Status New   ? Target Date 07/12/21   ?  ? PT SHORT TERM GOAL #2  ? Title Pnt will decrease max NPS to 6/10 in order to achieve functional ADLs.   ? Baseline 06/14/21 8/10   ? Time 4   ? Period Weeks   ? Status New   ? Target Date 07/12/21   ? ?  ?  ? ?  ? ? ? ? PT Long Term Goals - 06/14/21 1340   ? ?  ? PT LONG TERM GOAL #1  ? Title Pnt will increase functional ER to T3 and IR to T7 in order to dress and put bra on/off independently.   ? Baseline 2/9/23IR L: c3 R: c5  ER:  L: psis, R: psis   ? Time 8   ? Period Weeks   ? Status New   ? Target Date 08/09/21   ?  ? PT LONG TERM GOAL #2  ? Title pnt will increase bilateral gross shoulder MMT to 4/5 in order to achieve functional ADLs.   ? Baseline 06/14/21 Flexion: R: 3+/5. L: 3+/5  Abduction: R: 3+/5. L: 3+/5  ER: R: 4/5. L: 3/5  IR: R: 5/5. L: 5/5.   ? Time 8   ? Period Weeks   ? Status New   ? Target Date 08/09/21   ?  ? PT LONG TERM GOAL #3  ? Title pnt will increase bilateral shoulder AROM flexion/abduction to 170 in order to complete overhead ADLs   ? Baseline 06/14/21 Flexion: R:150. L:140  Abduction: R: 152. L: 105   ? Time 8   ? Period Weeks   ? Status New   ? Target Date 08/09/21   ? ?  ?  ? ?  ? ? ? ? ? ? ? ? Plan - 07/19/21 1120   ? ? Clinical Impression Statement Pt is pleasant and motivated throughout session. She communicates pain/discomfort well through facial expressions during manual therapy. The increased soft tissue tenderness that was present last session seems to have regressed back to mostly  upper trap. Pain tolerance to AAROM is improved compared to PROM demonstrated with greater ROM achieved. Pt would benefit from continued PT to improve ROM and decrease pain during functional activities at home and while signing with her significant other.   ? Personal Factors and Comorbidities  Age;Time since onset of injury/illness/exacerbation;Other   ? Examination-Activity Limitations Bathing;Lift;Reach Overhead;Carry;Dressing;Bed Mobility;Hygiene/Grooming;Sleep   ? Examination-Participation Restrictions Cleaning;Laundry;Community Activity;Occupation;[employer] Work   ? Stability/Clinical Decision Making Evolving/Moderate complexity   ? Rehab Potential Good   ? PT Frequency 2x / week   ? PT Duration 8 weeks   ? PT Treatment/Interventions ADLs/Self Care Home Management;Electrical Stimulation;Iontophoresis '4mg'$ /ml Dexamethasone;Biofeedback;Gait training;Stair training;Functional mobility training;Therapeutic activities;Therapeutic exercise;Balance training;Neuromuscular re-education;Patient/family education;Manual techniques;Passive range of motion;Dry needling;Energy conservation;Joint Manipulations;Moist Heat;Ultrasound;Spinal Manipulations   ? PT Next Visit Plan periscapular MMT, shoulder special tests (impingement), grip strength, progress gentle ROM and shoulder strength program.   ? PT Home Exercise Plan banded ER , AAROM   ? Consulted and Agree with Plan of Care Patient   ? ?  ?  ? ?  ? ? ?Patient will benefit from skilled therapeutic intervention in order to improve the following deficits and impairments:  Decreased endurance, Decreased mobility, Impaired vision/preception, Decreased range of motion, Improper body mechanics, Pain, Impaired UE functional use, Decreased strength, Decreased activity tolerance, Hypomobility, Increased muscle spasms, Impaired flexibility ? ?Visit Diagnosis: ?Chronic left shoulder pain ? ?Chronic right shoulder pain ? ? ? ? ?Problem List ?Patient Active Problem List  ? Diagnosis Date  Noted  ? Hx of colonic polyps   ? Polyp of descending colon   ? Chronic pain of both shoulders 06/01/2021  ? Arthritis of carpometacarpal [facility]) joint of right thumb 06/01/2021  ? Encounter for screening colonos

## 2021-07-24 ENCOUNTER — Ambulatory Visit: Payer: PPO

## 2021-07-24 ENCOUNTER — Other Ambulatory Visit: Payer: Self-pay

## 2021-07-24 DIAGNOSIS — R252 Cramp and spasm: Secondary | ICD-10-CM

## 2021-07-24 DIAGNOSIS — G8929 Other chronic pain: Secondary | ICD-10-CM

## 2021-07-24 DIAGNOSIS — M25512 Pain in left shoulder: Secondary | ICD-10-CM | POA: Diagnosis not present

## 2021-07-24 NOTE — Therapy (Signed)
[place] ?[facility] ?[address] ?[place], [place], [address] ?Phone: [phone]   Fax:  [phone] ? ?Physical Therapy Treatment ? ?Patient Details  ?Name: Helen Mccoy ?MRN: [id number] ?Date of Birth: 04-12-1956 ?No data recorded ? ?Encounter Date: 07/24/2021 ? ? PT End of Session - 07/24/21 1027   ? ? Visit Number 8   ? Number of Visits 17   ? Date for PT Re-Evaluation 08/12/21   ? Authorization Type [employer] M[employer]   ? Authorization Time Period 06/15/21-08/12/21   ? Progress Note Due on Visit 10   ? PT Start Time 1005   ? PT Stop Time 3154   ? PT Time Calculation (min) 35 min   ? Activity Tolerance Patient tolerated treatment well;Patient limited by fatigue   ? Behavior During Therapy [facility] for tasks assessed/performed   ? ?  ?  ? ?  ? ? ?Past Medical History:  ?Diagnosis Date  ? Blind   ? 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 Deafness 9 /24/1956  ? bilateral  ? ? ?Past Surgical History:  ?Procedure Laterality Date  ? ABDOMINAL HYSTERECTOMY  1986  ? nonmalignant reasons  ? COLONOSCOPY WITH PROPOFOL N/A 06/08/2020  ? Procedure: COLONOSCOPY WITH PROPOFOL;  Surgeon: Virgel Manifold, MD;  Location: [facility] ENDOSCOPY;  Service: Endoscopy;  Laterality: N/A;  TACTIILE SIGN LANGUAGE  ? COLONOSCOPY WITH PROPOFOL N/A 06/22/2021  ? Procedure: COLONOSCOPY WITH PROPOFOL;  Surgeon: Lin Landsman, MD;  Location: [facility] ENDOSCOPY;  Service: Gastroenterology;  Laterality: N/A;  [employer] controller Requested (LG) ?Interpreter to arrive at 9:30am  ? [place]    ? EYE SURGERY    ? Cataract Surgery  ? TONSILECTOMY, ADENOIDECTOMY, BILATERAL MYRINGOTOMY AND TUBES Bilateral 1968  ? TONSILLECTOMY    ? TUBAL LIGATION    ? ? ?There were no vitals filed for this visit. ? ? Subjective Assessment - 07/24/21 1025   ? ? Subjective Pt doing well today, still working with bands and wand for flexion at home. No other updates   ?  Pertinent History 66 y.o. female pnt presenst with chronic bilateral shoulder pain with insidious onset that started last fall. pnt reports pain is at worst 8/10, best 0/10, and currently 4/10 on NPS. pnt describes pain as achy and dull. aggrevating factors include pushing/pulling, sleeping, bed mobility, opening a jar, squeezing shampoo/conditioner bottles, overheard activites, washing her hair, reaching behind her, picking things up, taking her bra off, and cold weather. pain is worse in the morning. easing factors include steroid injections and rest. pnt goal is strengthing and exercise. pnt denys weight change, b/b dysfunction, n/v, fever, night pain, or falls within the last 6 months   ? Currently in Pain? Yes   ? Pain Score 2    ? Pain Location --   left shoulder  (5/10 when using for language)  ? ?  ?  ? ?  ? ? ?INTERVENTION: ?*session facilitated by protactile interpreter Amy, accompanied by intern Larene Beach ?Attempted FOTO survery which was difficult to complete due to irrelevance of most questions per pt. Questions did not capture pt's biggest limitations and complaints.  ?-LUE ABDCT 1x15 ?-LUE flexion 1x15  ?-LUE GHJER in supported abdcution 1x15 @ 3lb ?-LUE ABDCT 1x15 ?-LUE flexion 1x15  ?-LUE GHJER in supported abdcution 1x15 @ 4lb ? ? PT Education - 07/24/21 1031   ? ? Education Details exercise modification with symptoms   ?  Person(s) Educated Patient   ? Methods Explanation;Demonstration   ? Comprehension Verbalized understanding   ? ?  ?  ? ?  ? ? ? PT Short Term Goals - 06/14/21 1338   ? ?  ? PT SHORT TERM GOAL #1  ? Title Pnt will be independent with HEP in order to progress rehab at home in order to acheive ADLs.   ? Time 4   ? Period Weeks   ? Status New   ? Target Date 07/12/21   ?  ? PT SHORT TERM GOAL #2  ? Title Pnt will decrease max NPS to 6/10 in order to achieve functional ADLs.   ? Baseline 06/14/21 8/10   ? Time 4   ? Period Weeks   ? Status New   ? Target Date 07/12/21   ? ?  ?  ? ?   ? ? ? ? PT Long Term Goals - 06/14/21 1340   ? ?  ? PT LONG TERM GOAL #1  ? Title Pnt will increase functional ER to T3 and IR to T7 in order to dress and put bra on/off independently.   ? Baseline 2/9/23IR L: c3 R: c5  ER:  L: psis, R: psis   ? Time 8   ? Period Weeks   ? Status New   ? Target Date 08/09/21   ?  ? PT LONG TERM GOAL #2  ? Title pnt will increase bilateral gross shoulder MMT to 4/5 in order to achieve functional ADLs.   ? Baseline 06/14/21 Flexion: R: 3+/5. L: 3+/5  Abduction: R: 3+/5. L: 3+/5  ER: R: 4/5. L: 3/5  IR: R: 5/5. L: 5/5.   ? Time 8   ? Period Weeks   ? Status New   ? Target Date 08/09/21   ?  ? PT LONG TERM GOAL #3  ? Title pnt will increase bilateral shoulder AROM flexion/abduction to 170 in order to complete overhead ADLs   ? Baseline 06/14/21 Flexion: R:150. L:140  Abduction: R: 152. L: 105   ? Time 8   ? Period Weeks   ? Status New   ? Target Date 08/09/21   ? ?  ?  ? ?  ? ? ? ? ? ? ? ? Plan - 07/24/21 1031   ? ? Clinical Impression Statement Continued with basic shoulder strengthening in pain free range. Attempted FOTO survery which was aborted after it was clear that none of the questions were of meaningful importance to the patient. WIll seek out another survery at future viists. Pt reports still has pain when communicating with significant other who is much taller. Pt desires to conitnue to maximize strength with any and all movements of arm to facilitate ability to use BUE for protactile and ASL communication, particularly when conversing with taller individuals. Pt reports she will likley opt to self DC after last scheduled visit to take a break from therapy due to burden of finance and arranging transport for her schedule.   ? Personal Factors and Comorbidities Age;Time since onset of injury/illness/exacerbation;Other   ? Examination-Activity Limitations Bathing;Lift;Reach Overhead;Carry;Dressing;Bed Mobility;Hygiene/Grooming;Sleep   ? Examination-Participation Restrictions  Cleaning;Laundry;Community Activity;Occupation;[employer] Work   ? Stability/Clinical Decision Making Evolving/Moderate complexity   ? Clinical Decision Making Moderate   ? Rehab Potential Good   ? PT Frequency 2x / week   ? PT Duration 8 weeks   ? PT Treatment/Interventions ADLs/Self Care Home Management;Electrical Stimulation;Iontophoresis '4mg'$ /ml Dexamethasone;Biofeedback;Gait training;Stair training;Functional mobility training;Therapeutic activities;Therapeutic exercise;Balance training;Neuromuscular  re-education;Patient/family education;Manual techniques;Passive range of motion;Dry needling;Energy conservation;Joint Manipulations;Moist Heat;Ultrasound;Spinal Manipulations   ? PT Next Visit Plan Progress strengthening, update HEP as able   ? PT Home Exercise Plan banded ER , AAROM   ? Consulted and Agree with Plan of Care Patient   ? ?  ?  ? ?  ? ? ?Patient will benefit from skilled therapeutic intervention in order to improve the following deficits and impairments:  Decreased endurance, Decreased mobility, Impaired vision/preception, Decreased range of motion, Improper body mechanics, Pain, Impaired UE functional use, Decreased strength, Decreased activity tolerance, Hypomobility, Increased muscle spasms, Impaired flexibility ? ?Visit Diagnosis: ?Chronic left shoulder pain ? ?Chronic right shoulder pain ? ?Cramp and spasm ? ? ? ? ?Problem List ?Patient Active Problem List  ? Diagnosis Date Noted  ? Hx of colonic polyps   ? Polyp of descending colon   ? Chronic pain of both shoulders 06/01/2021  ? Arthritis of carpometacarpal [facility]) joint of right thumb 06/01/2021  ? Encounter for screening colonoscopy   ? Cecal polyp   ? Polyp of ascending colon   ? Polyp of hepatic flexure of colon   ? Chronic left hip pain 05/09/2019  ? Venous stasis dermatitis of both lower extremities 11/04/2018  ? Pruritic erythematous rash 10/24/2018  ? Hyperlipidemia 05/03/2018  ? Bilateral hand numbness 05/01/2016  ? Welcome to Medicare  preventive visit 04/30/2015  ? S/P TAH (total abdominal hysterectomy) 04/28/2015  ? 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 Overweight (BMI 25.0-29.9) 02/01/2013  ? Constipation, slow transit 02/01/2013  ? ?5:43 PM, 07/24/21 ?All

## 2021-07-26 ENCOUNTER — Other Ambulatory Visit: Payer: Self-pay

## 2021-07-26 ENCOUNTER — Ambulatory Visit: Payer: PPO

## 2021-07-26 DIAGNOSIS — R252 Cramp and spasm: Secondary | ICD-10-CM

## 2021-07-26 DIAGNOSIS — G8929 Other chronic pain: Secondary | ICD-10-CM

## 2021-07-26 DIAGNOSIS — M25512 Pain in left shoulder: Secondary | ICD-10-CM | POA: Diagnosis not present

## 2021-07-26 NOTE — Therapy (Signed)
[place] [facility] ?[address] ?[place], [place], [address] ?Phone: [phone]   Fax:  [phone] ? ?Physical Therapy Treatment ? ?Patient Details  ?Name: Helen Mccoy ?MRN: [id number] ?Date of Birth: 08/12/1955 ?No data recorded ? ?Encounter Date: 07/26/2021 ? ? PT End of Session - 07/26/21 1007   ? ? Visit Number 9   ? Number of Visits 17   ? Date for PT Re-Evaluation 08/12/21   ? Authorization Type [employer] M[employer]   ? Authorization Time Period 06/15/21-08/12/21   ? Progress Note Due on Visit 10   ? PT Start Time 1000   ? PT Stop Time 8588   ? PT Time Calculation (min) 40 min   ? ?  ?  ? ?  ? ? ?Past Medical History:  ?Diagnosis Date  ? Blind   ? 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 Deafness 9 /24/1956  ? bilateral  ? ? ?Past Surgical History:  ?Procedure Laterality Date  ? ABDOMINAL HYSTERECTOMY  1986  ? nonmalignant reasons  ? COLONOSCOPY WITH PROPOFOL N/A 06/08/2020  ? Procedure: COLONOSCOPY WITH PROPOFOL;  Surgeon: Virgel Manifold, MD;  Location: [facility] ENDOSCOPY;  Service: Endoscopy;  Laterality: N/A;  TACTIILE SIGN LANGUAGE  ? COLONOSCOPY WITH PROPOFOL N/A 06/22/2021  ? Procedure: COLONOSCOPY WITH PROPOFOL;  Surgeon: Lin Landsman, MD;  Location: [facility] ENDOSCOPY;  Service: Gastroenterology;  Laterality: N/A;  [employer] Interpreter Tactile Requested (LG) ?nterpreter to arrive at 9:30am  ? COSMETIC SURGERY    ? EYE SURGERY    ? Cataract Surgery  ? TONSILECTOMY, ADENOIDECTOMY, BILATERAL MYRINGOTOMY AND TUBES Bilateral 1968  ? TONSILLECTOMY    ? TUBAL LIGATION    ? ? ?There were no vitals filed for this visit. ? ? Subjective Assessment - 07/26/21 1004   ? ? Subjective Pt reports pain with some specific exercises on the LUE at home, horizontal ABDCT reaching. Also used left arm to reach into fridge without issue. Today her left shoulder pain is 1/10.   ? Pertinent History 66 y.o. female pnt presenst with  chronic bilateral shoulder pain with insidious onset that started last fall. pnt reports pain is at worst 8/10, best 0/10, and currently 4/10 on NPS. pnt describes pain as achy and dull. aggrevating factors include pushing/pulling, sleeping, bed mobility, opening a jar, squeezing shampoo/conditioner bottles, overheard activites, washing her hair, reaching behind her, picking things up, taking her bra off, and cold weather. pain is worse in the morning. easing factors include steroid injections and rest. pnt goal is strengthing and exercise. pnt denys weight change, b/b dysfunction, n/v, fever, night pain, or falls within the last 6 months   ? ?  ?  ? ?  ? ? ? ? ?INTERVENTION ?-LUE ABDCT 1x15, 0 lbs  ?-LUE elbow flexion 1x15 @ 3lb (stops at 10 due to worse pain   ?-LUE flexion 1x15to 90 degrees, light  ?-LUE GHJ ER in neutral flexion, elbow supported 3lb weight 1x15 ?-standing cable row 1x15 @ 5lb  ? ? ?-LUE ABDCT 1x15, 0 lbs  ?-LUE elbow flexion 1x10 @ 1lb at 10 due to worse pain  (position is more painful here and more biceps distribution pain rather than shoulder pain)  ?-LUE flexion 1x15to 90 degrees ?-LUE GHJ ER in neutral flexion, elbow supported 2lb weight 1x15 (encouraged pain free range) ?-standing cable row 1x15 @ 5lb  ? ? ? ? ? ? PT Education - 07/26/21 1008   ? ?  Education Details asked to DC 1 exercise at home that was mor eprovocative than helpful   ? Person(s) Educated Patient   ? Methods Explanation   ? Comprehension Verbalized understanding   ? ?  ?  ? ?  ? ? ? PT Short Term Goals - 06/14/21 1338   ? ?  ? PT SHORT TERM GOAL #1  ? Title Pnt will be independent with HEP in order to progress rehab at home in order to acheive ADLs.   ? Time 4   ? Period Weeks   ? Status New   ? Target Date 07/12/21   ?  ? PT SHORT TERM GOAL #2  ? Title Pnt will decrease max NPS to 6/10 in order to achieve functional ADLs.   ? Baseline 06/14/21 8/10   ? Time 4   ? Period Weeks   ? Status New   ? Target Date 07/12/21   ? ?   ?  ? ?  ? ? ? ? PT Long Term Goals - 06/14/21 1340   ? ?  ? PT LONG TERM GOAL #1  ? Title Pnt will increase functional ER to T3 and IR to T7 in order to dress and put bra on/off independently.   ? Baseline 2/9/23IR L: c3 R: c5  ER:  L: psis, R: psis   ? Time 8   ? Period Weeks   ? Status New   ? Target Date 08/09/21   ?  ? PT LONG TERM GOAL #2  ? Title pnt will increase bilateral gross shoulder MMT to 4/5 in order to achieve functional ADLs.   ? Baseline 06/14/21 Flexion: R: 3+/5. L: 3+/5  Abduction: R: 3+/5. L: 3+/5  ER: R: 4/5. L: 3/5  IR: R: 5/5. L: 5/5.   ? Time 8   ? Period Weeks   ? Status New   ? Target Date 08/09/21   ?  ? PT LONG TERM GOAL #3  ? Title pnt will increase bilateral shoulder AROM flexion/abduction to 170 in order to complete overhead ADLs   ? Baseline 06/14/21 Flexion: R:150. L:140  Abduction: R: 152. L: 105   ? Time 8   ? Period Weeks   ? Status New   ? Target Date 08/09/21   ? ?  ?  ? ?  ? ? ? ? ? ? ? ? Plan - 07/26/21 1009   ? ? Clinical Impression Statement Still working on strength building in an open joint and pain-free range. Session facilitated by interpreter Jamal Collin from [facility]. Pt is given tactile and language cues to facilitate range and movement form throughout. At EOS pt reports 2/10 pain, fatigue in shoulder from exercise. Pt progressing well toward goals in general.   ? Personal Factors and Comorbidities Age;Time since onset of injury/illness/exacerbation;Other   ? Examination-Activity Limitations Bathing;Lift;Reach Overhead;Carry;Dressing;Bed Mobility;Hygiene/Grooming;Sleep   ? Examination-Participation Restrictions Cleaning;Laundry;Community Activity;Occupation;[employer] Work   ? Stability/Clinical Decision Making Evolving/Moderate complexity   ? Clinical Decision Making Moderate   ? Rehab Potential Good   ? PT Frequency 2x / week   ? PT Duration 8 weeks   ? PT Treatment/Interventions ADLs/Self Care Home Management;Electrical Stimulation;Iontophoresis '4mg'$ /ml  Dexamethasone;Biofeedback;Gait training;Stair training;Functional mobility training;Therapeutic activities;Therapeutic exercise;Balance training;Neuromuscular re-education;Patient/family education;Manual techniques;Passive range of motion;Dry needling;Energy conservation;Joint Manipulations;Moist Heat;Ultrasound;Spinal Manipulations   ? PT Next Visit Plan Progress strengthening, update HEP as able   ? PT Home Exercise Plan banded ER , AAROM   ? Consulted and Agree with Plan of Care  Patient   ? ?  ?  ? ?  ? ? ?Patient will benefit from skilled therapeutic intervention in order to improve the following deficits and impairments:  Decreased endurance, Decreased mobility, Impaired vision/preception, Decreased range of motion, Improper body mechanics, Pain, Impaired UE functional use, Decreased strength, Decreased activity tolerance, Hypomobility, Increased muscle spasms, Impaired flexibility ? ?Visit Diagnosis: ?Chronic left shoulder pain ? ?Chronic right shoulder pain ? ?Cramp and spasm ? ? ? ? ?Problem List ?Patient Active Problem List  ? Diagnosis Date Noted  ? Hx of colonic polyps   ? Polyp of descending colon   ? Chronic pain of both shoulders 06/01/2021  ? Arthritis of carpometacarpal [facility]) joint of right thumb 06/01/2021  ? Encounter for screening colonoscopy   ? Cecal polyp   ? Polyp of ascending colon   ? Polyp of hepatic flexure of colon   ? Chronic left hip pain 05/09/2019  ? Venous stasis dermatitis of both lower extremities 11/04/2018  ? Pruritic erythematous rash 10/24/2018  ? Hyperlipidemia 05/03/2018  ? Bilateral hand numbness 05/01/2016  ? Welcome to Medicare preventive visit 04/30/2015  ? S/P TAH (total abdominal hysterectomy) 04/28/2015  ? 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 Overweight (BMI 25.0-29.9) 02/01/2013  ? Constipation, slow transit 02/01/2013  ? ? ?Nikan Ellingson C, PT ?07/26/2021, 10:22 AM ? ?[place] [facility] ?[address] ?[place],  [place], [address] ?Phone: [phone]   Fax:  [phone] ? ?Name: EVEREST HACKING ?MRN: [id number] ?Date of Birth: 1956/01/20 ? ? ? ?

## 2021-07-31 ENCOUNTER — Ambulatory Visit: Payer: PPO

## 2021-07-31 ENCOUNTER — Other Ambulatory Visit: Payer: Self-pay

## 2021-07-31 DIAGNOSIS — R252 Cramp and spasm: Secondary | ICD-10-CM

## 2021-07-31 DIAGNOSIS — G8929 Other chronic pain: Secondary | ICD-10-CM

## 2021-07-31 DIAGNOSIS — M25512 Pain in left shoulder: Secondary | ICD-10-CM | POA: Diagnosis not present

## 2021-07-31 NOTE — Therapy (Signed)
[place] [facility] ?[address] ?[place], [place], [address] ?Phone: [phone]   Fax:  [phone] ? ?Physical Therapy Treatment ?Physical Therapy Progress Note ? ? ?Dates of reporting period  06/15/21   to   07/31/21 ? ? ? ?Patient Details  ?Name: Helen Mccoy ?MRN: [id number] ?Date of Birth: 1955-09-02 ?No data recorded ? ?Encounter Date: 07/31/2021 ? ? PT End of Session - 07/31/21 1016   ? ? Visit Number 10   ? Number of Visits 17   ? Date for PT Re-Evaluation 08/12/21   ? Authorization Type [employer] M[employer]   ? Authorization Time Period 06/15/21-08/12/21   ? Progress Note Due on Visit 20   ? PT Start Time 1008   checked in late  ? PT Stop Time 8756   ? PT Time Calculation (min) 32 min   ? Activity Tolerance Patient tolerated treatment well;No increased pain;Patient limited by pain;Patient limited by fatigue   ? Behavior During Therapy [facility] for tasks assessed/performed   ? ?  ?  ? ?  ? ? ?Past Medical History:  ?Diagnosis Date  ? Blind   ? 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 Deafness 9 /24/1956  ? bilateral  ? ? ?Past Surgical History:  ?Procedure Laterality Date  ? ABDOMINAL HYSTERECTOMY  1986  ? nonmalignant reasons  ? COLONOSCOPY WITH PROPOFOL N/A 06/08/2020  ? Procedure: COLONOSCOPY WITH PROPOFOL;  Surgeon: Virgel Manifold, MD;  Location: [facility] ENDOSCOPY;  Service: Endoscopy;  Laterality: N/A;  TACTIILE SIGN LANGUAGE  ? COLONOSCOPY WITH PROPOFOL N/A 06/22/2021  ? Procedure: COLONOSCOPY WITH PROPOFOL;  Surgeon: Lin Landsman, MD;  Location: [facility] ENDOSCOPY;  Service: Gastroenterology;  Laterality: N/A;  American Sign Language Interpreter Protactile Requested (LG) Interpreter to arrive at 9:30am  ? COSMETIC SURGERY    ? EYE SURGERY    ? Cataract Surgery  ? TONSILECTOMY, ADENOIDECTOMY, BILATERAL MYRINGOTOMY AND TUBES Bilateral 1968  ? TONSILLECTOMY    ? TUBAL LIGATION    ? ? ?There were no vitals filed for this visit. ? ?  Subjective Assessment - 07/31/21 1009   ? ? Subjective Pt doing well today, felt good after last session. Pt fell over weekend after leaving dishwasher open and landed on 2 outstetched hands on open dishwasher door   ? Pertinent History 66 y.o. female pnt presenst with chronic bilateral shoulder pain with insidious onset that started last fall. pnt reports pain is at worst 8/10, best 0/10, and currently 4/10 on NPS. pnt describes pain as achy and dull. aggrevating factors include pushing/pulling, sleeping, bed mobility, opening a jar, squeezing shampoo/conditioner bottles, overheard activites, washing her hair, reaching behind her, picking things up, taking her bra off, and cold weather. pain is worse in the morning. easing factors include steroid injections and rest. pnt goal is strengthing and exercise. pnt denys weight change, b/b dysfunction, n/v, fever, night pain, or falls within the last 6 months   ? Currently in Pain? No/denies   ? Pain Score 1    minimal to none in left shoulder  ? ?  ?  ? ?  ? ?ROM reassessed:  ?Left shoulder flexion 154 ?Lt ER: ends of digit 1 and 2 able just shy of C7 level  ? ?INTERVENTION 3/28 ?-LUE flexion 1x12 90 degrees ?-LUE elbow flexion 1x12 @ 2lb  ?-LUE ABDCT 1x12, 0 lbs  ? ?-LUE flexion 1x12 90 degrees ?-LUE elbow flexion 1x12 @ 2lb  ?-LUE  ABDCT 1x12, 0 lbs  ? ?-seated LUE GHJ ER, elbow supports at 60 degrees ABDCT  ?-standing cable row 1x10 @ 10lbs (increased today )  ?-seated Left shoulder isometric adduction green ball 12x3secH  ? ?-seated LUE GHJ ER, elbow supports at 60 degrees ABDCT  ?-standing cable row 1x10 @ 10lbs (increased today )  ?-seated Left shoulder isometric adduction green ball 12x3secH  ? ?-BUE IR isometrics crushing the gray bolster cylinder 12x3secH (axillary towel left to open joint space.  ? ? PT Education - 07/31/21 1021   ? ? Education Details appropriate range for exercises   ? Person(s) Educated Patient   ? Methods Explanation   ? Comprehension  Verbalized understanding   ? ?  ?  ? ?  ? ? ? PT Short Term Goals - 07/31/21 1044   ? ?  ? PT SHORT TERM GOAL #1  ? Title Pnt will be independent with HEP in order to progress rehab at home in order to acheive ADLs.   ? Time 4   ? Period Weeks   ? Status Achieved   ? Target Date 07/12/21   ?  ? PT SHORT TERM GOAL #2  ? Title Pnt will decrease max NPS to 6/10 in order to achieve functional ADLs.   ? Baseline 06/14/21 8/10   ? Time 4   ? Period Weeks   ? Status Achieved   ? Target Date 07/12/21   ? ?  ?  ? ?  ? ? ? ? PT Long Term Goals - 07/31/21 1044   ? ?  ? PT LONG TERM GOAL #1  ? Title Pnt will increase functional ER to T3 and IR to T7 in order to dress and put bra on/off independently.   ? Baseline 2/9/23IR L: c3 R: c5  ER:  L: psis, R: psis; 3/28: unchanged largely, pain prohibited   ? Time 8   ? Period Weeks   ? Status On-going   ? Target Date 08/09/21   ?  ? PT LONG TERM GOAL #2  ? Title pnt will increase bilateral gross shoulder MMT to 4/5 in order to achieve functional ADLs.   ? Baseline 06/14/21 Flexion: R: 3+/5. L: 3+/5  Abduction: R: 3+/5. L: 3+/5  ER: R: 4/5. L: 3/5  IR: R: 5/5. L: 5/5; 3/28: painful with resistance in all , no appropriate for testing   ? Time 8   ? Period Weeks   ? Status On-going   ? Target Date 08/09/21   ?  ? PT LONG TERM GOAL #3  ? Title pnt will increase bilateral shoulder AROM flexion/abduction to 170 in order to complete overhead ADLs   ? Baseline 06/14/21 Flexion: R:150. L:140  Abduction: R: 152. L: 105; 07/31/21: Left ABDCT 134, Left Flexion 154   ? Time 8   ? Period Weeks   ? Status On-going   ? Target Date 08/09/21   ? ?  ?  ? ?  ? ? ? ? ? ? ? ? Plan - 07/31/21 1021   ? ? Clinical Impression Statement Continued with general shoulder strengthening, widespread global weakness with pain. Author able to adjust intensity and reps to minimize symptoms aggravation. ROM assessment is consistent with prior assessments, pt has most of her needed functional range with end range pain, except  functional ER remains more limited and most painful. Pt reports arm is very fatigued at end of session. Interpretation for Protactile throughout [place] at [facility]. Plan remains to  finish out Month of March PT visits, then self DC to take a financial and scheduling respite.   ? Personal Factors and Comorbidities Age;Time since onset of injury/illness/exacerbation;Other   ? Examination-Activity Limitations Bathing;Lift;Reach Overhead;Carry;Dressing;Bed Mobility;Hygiene/Grooming;Sleep   ? Examination-Participation Restrictions Cleaning;Laundry;Community Activity;Occupation;[employer] Work   ? Stability/Clinical Decision Making Evolving/Moderate complexity   ? Clinical Decision Making Moderate   ? Rehab Potential Good   ? PT Frequency 2x / week   ? PT Duration 8 weeks   ? PT Treatment/Interventions ADLs/Self Care Home Management;Electrical Stimulation;Iontophoresis '4mg'$ /ml Dexamethasone;Biofeedback;Gait training;Stair training;Functional mobility training;Therapeutic activities;Therapeutic exercise;Balance training;Neuromuscular re-education;Patient/family education;Manual techniques;Passive range of motion;Dry needling;Energy conservation;Joint Manipulations;Moist Heat;Ultrasound;Spinal Manipulations   ? PT Next Visit Plan Progress strengthening, update HEP as able   ? PT Home Exercise Plan banded ER, AAROM   ? Consulted and Agree with Plan of Care Patient   ? ?  ?  ? ?  ? ? ?Patient will benefit from skilled therapeutic intervention in order to improve the following deficits and impairments:  Decreased endurance, Decreased mobility, Impaired vision/preception, Decreased range of motion, Improper body mechanics, Pain, Impaired UE functional use, Decreased strength, Decreased activity tolerance, Hypomobility, Increased muscle spasms, Impaired flexibility ? ?Visit Diagnosis: ?Chronic left shoulder pain ? ?Chronic right shoulder pain ? ?Cramp and spasm ? ? ? ? ?Problem List ?Patient Active Problem List  ? Diagnosis Date Noted  ?  Hx of colonic polyps   ? Polyp of descending colon   ? Chronic pain of both shoulders 06/01/2021  ? Arthritis of carpometacarpal [facility]) joint of right thumb 06/01/2021  ? Encounter for screening colonoscopy   ?

## 2021-08-02 ENCOUNTER — Ambulatory Visit: Payer: PPO

## 2021-08-02 DIAGNOSIS — R252 Cramp and spasm: Secondary | ICD-10-CM

## 2021-08-02 DIAGNOSIS — M25512 Pain in left shoulder: Secondary | ICD-10-CM | POA: Diagnosis not present

## 2021-08-02 DIAGNOSIS — G8929 Other chronic pain: Secondary | ICD-10-CM

## 2021-08-02 NOTE — Therapy (Signed)
[place] [facility] ?[address] ?[place], [place], [address] ?Phone: [phone]   Fax:  [phone] ? ?Physical Therapy Treatment/DC summary  ? ?Patient Details  ?Name: Helen Mccoy ?MRN: [id number] ?Date of Birth: 02-19-1956 ?No data recorded ? ?Encounter Date: 08/02/2021 ? ? PT End of Session - 08/02/21 1017   ? ? Visit Number 11   ? Number of Visits 17   ? Date for PT Re-Evaluation 08/12/21   ? Authorization Type [employer] M[employer]   ? Authorization Time Period 06/15/21-08/12/21   ? Progress Note Due on Visit 20   ? PT Start Time [phone]   ? PT Stop Time 5638   ? PT Time Calculation (min) 42 min   ? Activity Tolerance Patient tolerated treatment well;No increased pain;Patient limited by pain;Patient limited by fatigue   ? Behavior During Therapy [facility] for tasks assessed/performed   ? ?  ?  ? ?  ? ? ?Past Medical History:  ?Diagnosis Date  ? Blind   ? 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 Deafness 9 /24/1956  ? bilateral  ? ? ?Past Surgical History:  ?Procedure Laterality Date  ? ABDOMINAL HYSTERECTOMY  1986  ? nonmalignant reasons  ? COLONOSCOPY WITH PROPOFOL N/A 06/08/2020  ? Procedure: COLONOSCOPY WITH PROPOFOL;  Surgeon: Virgel Manifold, MD;  Location: [facility] ENDOSCOPY;  Service: Endoscopy;  Laterality: N/A;  TACTIILE SIGN LANGUAGE  ? COLONOSCOPY WITH PROPOFOL N/A 06/22/2021  ? Procedure: COLONOSCOPY WITH PROPOFOL;  Surgeon: Lin Landsman, MD;  Location: [facility] ENDOSCOPY;  Service: Gastroenterology;  Laterality: N/A;  American Sign Language Interpreter Protactile Requested (LG) Interpreter to arrive at 9:30am  ? COSMETIC SURGERY    ? EYE SURGERY    ? Cataract Surgery  ? TONSILECTOMY, ADENOIDECTOMY, BILATERAL MYRINGOTOMY AND TUBES Bilateral 1968  ? TONSILLECTOMY    ? TUBAL LIGATION    ? ? ?There were no vitals filed for this visit. ? ? Subjective Assessment - 08/02/21 1004   ? ? Subjective Pt doing well today, says she felt good  after last session. She has no pain on arrival, but conitnues to report horizontal ABDCT with and without ER as an aggravating factor to anterior shoulder pain.   ? Pertinent History 66yoF who is referred to OPPT for acute on chronic left shoulder pain. Hx includes bilat shoulder pain, seen for both in PT in past. Pt is blind and dead, uses Protactile to communicate, a haptic language that incorporates parts of ASL. Pt?s SO is much taller than her and communicating hand to hand puts her shoulders in a range of aggravation. Pt has pain with most ADL/IADL movements that involve arm over 90 degrees flexion or >90 degrees horizontal abduction.   ? Patient Stated Goals to increase her strength and learn exercises   ? Currently in Pain? No/denies   ? ?  ?  ? ?  ? ? ?Shoulder ROM Assessment 07/31/21   ? Right  Left   ?Flexion A/ROM  154  ?ABDCT A/ROM   125  ?External rotation  C7  ?Internal Rotation     ?    ?    ?    ?    ?    ? ?INTERVENTION this date ?-LUE flexion 1x12 90 degrees ?-LUE elbow flexion 1x12 @ 2lb  ?-LUE ABDCT 1x12, 0 lbs  ?-BUE wall pushup on treadmill bar 1x10, toes 24? back ? ?-LUE flexion 1x12 90 degrees ?-LUE elbow flexion  1x12 @ 2lb  ?-LUE ABDCT 1x12, 0 lbs  ?-BUE wall pushup on treadmill bar 1x10, toes 24? back ? ?-seated LUE GHJ ER, elbow supports at 60 degrees ABDCT  ?-standing cable row 1x10 @ 10lbs (increased today)  ?-seated Left shoulder isometric adduction towel roll 12x3secH  ? ?-seated LUE GHJ ER, elbow supports at 60 degrees ABDCT  ?-standing cable row 1x10 @ 10lbs (increased today)  ?-seated Left shoulder isometric adduction towel roll 12x3secH  ? ? ? ? ? PT Education - 08/02/21 1018   ? ? Education Details Reviewed self management after DC   ? Person(s) Educated Patient   ? Methods Explanation   ? Comprehension Verbalized understanding   ? ?  ?  ? ?  ? ? ? PT Short Term Goals - 07/31/21 1044   ? ?  ? PT SHORT TERM GOAL #1  ? Title Pnt will be independent with HEP in order to progress rehab  at home in order to acheive ADLs.   ? Time 4   ? Period Weeks   ? Status Achieved   ? Target Date 07/12/21   ?  ? PT SHORT TERM GOAL #2  ? Title Pnt will decrease max NPS to 6/10 in order to achieve functional ADLs.   ? Baseline 06/14/21 8/10   ? Time 4   ? Period Weeks   ? Status Achieved   ? Target Date 07/12/21   ? ?  ?  ? ?  ? ? ? ? PT Long Term Goals - 07/31/21 1044   ? ?  ? PT LONG TERM GOAL #1  ? Title Pnt will increase functional ER to T3 and IR to T7 in order to dress and put bra on/off independently.   ? Baseline 2/9/23IR L: c3 R: c5  ER:  L: psis, R: psis; 3/28: unchanged largely, pain prohibited   ? Time 8   ? Period Weeks   ? Status On-going   ? Target Date 08/09/21   ?  ? PT LONG TERM GOAL #2  ? Title pnt will increase bilateral gross shoulder MMT to 4/5 in order to achieve functional ADLs.   ? Baseline 06/14/21 Flexion: R: 3+/5. L: 3+/5  Abduction: R: 3+/5. L: 3+/5  ER: R: 4/5. L: 3/5  IR: R: 5/5. L: 5/5; 3/28: painful with resistance in all , no appropriate for testing   ? Time 8   ? Period Weeks   ? Status On-going   ? Target Date 08/09/21   ?  ? PT LONG TERM GOAL #3  ? Title pnt will increase bilateral shoulder AROM flexion/abduction to 170 in order to complete overhead ADLs   ? Baseline 06/14/21 Flexion: R:150. L:140  Abduction: R: 152. L: 105; 07/31/21: Left ABDCT 134, Left Flexion 154   ? Time 8   ? Period Weeks   ? Status On-going   ? Target Date 08/09/21   ? ?  ?  ? ?  ? ? ? ? ? ? ? ? Plan - 08/02/21 1019   ? ? Clinical Impression Statement As planned with pt, today will be last day of PT to allow for financial and temporal respite for pt. Pt has made  good progress with pain free movement, but does still have clear pain with horizontal ABDCT. Pt is making progress in strength AEB progression in resistance, reps, and sets within session., however not yet to the degree where MMT is warranted today, especially with risk of re aggravation  of shoulder pain. Pt is pleased overall, plans to return later as  needed if her pain begins to worsen.   ? Personal Factors and Comorbidities Age;Time since onset of injury/illness/exacerbation;Other   ? Examination-Activity Limitations Bathing;Lift;Reach Overhead;Carry;Dressing;Bed Mobility;Hygiene/Grooming;Sleep   ? Examination-Participation Restrictions Cleaning;Laundry;Community Activity;Occupation;[employer] Work   ? Stability/Clinical Decision Making Evolving/Moderate complexity   ? Clinical Decision Making Moderate   ? Rehab Potential Good   ? PT Frequency 2x / week   ? PT Duration 8 weeks   ? PT Treatment/Interventions ADLs/Self Care Home Management;Electrical Stimulation;Iontophoresis '4mg'$ /ml Dexamethasone;Biofeedback;Gait training;Stair training;Functional mobility training;Therapeutic activities;Therapeutic exercise;Balance training;Neuromuscular re-education;Patient/family education;Manual techniques;Passive range of motion;Dry needling;Energy conservation;Joint Manipulations;Moist Heat;Ultrasound;Spinal Manipulations   ? PT Next Visit Plan Progress strengthening, update HEP as able   ? PT Home Exercise Plan banded ER, AAROM   ? Consulted and Agree with Plan of Care Patient   ? ?  ?  ? ?  ? ? ?Patient will benefit from skilled therapeutic intervention in order to improve the following deficits and impairments:  Decreased endurance, Decreased mobility, Impaired vision/preception, Decreased range of motion, Improper body mechanics, Pain, Impaired UE functional use, Decreased strength, Decreased activity tolerance, Hypomobility, Increased muscle spasms, Impaired flexibility ? ?Visit Diagnosis: ?Chronic left shoulder pain ? ?Chronic right shoulder pain ? ?Cramp and spasm ? ? ? ? ?Problem List ?Patient Active Problem List  ? Diagnosis Date Noted  ? Hx of colonic polyps   ? Polyp of descending colon   ? Chronic pain of both shoulders 06/01/2021  ? Arthritis of carpometacarpal [facility]) joint of right thumb 06/01/2021  ? Encounter for screening colonoscopy   ? Cecal polyp   ? Polyp of  ascending colon   ? Polyp of hepatic flexure of colon   ? Chronic left hip pain 05/09/2019  ? Venous stasis dermatitis of both lower extremities 11/04/2018  ? Pruritic erythematous rash 10/24/2018  ? H

## 2021-08-06 ENCOUNTER — Ambulatory Visit: Payer: PPO | Admitting: Physical Therapy

## 2021-08-09 ENCOUNTER — Ambulatory Visit: Payer: PPO | Admitting: Physical Therapy

## 2021-08-09 ENCOUNTER — Ambulatory Visit
Admission: RE | Admit: 2021-08-09 | Discharge: 2021-08-09 | Disposition: A | Discharge status: Home / Self Care | Payer: PPO | Source: Ambulatory Visit | Attending: Internal Medicine | Admitting: Internal Medicine

## 2021-08-09 DIAGNOSIS — Z1231 Encounter for screening mammogram for malignant neoplasm of breast: Secondary | ICD-10-CM | POA: Diagnosis not present

## 2021-08-13 ENCOUNTER — Encounter: Payer: PPO | Admitting: Physical Therapy

## 2021-08-16 ENCOUNTER — Encounter: Payer: PPO | Admitting: Physical Therapy

## 2021-08-20 ENCOUNTER — Encounter: Payer: PPO | Admitting: Physical Therapy

## 2021-08-23 ENCOUNTER — Encounter: Payer: PPO | Admitting: Physical Therapy

## 2021-08-28 ENCOUNTER — Encounter: Payer: PPO | Admitting: Physical Therapy

## 2021-08-28 ENCOUNTER — Telehealth: Payer: Self-pay | Admitting: Internal Medicine

## 2021-08-28 NOTE — Telephone Encounter (Signed)
Spoke with patient mother Remo Lipps, she stated patient did not live with her anymore.  ?

## 2021-08-30 ENCOUNTER — Encounter: Payer: PPO | Admitting: Physical Therapy

## 2021-11-02 DIAGNOSIS — M65311 Trigger thumb, right thumb: Secondary | ICD-10-CM | POA: Diagnosis not present

## 2021-12-10 IMAGING — MG DIGITAL SCREENING BILAT W/ TOMO W/ CAD
8 series · 8 of 24 positions shown · non-contrast
Comparison: Previous exam(s).

ACR Breast Density Category a: The breast tissue is almost entirely
fatty.

CLINICAL DATA: Screening.

EXAM:
DIGITAL SCREENING BILATERAL MAMMOGRAM WITH TOMO AND CAD

[R MLO synth-2D]
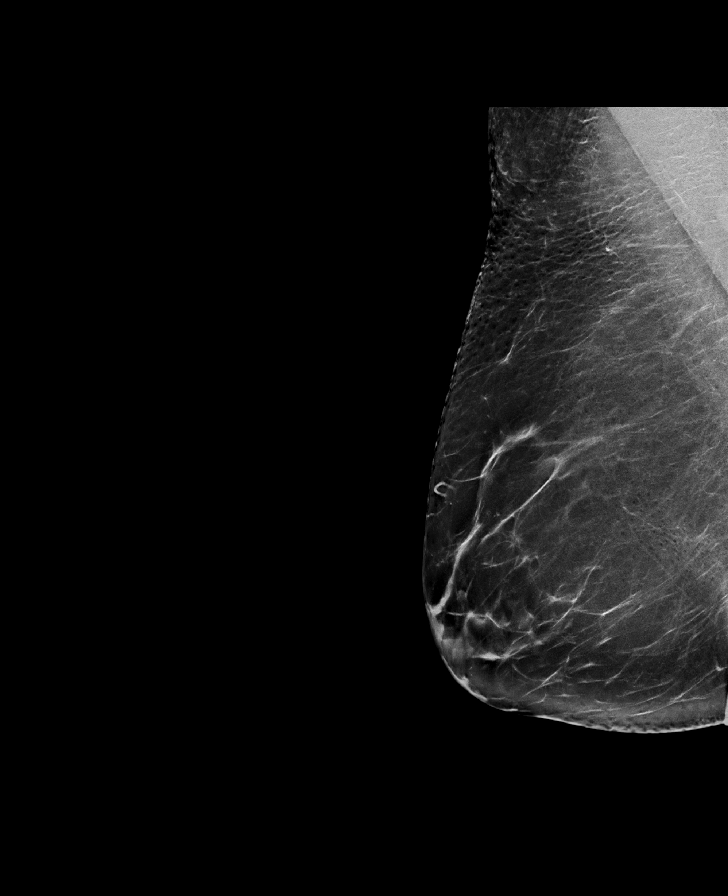

[L CC synth-2D]
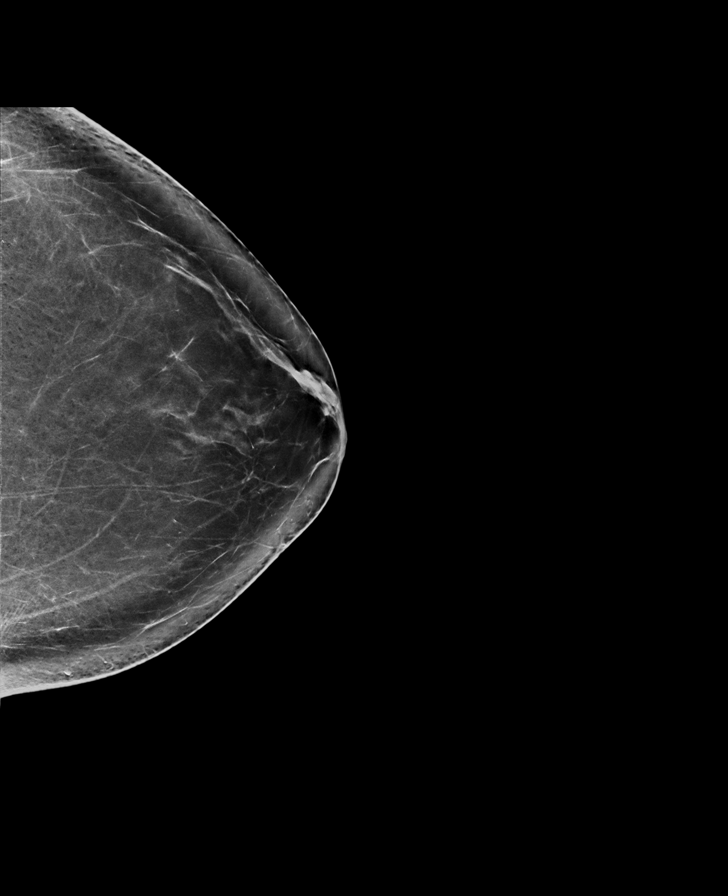

[L MLO synth-2D]
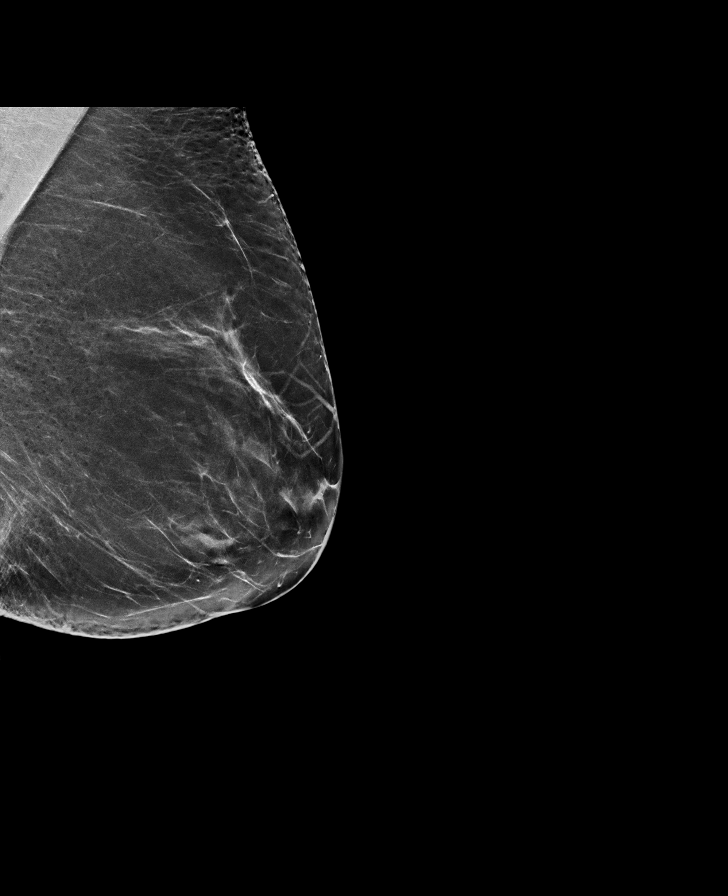

[R CC synth-2D]
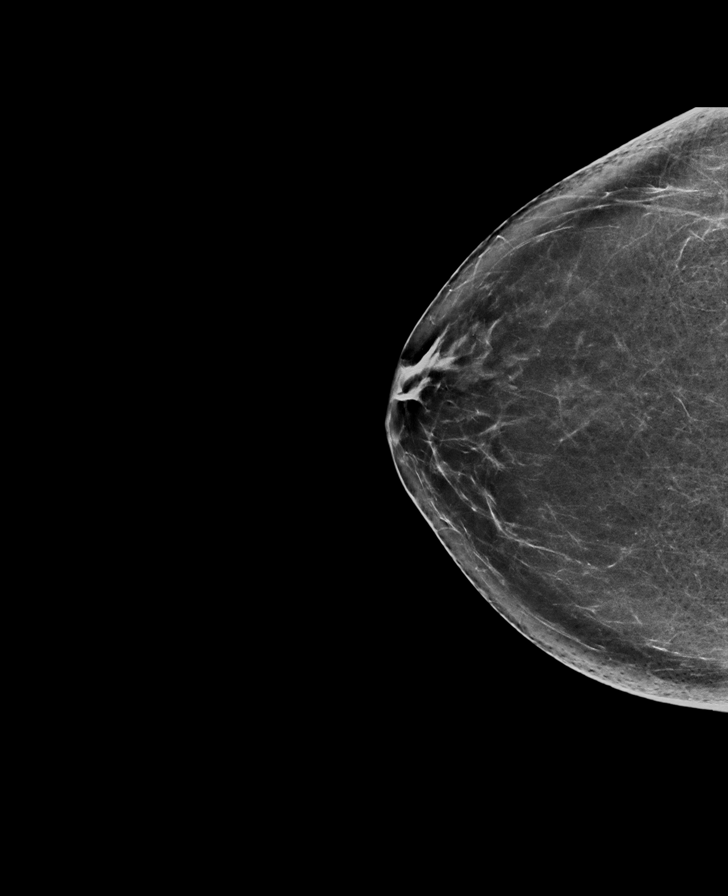

[L CC tomo · tomo slice 41/80.0]
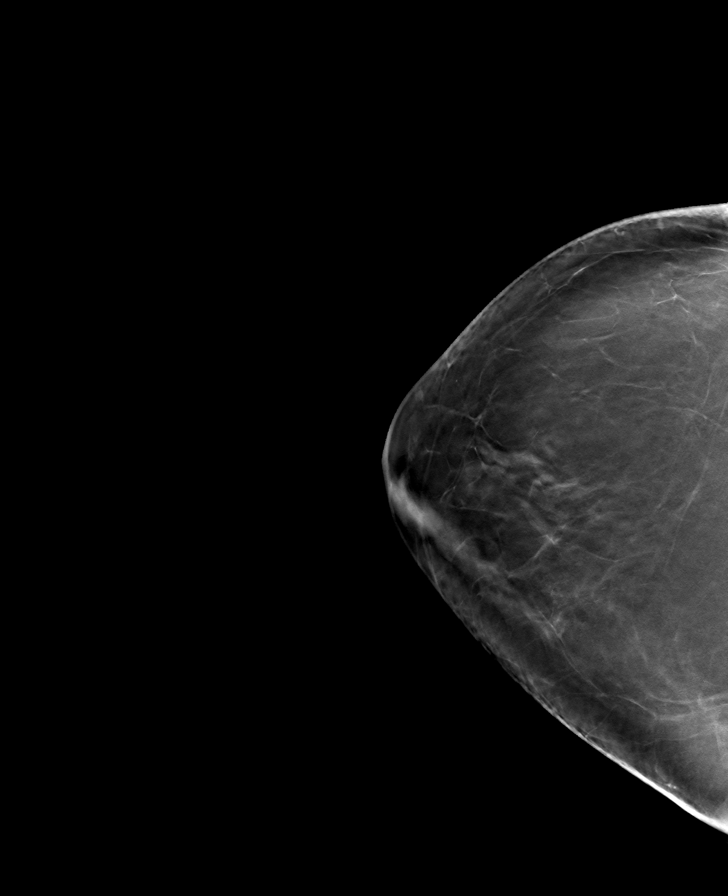

[L MLO tomo · tomo slice 40/79.0]
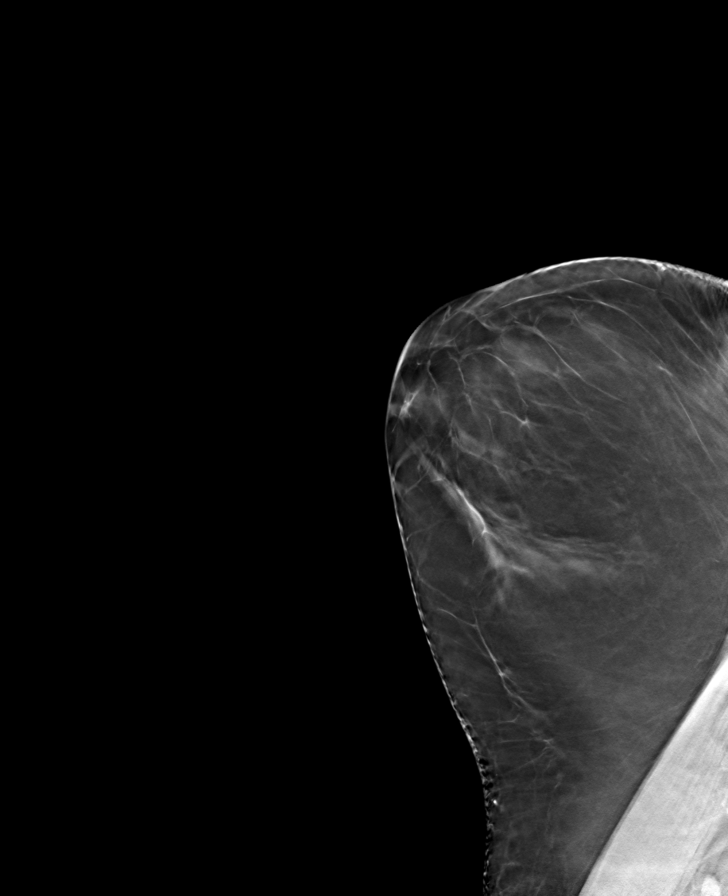

[R CC tomo · tomo slice 38/75.0]
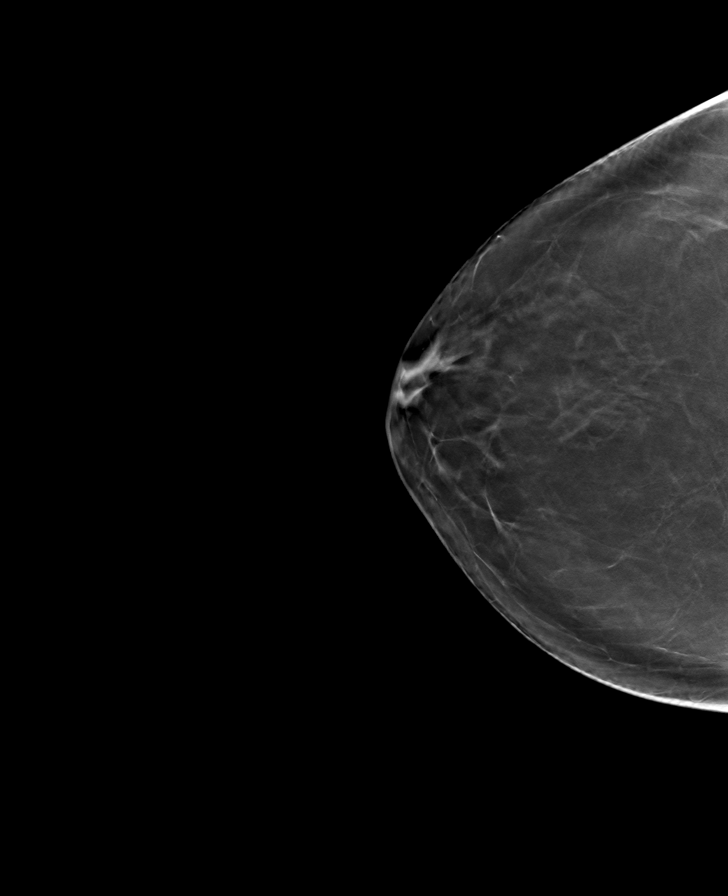

[R MLO tomo · tomo slice 45/89.0]
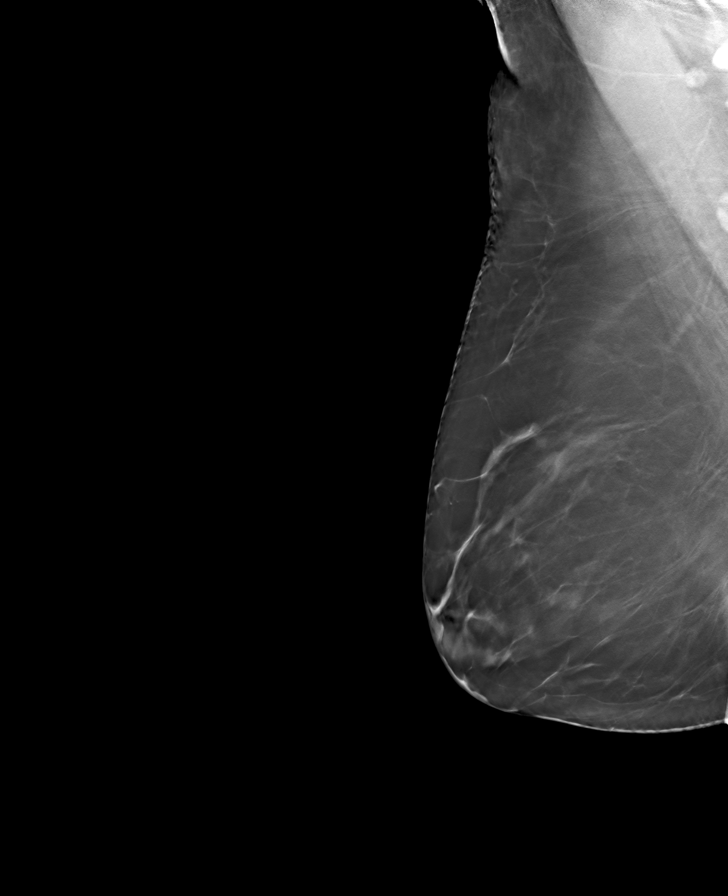

[8 of 24 positions shown; findings below may reference images not displayed]

FINDINGS: There are no findings suspicious for malignancy. Images were
processed with CAD.
IMPRESSION: No mammographic evidence of malignancy. A result letter of this
screening mammogram will be mailed directly to the patient.

RECOMMENDATION:
Screening mammogram in one year. (Code:[id number]-[id number])

BI-RADS CATEGORY  1: Negative.

## 2022-04-15 ENCOUNTER — Ambulatory Visit: Payer: PPO | Admitting: Internal Medicine

## 2022-06-03 ENCOUNTER — Encounter: Payer: Self-pay | Admitting: Internal Medicine

## 2022-06-03 ENCOUNTER — Ambulatory Visit (INDEPENDENT_AMBULATORY_CARE_PROVIDER_SITE_OTHER): Payer: PPO | Admitting: Internal Medicine

## 2022-06-03 VITALS — BP 124/74 | HR 75 | Temp 98.0°F | Ht 63.0 in | Wt 189.4 lb

## 2022-06-03 DIAGNOSIS — L659 Nonscarring hair loss, unspecified: Secondary | ICD-10-CM

## 2022-06-03 DIAGNOSIS — L298 Other pruritus: Secondary | ICD-10-CM | POA: Diagnosis not present

## 2022-06-03 DIAGNOSIS — R058 Other specified cough: Secondary | ICD-10-CM | POA: Insufficient documentation

## 2022-06-03 DIAGNOSIS — Z780 Asymptomatic menopausal state: Secondary | ICD-10-CM

## 2022-06-03 DIAGNOSIS — Z1329 Encounter for screening for other suspected endocrine disorder: Secondary | ICD-10-CM

## 2022-06-03 DIAGNOSIS — Z86010 Personal history of colonic polyps: Secondary | ICD-10-CM

## 2022-06-03 DIAGNOSIS — Z0000 Encounter for general adult medical examination without abnormal findings: Secondary | ICD-10-CM

## 2022-06-03 DIAGNOSIS — H903 Sensorineural hearing loss, bilateral: Secondary | ICD-10-CM

## 2022-06-03 DIAGNOSIS — E782 Mixed hyperlipidemia: Secondary | ICD-10-CM | POA: Diagnosis not present

## 2022-06-03 DIAGNOSIS — R5383 Other fatigue: Secondary | ICD-10-CM | POA: Diagnosis not present

## 2022-06-03 DIAGNOSIS — H3552 Pigmentary retinal dystrophy: Secondary | ICD-10-CM | POA: Diagnosis not present

## 2022-06-03 DIAGNOSIS — R7301 Impaired fasting glucose: Secondary | ICD-10-CM | POA: Diagnosis not present

## 2022-06-03 DIAGNOSIS — E66811 Obesity, class 1: Secondary | ICD-10-CM | POA: Insufficient documentation

## 2022-06-03 DIAGNOSIS — Z0001 Encounter for general adult medical examination with abnormal findings: Secondary | ICD-10-CM

## 2022-06-03 DIAGNOSIS — E669 Obesity, unspecified: Secondary | ICD-10-CM | POA: Diagnosis not present

## 2022-06-03 DIAGNOSIS — K5901 Slow transit constipation: Secondary | ICD-10-CM | POA: Diagnosis not present

## 2022-06-03 LAB — CBC WITH DIFFERENTIAL/PLATELET
Basophils Absolute: 0 10*3/uL (ref 0.0–0.1)
Basophils Relative: 0.3 % (ref 0.0–3.0)
Eosinophils Absolute: 0.1 10*3/uL (ref 0.0–0.7)
Eosinophils Relative: 1.9 % (ref 0.0–5.0)
HCT: 38.3 % (ref 36.0–46.0)
Hemoglobin: 12.9 g/dL (ref 12.0–15.0)
Lymphocytes Relative: 38.7 % (ref 12.0–46.0)
Lymphs Abs: 2.3 10*3/uL (ref 0.7–4.0)
MCHC: 33.6 g/dL (ref 30.0–36.0)
MCV: 92 fl (ref 78.0–100.0)
Monocytes Absolute: 0.6 10*3/uL (ref 0.1–1.0)
Monocytes Relative: 9.4 % (ref 3.0–12.0)
Neutro Abs: 2.9 10*3/uL (ref 1.4–7.7)
Neutrophils Relative %: 49.7 % (ref 43.0–77.0)
Platelets: 316 10*3/uL (ref 150.0–400.0)
RBC: 4.16 Mil/uL (ref 3.87–5.11)
RDW: 12.8 % (ref 11.5–15.5)
WBC: 5.8 10*3/uL (ref 4.0–10.5)

## 2022-06-03 LAB — COMPREHENSIVE METABOLIC PANEL
ALT: 35 U/L (ref 0–35)
AST: 37 U/L (ref 0–37)
Albumin: 4.2 g/dL (ref 3.5–5.2)
Alkaline Phosphatase: 92 U/L (ref 39–117)
BUN: 16 mg/dL (ref 6–23)
CO2: 28 mEq/L (ref 19–32)
Calcium: 9.4 mg/dL (ref 8.4–10.5)
Chloride: 104 mEq/L (ref 96–112)
Creatinine, Ser: 0.72 mg/dL (ref 0.40–1.20)
GFR: 86.56 mL/min (ref >60.00)
Glucose, Bld: 87 mg/dL (ref 70–99)
Potassium: 4.4 mEq/L (ref 3.5–5.1)
Sodium: 140 mEq/L (ref 135–145)
Total Bilirubin: 0.4 mg/dL (ref 0.2–1.2)
Total Protein: 7.4 g/dL (ref 6.0–8.3)

## 2022-06-03 LAB — HEMOGLOBIN A1C: Hgb A1c MFr Bld: 5.7 % (ref 4.6–6.5)

## 2022-06-03 LAB — LIPID PANEL
Cholesterol: 218 mg/dL — ABNORMAL HIGH (ref 0–200)
HDL: 68.1 mg/dL (ref >39.00)
LDL Cholesterol: 126 mg/dL — ABNORMAL HIGH (ref 0–99)
NonHDL: 150.02
Total CHOL/HDL Ratio: 3
Triglycerides: 120 mg/dL (ref 0.0–149.0)
VLDL: 24 mg/dL (ref 0.0–40.0)

## 2022-06-03 LAB — TSH: TSH: 2.11 u[IU]/mL (ref 0.35–5.50)

## 2022-06-03 MED ORDER — AZELASTINE HCL 0.05 % OP SOLN: 1.0000 [drp] | Freq: Two times a day (BID) | OPHTHALMIC | 12 refills | Status: AC

## 2022-06-03 MED ORDER — OMEPRAZOLE 20 MG PO CPDR: 20.0000 mg | DELAYED_RELEASE_CAPSULE | Freq: Every day | ORAL | 3 refills | Status: DC

## 2022-06-03 NOTE — Assessment & Plan Note (Signed)
Advised to resume BFLs

## 2022-06-03 NOTE — Patient Instructions (Addendum)
You need to lose 20 lbs !  You have gained 20 since 2019  and this is not a good trend.  Every adult needs to exercise for 30 minutes daily to help maintain a healthy weight and strong muscles . I want you to start using a stationery bike for 30 minutes every day   The cough may be due to a nighttime reflux issue .  Try elevating the head of your bed or try using a wedge pillow I recommend Starting omeprazole 20 mg .  Take it 30 minutes before bedtime. Return for a chest x ray  on Wednesday   Your annual mammogram has been ordered AND IS DUE in LATE  APRIL.   [place] [facility] will not allow [place] to schedule it for you,  so please call to make your appointment [phone]    You need to continue hypoallergenic soaps and shampoos since your dermatologist recommended

## 2022-06-03 NOTE — Assessment & Plan Note (Addendum)
Iron and zinc are normal DHEA is elevated.  Will initiate trial of spironolactone

## 2022-06-03 NOTE — Assessment & Plan Note (Signed)
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  Employs palpable sign language for communication

## 2022-06-03 NOTE — Assessment & Plan Note (Signed)
Follow up in 2030 per GI

## 2022-06-03 NOTE — Assessment & Plan Note (Signed)

## 2022-06-03 NOTE — Assessment & Plan Note (Signed)
Body mass index is 33.55 kg/m.  Weight gain addressed.  Advised to start exercising regularly

## 2022-06-03 NOTE — Assessment & Plan Note (Signed)
Chest x ray ordered. . Treating for GERD with omeprazole and  head of bed elevation

## 2022-06-03 NOTE — Assessment & Plan Note (Signed)
Managed with antihistamine and avoidance of fragrance

## 2022-06-03 NOTE — Progress Notes (Signed)
Patient ID: Helen Mccoy, female    DOB: 11-27-1955  Age: 67 y.o. MRN: [id number]  The patient is here for annual PREVENTIVE  examination and management of other chronic and acute problems.   The risk factors are reflected in the social history.  The roster of all physicians providing medical care to patient - is listed in the Snapshot section of the chart.  Activities of daily living:  The patient is 100% independent in all ADLs: dressing, toileting, feeding as well as independent mobility  Home safety : The patient has smoke detectors in the home. They wear seatbelts.  There are no firearms at home. There is no violence in the home.   There is no risks for hepatitis, STDs or HIV. There is no   history of blood transfusion. They have no travel history to infectious disease endemic areas of the world.  The patient has seen their dentist in the last six month.  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 Discussed the need for sun protection: hats, long sleeves and use of sunscreen if there is significant sun exposure.   Diet: the importance of a healthy diet is discussed. They do have a healthy diet.  The benefits of regular aerobic exercise were discussed. She is not exercising and has gained 20 lbs over the last 5 years .   Depression screen: there are no signs or vegative symptoms of depression- irritability, change in appetite, anhedonia, sadness/tearfullness.   The following portions of the patient's history were reviewed and updated as appropriate: allergies, current medications, past family history, past medical history,  past surgical history, past social history  and problem list.  Visual acuity was not assessed per patient preference since she has regular follow up with her ophthalmologist. Hearing and body mass index were assessed and  reviewed.   During the course of the visit the patient was educated and counseled about appropriate screening and preventive services including : fall prevention , diabetes screening, nutrition counseling, colorectal cancer screening, and recommended immunizations.    CC: The primary encounter diagnosis was Moderate mixed hyperlipidemia not requiring statin therapy. Diagnoses of Impaired fasting glucose, Other fatigue, Thyroid disorder screen, Postmenopausal estrogen deficiency, Nocturnal cough, Hair loss, Usher's syndrome, Pruritic erythematous rash, Hx of colonic polyps, Obesity (BMI 30.0-34.9), Constipation, slow transit, and Encounter for preventive health examination were also pertinent to this visit.  1) Trouble with chronic constipation.  Has tried fiber supplements in the past. Concerned about the change in the smell of her stools because "they smell bad."  reviewed 2023 colonoscopy,  one TA removed.  follow up 7 yrs advised.  Diet reviewed: no alcohol,  but diet is variable   2) fasting today ; wants results of labs mailed .  No mychart account   3)  does her own monthly breast exams. Defers exam today   4) hair loss:  feels that her hair loss is  excessive because she feels that there is more in the drain after her shower.  Has allergies to all fragrances,  uses hypoallergenic shampoo advised by her dermatologist.   Wears hair  pulled back all the time.  Scalp looks normal  5) cough occurs at night when she first lies down and in the middle of the night wakes her up.  Has been occurring  for years.  Has not tried sleeping on a wedge .  Does not eat within 3 hours of bedtime. Denies esophageal burning   6)Concern that her yawning makes her eyes water excessively.    History Helen Mccoy has a past medical history of Blind and Deafness (9 /24/1957).   She has a past surgical history that includes Tonsilectomy, adenoidectomy, bilateral myringotomy and tubes (Bilateral, 1968); Abdominal  hysterectomy (1986); Cosmetic surgery; Tubal ligation; Tonsillectomy; Eye surgery; Colonoscopy with propofol (N/A, 06/08/2020); and Colonoscopy with propofol (N/A, 06/22/2021).   Her family history includes Arthritis in her mother; Breast cancer (age of onset: 31) in her mother; Cancer in her mother; Hypertension in her father; Macular degeneration in her brother; Stroke in her father.She reports that she quit smoking about 23 years ago. Her smoking use included cigarettes. She smoked an average of 1 pack per day. She has never used smokeless tobacco. She reports current alcohol use. She reports that she does not use drugs.  Outpatient Medications Prior to Visit  Medication Sig Dispense Refill   Calcium Carbonate-Vitamin D 500-125 MG-UNIT TABS Take 1 tablet by mouth daily.     diclofenac Sodium (VOLTAREN) 1 % GEL Apply 2 g topically 4 (four) times daily. TO THUMB JOINT (Patient not taking: Reported on 06/03/2022) 100 g 5   azelastine (OPTIVAR) 0.05 % ophthalmic solution Apply 1 drop to eye 2 (two) times daily. 6 mL 12   No facility-administered medications prior to visit.    Review of Systems  Patient denies headache, fevers, malaise, unintentional weight loss, skin rash, eye pain, sinus congestion and sinus pain, sore throat, dysphagia,  hemoptysis ,  dyspnea, wheezing, chest pain, palpitations, orthopnea, edema, abdominal pain, nausea, melena, diarrhea, constipation, flank pain, dysuria, hematuria, urinary  Frequency, nocturia, numbness, tingling, seizures,  Focal weakness, Loss of consciousness,  Tremor, insomnia, depression, anxiety, and suicidal ideation.    Objective:  BP 124/74   Pulse 75   Temp 98 F (36.7 C)   Ht '5\' 3"'$  (1.6 m)   Wt 189 lb 6.4 oz (85.9 kg)   SpO2 96%   BMI 33.55 kg/m   Physical Exam Vitals reviewed.  Constitutional:      General: She is not in acute distress.    Appearance: Normal appearance. She is normal weight. She is not ill-appearing, toxic-appearing or  diaphoretic.  HENT:     Head: Normocephalic.  Eyes:     General: No scleral icterus.       Right eye: No discharge.        Left eye: No discharge.     Conjunctiva/sclera: Conjunctivae normal.  Cardiovascular:     Rate and Rhythm: Normal rate and regular rhythm.     Heart sounds: Normal heart sounds.  Pulmonary:     Effort: Pulmonary effort is normal. No respiratory distress.     Breath sounds: Normal breath sounds.  Musculoskeletal:        General: Normal range of motion.  Skin:    General: Skin is warm and dry.  Neurological:     General: No focal deficit present.     Mental Status: She is alert and oriented to person, place, and time. Mental status is at baseline.  Psychiatric:        Mood and Affect: Mood normal.        Behavior: Behavior normal.        Thought Content: Thought content normal.        Judgment: Judgment normal.  Assessment & Plan:  Moderate mixed hyperlipidemia not requiring statin therapy -     Comprehensive metabolic panel -     Lipid panel  Impaired fasting glucose -     Hemoglobin A1c  Other fatigue -     CBC with Differential/Platelet  Thyroid disorder screen  Postmenopausal estrogen deficiency -     DG Bone Density  Nocturnal cough Assessment & Plan: Chest x ray ordered. . Treating for GERD with omeprazole and  head of bed elevation  Orders: -     DG Chest 2 View; Future  Hair loss Assessment & Plan: Checking iron, zinc,  testoserone levels.  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  Employs palpable sign language for communication    Pruritic erythematous rash Assessment & Plan: Managed with antihistamine and avoidance of fragrance     Hx of colonic polyps Assessment & Plan:  Follow up in 2030 per GI   Obesity (BMI 30.0-34.9) Assessment & Plan: Body mass index is 33.55 kg/m.  Weight gain addressed.  Advised to start exercising regularly    Constipation, slow transit Assessment & Plan: Advised to resume BFLs   Encounter for preventive health examination Assessment & Plan: age appropriate education and counseling updated, referrals for preventative services and immunizations addressed, dietary and smoking counseling addressed, most recent labs reviewed.  I have personally reviewed and have noted:   1) the patient's medical and social history 2) The pt's use of alcohol, tobacco, and illicit drugs 3) The patient's current medications and supplements 4) Functional ability including ADL's, fall risk, home safety risk, hearing and visual impairment 5) Diet and physical activities 6) Evidence for depression or mood disorder 7) The patient's height, weight, and BMI have been recorded in the chart   I have made referrals, and provided counseling and education based on review of the above    Other orders -     Azelastine HCl; Apply 1 drop to eye 2 (two) times daily.  Dispense: 6 mL; Refill: 12 -     Omeprazole; Take 1 capsule (20 mg total) by mouth daily. Before dinner  Dispense: 30 capsule; Refill: 3     Follow-up: Return in about 6 months (around 12/02/2022).   Crecencio Mc, MD

## 2022-06-05 ENCOUNTER — Other Ambulatory Visit: Payer: PPO

## 2022-06-05 ENCOUNTER — Ambulatory Visit (INDEPENDENT_AMBULATORY_CARE_PROVIDER_SITE_OTHER): Payer: PPO

## 2022-06-05 DIAGNOSIS — R058 Other specified cough: Secondary | ICD-10-CM | POA: Diagnosis not present

## 2022-06-05 DIAGNOSIS — R059 Cough, unspecified: Secondary | ICD-10-CM | POA: Diagnosis not present

## 2022-06-05 LAB — DHEA-SULFATE: DHEA-SO4: 124 ug/dL — ABNORMAL HIGH (ref 9–118)

## 2022-06-05 LAB — IRON,TIBC AND FERRITIN PANEL
%SAT: 29 % (calc) (ref 16–45)
Ferritin: 99 ng/mL (ref 16–288)
Iron: 102 ug/dL (ref 45–160)
TIBC: 354 mcg/dL (calc) (ref 250–450)

## 2022-06-05 LAB — ZINC: Zinc: 94 ug/dL (ref 60–130)

## 2022-06-06 ENCOUNTER — Telehealth: Payer: Self-pay

## 2022-06-06 ENCOUNTER — Other Ambulatory Visit: Payer: Self-pay

## 2022-06-06 MED ORDER — SPIRONOLACTONE 25 MG PO TABS: 25.0000 mg | ORAL_TABLET | Freq: Every day | ORAL | 3 refills | Status: DC

## 2022-06-06 NOTE — Addendum Note (Signed)
Addended by: Crecencio Mc on: 06/06/2022 06:16 AM   Modules accepted: Orders

## 2022-06-06 NOTE — Telephone Encounter (Signed)
error 

## 2022-06-07 ENCOUNTER — Other Ambulatory Visit: Payer: Self-pay

## 2022-06-17 LAB — TESTOSTERONE,FREE AND TOTAL
Testosterone, Free: 0.3 pg/mL (ref 0.0–4.2)
Testosterone: 14 ng/dL (ref 3–67)

## 2022-07-08 ENCOUNTER — Other Ambulatory Visit: Payer: Self-pay | Admitting: Internal Medicine

## 2022-07-08 DIAGNOSIS — Z1231 Encounter for screening mammogram for malignant neoplasm of breast: Secondary | ICD-10-CM

## 2022-08-10 ENCOUNTER — Other Ambulatory Visit: Payer: Self-pay | Admitting: Internal Medicine

## 2022-08-10 DIAGNOSIS — M79644 Pain in right finger(s): Secondary | ICD-10-CM

## 2022-08-12 ENCOUNTER — Ambulatory Visit
Admission: RE | Admit: 2022-08-12 | Discharge: 2022-08-12 | Disposition: A | Discharge status: Home / Self Care | Payer: PPO | Source: Ambulatory Visit | Attending: Internal Medicine | Admitting: Internal Medicine

## 2022-08-12 DIAGNOSIS — Z1231 Encounter for screening mammogram for malignant neoplasm of breast: Secondary | ICD-10-CM | POA: Insufficient documentation

## 2022-08-13 ENCOUNTER — Telehealth: Payer: Self-pay

## 2022-08-13 NOTE — Telephone Encounter (Signed)
LMTCB. Need to let pt's mother know that pt's mammogram is normal.

## 2022-08-14 NOTE — Telephone Encounter (Signed)
Patient's mother returned office phone call and note was read.

## 2022-10-01 ENCOUNTER — Other Ambulatory Visit: Payer: Self-pay | Admitting: Internal Medicine

## 2022-12-02 ENCOUNTER — Ambulatory Visit: Payer: PPO | Admitting: Internal Medicine

## 2022-12-03 ENCOUNTER — Other Ambulatory Visit: Payer: Self-pay

## 2022-12-03 MED ORDER — OMEPRAZOLE 20 MG PO CPDR: 20.0000 mg | DELAYED_RELEASE_CAPSULE | Freq: Every day | ORAL | 3 refills | Status: DC

## 2023-01-21 ENCOUNTER — Ambulatory Visit (INDEPENDENT_AMBULATORY_CARE_PROVIDER_SITE_OTHER): Payer: PPO | Admitting: Internal Medicine

## 2023-01-21 ENCOUNTER — Encounter: Payer: Self-pay | Admitting: Internal Medicine

## 2023-01-21 VITALS — BP 118/60 | HR 64 | Ht 63.0 in | Wt 179.6 lb

## 2023-01-21 DIAGNOSIS — R058 Other specified cough: Secondary | ICD-10-CM | POA: Diagnosis not present

## 2023-01-21 DIAGNOSIS — Z23 Encounter for immunization: Secondary | ICD-10-CM

## 2023-01-21 DIAGNOSIS — M79644 Pain in right finger(s): Secondary | ICD-10-CM

## 2023-01-21 DIAGNOSIS — L659 Nonscarring hair loss, unspecified: Secondary | ICD-10-CM | POA: Diagnosis not present

## 2023-01-21 DIAGNOSIS — Z780 Asymptomatic menopausal state: Secondary | ICD-10-CM | POA: Insufficient documentation

## 2023-01-21 DIAGNOSIS — E782 Mixed hyperlipidemia: Secondary | ICD-10-CM

## 2023-01-21 DIAGNOSIS — L600 Ingrowing nail: Secondary | ICD-10-CM | POA: Insufficient documentation

## 2023-01-21 LAB — COMPREHENSIVE METABOLIC PANEL
ALT: 22 U/L (ref 0–35)
AST: 30 U/L (ref 0–37)
Albumin: 4.2 g/dL (ref 3.5–5.2)
Alkaline Phosphatase: 87 U/L (ref 39–117)
BUN: 15 mg/dL (ref 6–23)
CO2: 29 meq/L (ref 19–32)
Calcium: 9.7 mg/dL (ref 8.4–10.5)
Chloride: 101 meq/L (ref 96–112)
Creatinine, Ser: 0.81 mg/dL (ref 0.40–1.20)
GFR: 74.82 mL/min (ref >60.00)
Glucose, Bld: 88 mg/dL (ref 70–99)
Potassium: 4.2 meq/L (ref 3.5–5.1)
Sodium: 138 meq/L (ref 135–145)
Total Bilirubin: 0.5 mg/dL (ref 0.2–1.2)
Total Protein: 7.8 g/dL (ref 6.0–8.3)

## 2023-01-21 LAB — HEMOGLOBIN A1C: Hgb A1c MFr Bld: 5.7 % (ref 4.6–6.5)

## 2023-01-21 LAB — CBC WITH DIFFERENTIAL/PLATELET
Basophils Absolute: 0 10*3/uL (ref 0.0–0.1)
Basophils Relative: 0.5 % (ref 0.0–3.0)
Eosinophils Absolute: 0.1 10*3/uL (ref 0.0–0.7)
Eosinophils Relative: 1.1 % (ref 0.0–5.0)
HCT: 39.7 % (ref 36.0–46.0)
Hemoglobin: 13.1 g/dL (ref 12.0–15.0)
Lymphocytes Relative: 37.2 % (ref 12.0–46.0)
Lymphs Abs: 2.3 10*3/uL (ref 0.7–4.0)
MCHC: 32.9 g/dL (ref 30.0–36.0)
MCV: 91.4 fl (ref 78.0–100.0)
Monocytes Absolute: 0.6 10*3/uL (ref 0.1–1.0)
Monocytes Relative: 9.3 % (ref 3.0–12.0)
Neutro Abs: 3.3 10*3/uL (ref 1.4–7.7)
Neutrophils Relative %: 51.9 % (ref 43.0–77.0)
Platelets: 321 10*3/uL (ref 150.0–400.0)
RBC: 4.34 Mil/uL (ref 3.87–5.11)
RDW: 12.8 % (ref 11.5–15.5)
WBC: 6.3 10*3/uL (ref 4.0–10.5)

## 2023-01-21 LAB — LIPID PANEL
Cholesterol: 192 mg/dL (ref 0–200)
HDL: 58.7 mg/dL (ref >39.00)
LDL Cholesterol: 112 mg/dL — ABNORMAL HIGH (ref 0–99)
NonHDL: 133.23
Total CHOL/HDL Ratio: 3
Triglycerides: 106 mg/dL (ref 0.0–149.0)
VLDL: 21.2 mg/dL (ref 0.0–40.0)

## 2023-01-21 LAB — TSH: TSH: 1.71 u[IU]/mL (ref 0.35–5.50)

## 2023-01-21 LAB — LDL CHOLESTEROL, DIRECT: Direct LDL: 130 mg/dL

## 2023-01-21 MED ORDER — DICLOFENAC SODIUM 1 % EX GEL: CUTANEOUS | 5 refills | Status: AC

## 2023-01-21 MED ORDER — ESTRADIOL 1 MG PO TABS: 1.0000 mg | ORAL_TABLET | Freq: Every day | ORAL | 1 refills | Status: DC

## 2023-01-21 NOTE — Patient Instructions (Addendum)
1) Obesity:  congratulations on the weight loss !  You have lost 10 lbs, !!!  Your next goal  weight is to get your weight down to 167   I recommend that you  Start using the stationery bike  with the moving arms.  And work your way up to 30 minutes daily ,  and no coasting    2) Menopause :  Trial of estrogen 1tablets for menopause ; one  mg /one tablet daily    3 Hair loss:  Stay on the spironolactone.  It modifies your female hormones which contribute to hair loss after menopause  Tryadding  "Hair Skin and Nails" OTC supplements ([employer] has one)    4 ) Omeprazole is treating the cough created by silent reflux    5)  Referral to Podiatry for ingrown toenail.  Try soaking your  toe in epsom salts to relieve inflammation  DO NOT CUT ON THE TOENAIL . LET THE PODIATRIST DO THAT

## 2023-01-21 NOTE — Assessment & Plan Note (Addendum)
Managed as reflux.  Continue PPI

## 2023-01-21 NOTE — Assessment & Plan Note (Signed)
Symptoms are limited to recurrent night seats and hot flushes.  S/p hysterectomy.   estrace prescribed

## 2023-01-21 NOTE — Progress Notes (Signed)
Subjective:  Patient ID: Helen Mccoy, female    DOB: 1956/04/07  Age: 67 y.o. MRN: [id number]  CC: The primary encounter diagnosis was Menopause. Diagnoses of Thumb pain, right, Moderate mixed hyperlipidemia not requiring statin therapy, Ingrown right big toenail, Need for influenza vaccination, Need for pneumococcal 20-valent conjugate vaccination, Hair loss, and Nocturnal cough were also pertinent to this visit.   HPI Helen Mccoy presents for  Chief Complaint  Patient presents with   Medical Management of Chronic Issues    6 month follow up     Helen Mccoy is a 67 yr old female with Usher's Syndrome (deaf/blind ) , obesity and OA of multiple joints who presents today for 6 month follow up on alopecia as well.  She is accompanied by her other but has a Chief Technology Officer present to communicate via sig n language.    Obesity:  has lost 10 lbs since January  .with portion reduction and limitation of sweets  .  Her mother has been very supportive   Alopecia:  trial of spironolactone given in January  for mildly elevated DHEA , normal testosterone level.   No change. . Hair thinning over the cap.   GERD cough ;  taking omeprazole   Menopause:  she is constantly  sweating, notes that the symptoms are  worse after the shower .  Discussed estrogen therapy; she is s/p hysterectomy  Right great toe has become painful at the lateral cuticle,  toenial have developed a curvature that makes them likely to ingrow  Outpatient Medications Prior to Visit  Medication Sig Dispense Refill   azelastine (OPTIVAR) 0.05 % ophthalmic solution Apply 1 drop to eye 2 (two) times daily. 6 mL 12   Calcium Carbonate-Vitamin D 500-125 MG-UNIT TABS Take 1 tablet by mouth daily.     omeprazole (PRILOSEC) 20 MG capsule Take 1 capsule (20 mg total) by mouth daily. 30 capsule 3   spironolactone (ALDACTONE) 25 MG tablet Take 1 tablet (25 mg total) by mouth daily. 90 tablet 3   diclofenac Sodium  (VOLTAREN) 1 % GEL APPLY 2G TOPICALLY FOUR TIMES DAILY TO THUMB JOINT 100 g 5   No facility-administered medications prior to visit.    Review of Systems;  Patient denies headache, fevers, malaise, unintentional weight loss, skin rash, eye pain, sinus congestion and sinus pain, sore throat, dysphagia,  hemoptysis , cough, dyspnea, wheezing, chest pain, palpitations, orthopnea, edema, abdominal pain, nausea, melena, diarrhea, constipation, flank pain, dysuria, hematuria, urinary  Frequency, nocturia, numbness, tingling, seizures,  Focal weakness, Loss of consciousness,  Tremor, insomnia, depression, anxiety, and suicidal ideation.      Objective:  BP 118/60   Pulse 64   Ht 5\' 3"  (1.6 m)   Wt 179 lb 9.6 oz (81.5 kg)   SpO2 97%   BMI 31.81 kg/m   BP Readings from Last 3 Encounters:  01/21/23 118/60  06/03/22 124/74  06/22/21 111/69    Wt Readings from Last 3 Encounters:  01/21/23 179 lb 9.6 oz (81.5 kg)  06/03/22 189 lb 6.4 oz (85.9 kg)  06/22/21 186 lb 15.2 oz (84.8 kg)    Physical Exam Vitals reviewed.  Constitutional:      General: She is not in acute distress.    Appearance: Normal appearance. She is normal weight. She is not ill-appearing, toxic-appearing or diaphoretic.  HENT:     Head: Normocephalic.  Eyes:     General: No scleral icterus.       Right eye:  No discharge.        Left eye: No discharge.     Conjunctiva/sclera: Conjunctivae normal.  Cardiovascular:     Rate and Rhythm: Normal rate and regular rhythm.     Heart sounds: Normal heart sounds.  Pulmonary:     Effort: Pulmonary effort is normal. No respiratory distress.     Breath sounds: Normal breath sounds.  Musculoskeletal:        General: Normal range of motion.       Feet:  Feet:     Comments: Tenderness without inflammation or erythema.   Sides of the great nare curved downward  Skin:    General: Skin is warm and dry.  Neurological:     General: No focal deficit present.     Mental  Status: She is alert and oriented to person, place, and time. Mental status is at baseline.  Psychiatric:        Mood and Affect: Mood normal.        Behavior: Behavior normal.        Thought Content: Thought content normal.        Judgment: Judgment normal.    Lab Results  Component Value Date   HGBA1C 5.7 01/21/2023   HGBA1C 5.7 06/03/2022   HGBA1C 5.6 05/29/2021    Lab Results  Component Value Date   CREATININE 0.81 01/21/2023   CREATININE 0.72 06/03/2022   CREATININE 0.80 05/29/2021    Lab Results  Component Value Date   WBC 6.3 01/21/2023   HGB 13.1 01/21/2023   HCT 39.7 01/21/2023   PLT 321.0 01/21/2023   GLUCOSE 88 01/21/2023   CHOL 192 01/21/2023   TRIG 106.0 01/21/2023   HDL 58.70 01/21/2023   LDLDIRECT 130.0 01/21/2023   LDLCALC 112 (H) 01/21/2023   ALT 22 01/21/2023   AST 30 01/21/2023   NA 138 01/21/2023   K 4.2 01/21/2023   CL 101 01/21/2023   CREATININE 0.81 01/21/2023   BUN 15 01/21/2023   CO2 29 01/21/2023   TSH 1.71 01/21/2023   HGBA1C 5.7 01/21/2023    MM 3D SCREEN BREAST BILATERAL  Result Date: 08/13/2022 CLINICAL DATA:  Screening. EXAM: DIGITAL SCREENING BILATERAL MAMMOGRAM WITH TOMOSYNTHESIS AND CAD TECHNIQUE: Bilateral screening digital craniocaudal and mediolateral oblique mammograms were obtained. Bilateral screening digital breast tomosynthesis was performed. The images were evaluated with computer-aided detection. COMPARISON:  Previous exam(s). ACR Breast Density Category a: The breasts are almost entirely fatty. FINDINGS: There are no findings suspicious for malignancy. IMPRESSION: No mammographic evidence of malignancy. A result letter of this screening mammogram will be mailed directly to the patient. RECOMMENDATION: Screening mammogram in one year. (Code:SM-B-01Y) BI-RADS CATEGORY  1: Negative. Electronically Signed   By: Sande Brothers M.D.   On: 08/13/2022 09:21    Assessment & Plan:  .Menopause Assessment & Plan: Symptoms are  limited to recurrent night seats and hot flushes.  S/p hysterectomy.   estrace prescribed   Orders: -     TSH  Thumb pain, right -     Diclofenac Sodium; APPLY 2G TOPICALLY FOUR TIMES DAILY TO THUMB JOINT  Dispense: 100 g; Refill: 5 -     CBC with Differential/Platelet  Moderate mixed hyperlipidemia not requiring statin therapy -     Lipid panel -     LDL cholesterol, direct -     Comprehensive metabolic panel -     Hemoglobin A1c  Ingrown right big toenail Assessment & Plan: Referring to podiatry.  Advised to avoid  cutting nail   Orders: -     Ambulatory referral to Podiatry  Need for influenza vaccination -     Flu Vaccine Trivalent High Dose (Fluad)  Need for pneumococcal 20-valent conjugate vaccination -     Pneumococcal conjugate vaccine 20-valent  Hair loss Assessment & Plan: General thinning of hair. And widening of part. Noted. Continue spironolactone. Screening labs normal other than slight elevation in DHEA . Advised to add Hair skin & Nail s multivitamin ,  see dermatology if no improvement    Nocturnal cough Assessment & Plan: Managed as reflux.  Continue PPI    Other orders -     Estradiol; Take 1 tablet (1 mg total) by mouth daily.  Dispense: 90 tablet; Refill: 1     I provided 30 minutes of face-to-face time during this encounter reviewing patient's last visit with me, patient's  most recent visit with cardiology,  nephrology,  and neurology,  recent surgical and non surgical procedures, previous  labs and imaging studies, counseling on currently addressed issues,  and post visit ordering to diagnostics and therapeutics .   Follow-up: Return in about 6 months (around 07/21/2023) for physical.   Sherlene Shams, MD

## 2023-01-21 NOTE — Assessment & Plan Note (Signed)
Referring to podiatry.  Advised to avoid cutting nail

## 2023-01-21 NOTE — Assessment & Plan Note (Signed)
General thinning of hair. And widening of part. Noted. Continue spironolactone. Screening labs normal other than slight elevation in DHEA . Advised to add Hair skin & Nail s multivitamin ,  see dermatology if no improvement

## 2023-01-23 NOTE — Assessment & Plan Note (Signed)
Based on her  current lipid profile, the risk of clinically significant CAD is < 7% over the next 10 years, using the Framingham risk calculator. No therapy is needed based on the guidelines by the [employer] of Cardiology at this time.  Lab Results  Component Value Date   CHOL 192 01/21/2023   HDL 58.70 01/21/2023   LDLCALC 112 (H) 01/21/2023   LDLDIRECT 130.0 01/21/2023   TRIG 106.0 01/21/2023   CHOLHDL 3 01/21/2023

## 2023-02-03 ENCOUNTER — Ambulatory Visit: Payer: PPO | Admitting: Internal Medicine

## 2023-02-25 ENCOUNTER — Ambulatory Visit: Payer: Self-pay | Admitting: Podiatry

## 2023-03-11 ENCOUNTER — Ambulatory Visit: Payer: PPO | Admitting: Podiatry

## 2023-03-11 ENCOUNTER — Encounter: Payer: Self-pay | Admitting: Podiatry

## 2023-03-11 VITALS — Ht 63.0 in | Wt 179.6 lb

## 2023-03-11 DIAGNOSIS — M79675 Pain in left toe(s): Secondary | ICD-10-CM

## 2023-03-11 DIAGNOSIS — B351 Tinea unguium: Secondary | ICD-10-CM

## 2023-03-11 DIAGNOSIS — Q666 Other congenital valgus deformities of feet: Secondary | ICD-10-CM | POA: Diagnosis not present

## 2023-03-11 DIAGNOSIS — M79674 Pain in right toe(s): Secondary | ICD-10-CM | POA: Diagnosis not present

## 2023-03-11 NOTE — Progress Notes (Signed)
  Subjective:  Patient ID: Helen Mccoy, female    DOB: 1956/03/03,  MRN: [id number]  Chief Complaint  Patient presents with   Foot Pain    PT states she is her for her toe nails, they are growing side ways, she clips them herself but always grows back side ways, mostly the 2 greater toes, pain in both pinky toe feels like a knot.    67 y.o. female returns for the above complaint.  Patient presents with thickened onychodystrophy mycotic toenails x 10 mild pain on palpation arch with ambulation worse with pressure she would like for me to debride down she is not able to do it herself she has secondary complaint of flatfoot deformity for which she would like to discuss treatment options for this.  She started getting some arch and heel pain when she has been ambulating for a while.  Objective:  There were no vitals filed for this visit. Podiatric Exam: Vascular: dorsalis pedis and posterior tibial pulses are palpable bilateral. Capillary return is immediate. Temperature gradient is WNL. Skin turgor WNL  Sensorium: Normal Semmes Weinstein monofilament test. Normal tactile sensation bilaterally. Nail Exam: Pt has thick disfigured discolored nails with subungual debris noted bilateral entire nail hallux through fifth toenails.  Pain on palpation to the nails. Ulcer Exam: There is no evidence of ulcer or pre-ulcerative changes or infection. Orthopedic Exam: Muscle tone and strength are WNL. No limitations in general ROM. No crepitus or effusions noted.  Gait examination shows pes planovalgus foot structure calcaneovalgus to many toe signs partially recurred the arch with dorsiflexion of the hallux unable to perform single and double heel raise Skin: No Porokeratosis. No infection or ulcers    Assessment & Plan:   1. Pain due to onychomycosis of toenails of both feet   2. Pes planovalgus     Patient was evaluated and treated and all questions answered.  Pes planovalgus -I explained to  patient the etiology of pes planovalgus and relationship with Planter fasciitis and various treatment options were discussed.  Given patient foot structure in the setting of Planter fasciitis I believe patient will benefit from custom-made orthotics to help control the hindfoot motion support the arch of the foot and take the stress away from plantar fascial.  Patient agrees with the plan like to proceed with orthotics -Patient was casted for orthotics   Onychomycosis with pain  -Nails palliatively debrided as below. -Educated on self-care  Procedure: Nail Debridement Rationale: pain  Type of Debridement: manual, sharp debridement. Instrumentation: Nail nipper, rotary burr. Number of Nails: 10  Procedures and Treatment: Consent by patient was obtained for treatment procedures. The patient understood the discussion of treatment and procedures well. All questions were answered thoroughly reviewed. Debridement of mycotic and hypertrophic toenails, 1 through 5 bilateral and clearing of subungual debris. No ulceration, no infection noted.  Return Visit-Office Procedure: Patient instructed to return to the office for a follow up visit 3 months for continued evaluation and treatment.  Nicholes Rough, DPM    No follow-ups on file.

## 2023-04-01 NOTE — Progress Notes (Signed)
Order sent to [facility] for [facility]  Will call patient for fitting when in  [place]

## 2023-05-08 ENCOUNTER — Telehealth: Payer: Self-pay

## 2023-05-08 NOTE — Telephone Encounter (Signed)
 Called pt to reschedule appt to later date. Nicki Guadalajara will be out of office

## 2023-05-15 ENCOUNTER — Telehealth: Payer: Self-pay

## 2023-05-15 NOTE — Telephone Encounter (Signed)
 Taking orthotics to [facility] 1/10

## 2023-05-26 ENCOUNTER — Other Ambulatory Visit: Payer: PPO

## 2023-05-27 ENCOUNTER — Other Ambulatory Visit: Payer: Self-pay

## 2023-05-27 MED ORDER — SPIRONOLACTONE 25 MG PO TABS: 25.0000 mg | ORAL_TABLET | Freq: Every day | ORAL | 1 refills | Status: AC

## 2023-05-30 ENCOUNTER — Ambulatory Visit: Payer: PPO

## 2023-05-30 NOTE — Progress Notes (Unsigned)
Patient presents today to pick up custom molded foot orthotics, diagnosed with Pes Planus by Dr. Allena Katz.   Orthotics were dispensed and fit was satisfactory. Reviewed instructions for break-in and wear. Written instructions given to patient.  Patient will follow up as needed. Sending for authorization HTA   Helen Mccoy CPed, CFo CFm

## 2023-06-12 ENCOUNTER — Encounter: Payer: Self-pay | Admitting: Podiatry

## 2023-06-12 ENCOUNTER — Ambulatory Visit: Payer: PPO | Admitting: Podiatry

## 2023-06-12 VITALS — Ht 63.0 in | Wt 179.6 lb

## 2023-06-12 DIAGNOSIS — B351 Tinea unguium: Secondary | ICD-10-CM

## 2023-06-12 DIAGNOSIS — M79674 Pain in right toe(s): Secondary | ICD-10-CM

## 2023-06-12 DIAGNOSIS — M79675 Pain in left toe(s): Secondary | ICD-10-CM | POA: Diagnosis not present

## 2023-06-12 NOTE — Patient Instructions (Signed)
 Apply triple antibiotic ointment to both great toes once daily for one week. If you have any problems, call office to schedule appointment with Dr. Tobie.  Ingrown Toenail  An ingrown toenail occurs when the corner or sides of a toenail grow into the surrounding skin. This causes discomfort and pain. The big toe is most commonly affected, but any of the toes can be affected. If an ingrown toenail is not treated, it can become infected. What are the causes? This condition may be caused by: Wearing shoes that are too small or tight. An injury, such as stubbing your toe or having your toe stepped on. Improper cutting or care of your toenails. Having nail or foot abnormalities that were present from birth (congenital abnormalities), such as having a nail that is too big for your toe. What increases the risk? The following factors may make you more likely to develop ingrown toenails: Age. Nails tend to get thicker with age, so ingrown nails are more common among older people. Cutting your toenails incorrectly, such as cutting them very short or cutting them unevenly. An ingrown toenail is more likely to get infected if you have: Diabetes. Blood flow (circulation) problems. What are the signs or symptoms? Symptoms of an ingrown toenail may include: Pain, soreness, or tenderness. Redness. Swelling. Hardening of the skin that surrounds the toenail. Signs that an ingrown toenail may be infected include: Fluid or pus. Symptoms that get worse. How is this diagnosed? Ingrown toenails may be diagnosed based on: Your symptoms and medical history. A physical exam. Labs or tests. If you have fluid or blood coming from your toenail, a sample may be collected to test for the specific type of bacteria that is causing the infection. How is this treated? Treatment depends on the severity of your symptoms. You may be able to care for your toenail at home. If you have an infection, you may be prescribed  antibiotic medicines. If you have fluid or pus draining from your toenail, your health care provider may drain it. If you have trouble walking, you may be given crutches to use. If you have a severe or infected ingrown toenail, you may need a procedure to remove part or all of the nail. Follow these instructions at home: Foot care  Check your wound every day for signs of infection, or as often as told by your health care provider. Check for: More redness, swelling, or pain. More fluid or blood. Warmth. Pus or a bad smell. Do not pick at your toenail or try to remove it yourself. Soak your foot in warm, soapy water. Do this for 20 minutes, 3 times a day, or as often as told by your health care provider. This helps to keep your toe clean and your skin soft. Wear shoes that fit well and are not too tight. Your health care provider may recommend that you wear open-toed shoes while you heal. Trim your toenails regularly and carefully. Cut your toenails straight across to prevent injury to the skin at the corners of the toenail. Do not cut your nails in a curved shape. Keep your feet clean and dry to help prevent infection. General instructions Take over-the-counter and prescription medicines only as told by your health care provider. If you were prescribed an antibiotic, take it as told by your health care provider. Do not stop taking the antibiotic even if you start to feel better. If your health care provider told you to use crutches to help you move around,  use them as instructed. Return to your normal activities as told by your health care provider. Ask your health care provider what activities are safe for you. Keep all follow-up visits. This is important. Contact a health care provider if: You have more redness, swelling, pain, or other symptoms that do not improve with treatment. You have fluid, blood, or pus coming from your toenail. You have a red streak on your skin that starts at your  foot and spreads up your leg. You have a fever. Summary An ingrown toenail occurs when the corner or sides of a toenail grow into the surrounding skin. This causes discomfort and pain. The big toe is most commonly affected, but any of the toes can be affected. If an ingrown toenail is not treated, it can become infected. Fluid or pus draining from your toenail is a sign of infection. Your health care provider may need to drain it. You may be given antibiotics to treat the infection. Trimming your toenails regularly and properly can help you prevent an ingrown toenail. This information is not intended to replace advice given to you by your health care provider. Make sure you discuss any questions you have with your health care provider. Document Revised: 08/22/2020 Document Reviewed: 08/22/2020 Elsevier Patient Education  2024 [employer].

## 2023-06-17 ENCOUNTER — Other Ambulatory Visit: Payer: Self-pay

## 2023-06-17 MED ORDER — OMEPRAZOLE 20 MG PO CPDR: 20.0000 mg | DELAYED_RELEASE_CAPSULE | Freq: Every day | ORAL | 3 refills | Status: DC

## 2023-06-20 ENCOUNTER — Encounter: Payer: Self-pay | Admitting: Podiatry

## 2023-06-20 NOTE — Progress Notes (Signed)
  Subjective:  Patient ID: Helen Mccoy, female    DOB: February 16, 1956,  MRN: [id number]  68 y.o. female presents painful elongated mycotic toenails 1-5 bilaterally which are tender when wearing enclosed shoe gear. Pain is relieved with periodic professional debridement.  Chief Complaint  Patient presents with   Nail Problem    Pt is here for RFC not a diabetic PCP is Dr [name] and LOV was in September.   New problem(s): None   PCP is [name] Verneita CROME, MD.  Allergies  Allergen Reactions   Sulfa Antibiotics Hives and Nausea And Vomiting    Review of Systems: Negative except as noted in the HPI.   Objective:  Helen Mccoy is a pleasant 68 y.o. female WD, WN in NAD. AAO x 3.  Vascular Examination: Vascular status intact b/l with palpable pedal pulses. CFT immediate b/l. Pedal hair present. No edema. No pain with calf compression b/l. Skin temperature gradient WNL b/l. No varicosities noted. No cyanosis or clubbing noted.  Neurological Examination: Sensation grossly intact b/l with 10 gram monofilament. Vibratory sensation intact b/l.  Dermatological Examination: Pedal skin with normal turgor, texture and tone b/l. No open wounds nor interdigital macerations noted. Toenails 2-5 b/l thick, discolored, elongated with subungual debris and pain on dorsal palpation. Incurvated nailplate both borders of left hallux and both borders of right hallux with tenderness to palpation. No erythema, no edema, no drainage noted. No fluctuance.  No hyperkeratotic lesions noted b/l.   Musculoskeletal Examination: Muscle strength 5/5 to b/l LE.  No pain, crepitus noted b/l. No gross pedal deformities. Patient ambulates independently without assistive aids.   Radiographs: None  Last A1c:      Latest Ref Rng & Units 01/21/2023   10:57 AM  Hemoglobin A1C  Hemoglobin-A1c 4.6 - 6.5 % 5.7    Assessment:   1. Pain due to onychomycosis of toenails of both feet    Plan:  Patient was evaluated  and treated. All patient's and/or POA's questions/concerns addressed on today's visit. Toenails 2-5 bilaterally debrided in length and girth without incident. Continue soft, supportive shoe gear daily. Report any pedal injuries to medical professional. Call office if there are any questions/concerns. -No invasive procedure(s) performed. Offending nail border debrided and curretaged both borders of left hallux and both borders of right hallux utilizing sterile nail nipper and currette. Border(s) cleansed with alcohol and triple antibiotic ointment applied. Patient/POA/Caregiver/Facility instructed to apply triple antibiotic ointment  to bilateral great toes once daily for 7 days. Call office if there are any concerns. -Patient/POA to call should there be question/concern in the interim.  Return in about 3 months (around 09/09/2023).  Delon CROME Merlin, DPM      [place] LOCATION: [address] [place] [address]                   Office [phone]   [facility] LOCATION: [address] [place], [place] [address] Office [phone]

## 2023-06-25 ENCOUNTER — Telehealth: Payer: Self-pay

## 2023-06-25 NOTE — Telephone Encounter (Signed)
 Spoke with pt's mother today and she stated that she is having a hard time with the social sercurity office. Mother stated that they are sending her form to fill out in regards to her money. Mother stated that the pt does not live with her and takes care of her own finances. Pt's mother would like to see if you could type up a letter for her stating that pt is a responsible person and able to take care of her own finances.

## 2023-06-27 ENCOUNTER — Telehealth: Payer: Self-pay | Admitting: Internal Medicine

## 2023-06-27 NOTE — Telephone Encounter (Signed)
 Pt's mother is aware that the letter is ready for pick up. I have placed the letter up front in accordion folder for pick up.

## 2023-07-07 ENCOUNTER — Other Ambulatory Visit: Payer: Self-pay | Admitting: Internal Medicine

## 2023-07-07 DIAGNOSIS — Z1231 Encounter for screening mammogram for malignant neoplasm of breast: Secondary | ICD-10-CM

## 2023-07-14 DIAGNOSIS — H5316 Psychophysical visual disturbances: Secondary | ICD-10-CM | POA: Diagnosis not present

## 2023-07-14 DIAGNOSIS — Z961 Presence of intraocular lens: Secondary | ICD-10-CM | POA: Diagnosis not present

## 2023-07-14 DIAGNOSIS — H5500 Unspecified nystagmus: Secondary | ICD-10-CM | POA: Diagnosis not present

## 2023-07-14 DIAGNOSIS — H3552 Pigmentary retinal dystrophy: Secondary | ICD-10-CM | POA: Diagnosis not present

## 2023-07-21 ENCOUNTER — Ambulatory Visit (INDEPENDENT_AMBULATORY_CARE_PROVIDER_SITE_OTHER): Payer: PPO | Admitting: Internal Medicine

## 2023-07-21 ENCOUNTER — Encounter: Payer: Self-pay | Admitting: Internal Medicine

## 2023-07-21 VITALS — BP 130/68 | HR 65 | Ht 63.0 in | Wt 182.8 lb

## 2023-07-21 DIAGNOSIS — R1310 Dysphagia, unspecified: Secondary | ICD-10-CM | POA: Insufficient documentation

## 2023-07-21 DIAGNOSIS — J301 Allergic rhinitis due to pollen: Secondary | ICD-10-CM

## 2023-07-21 DIAGNOSIS — Z780 Asymptomatic menopausal state: Secondary | ICD-10-CM

## 2023-07-21 DIAGNOSIS — R5383 Other fatigue: Secondary | ICD-10-CM

## 2023-07-21 DIAGNOSIS — Z0000 Encounter for general adult medical examination without abnormal findings: Secondary | ICD-10-CM | POA: Diagnosis not present

## 2023-07-21 DIAGNOSIS — H3552 Pigmentary retinal dystrophy: Secondary | ICD-10-CM

## 2023-07-21 DIAGNOSIS — R1312 Dysphagia, oropharyngeal phase: Secondary | ICD-10-CM | POA: Diagnosis not present

## 2023-07-21 DIAGNOSIS — R7301 Impaired fasting glucose: Secondary | ICD-10-CM

## 2023-07-21 DIAGNOSIS — E782 Mixed hyperlipidemia: Secondary | ICD-10-CM | POA: Diagnosis not present

## 2023-07-21 DIAGNOSIS — E663 Overweight: Secondary | ICD-10-CM

## 2023-07-21 DIAGNOSIS — H903 Sensorineural hearing loss, bilateral: Secondary | ICD-10-CM | POA: Diagnosis not present

## 2023-07-21 DIAGNOSIS — J309 Allergic rhinitis, unspecified: Secondary | ICD-10-CM | POA: Insufficient documentation

## 2023-07-21 LAB — CBC WITH DIFFERENTIAL/PLATELET
Basophils Absolute: 0 10*3/uL (ref 0.0–0.1)
Basophils Relative: 0.3 % (ref 0.0–3.0)
Eosinophils Absolute: 0.1 10*3/uL (ref 0.0–0.7)
Eosinophils Relative: 1.2 % (ref 0.0–5.0)
HCT: 39.2 % (ref 36.0–46.0)
Hemoglobin: 13 g/dL (ref 12.0–15.0)
Lymphocytes Relative: 36 % (ref 12.0–46.0)
Lymphs Abs: 1.9 10*3/uL (ref 0.7–4.0)
MCHC: 33.1 g/dL (ref 30.0–36.0)
MCV: 92.6 fl (ref 78.0–100.0)
Monocytes Absolute: 0.4 10*3/uL (ref 0.1–1.0)
Monocytes Relative: 8.5 % (ref 3.0–12.0)
Neutro Abs: 2.8 10*3/uL (ref 1.4–7.7)
Neutrophils Relative %: 54 % (ref 43.0–77.0)
Platelets: 323 10*3/uL (ref 150.0–400.0)
RBC: 4.24 Mil/uL (ref 3.87–5.11)
RDW: 12.8 % (ref 11.5–15.5)
WBC: 5.2 10*3/uL (ref 4.0–10.5)

## 2023-07-21 LAB — COMPREHENSIVE METABOLIC PANEL
ALT: 20 U/L (ref 0–35)
AST: 27 U/L (ref 0–37)
Albumin: 4.1 g/dL (ref 3.5–5.2)
Alkaline Phosphatase: 76 U/L (ref 39–117)
BUN: 16 mg/dL (ref 6–23)
CO2: 29 meq/L (ref 19–32)
Calcium: 9.3 mg/dL (ref 8.4–10.5)
Chloride: 102 meq/L (ref 96–112)
Creatinine, Ser: 0.71 mg/dL (ref 0.40–1.20)
GFR: 87.33 mL/min (ref >60.00)
Glucose, Bld: 83 mg/dL (ref 70–99)
Potassium: 4.7 meq/L (ref 3.5–5.1)
Sodium: 139 meq/L (ref 135–145)
Total Bilirubin: 0.4 mg/dL (ref 0.2–1.2)
Total Protein: 7.1 g/dL (ref 6.0–8.3)

## 2023-07-21 LAB — LIPID PANEL
Cholesterol: 212 mg/dL — ABNORMAL HIGH (ref 0–200)
HDL: 70.8 mg/dL (ref >39.00)
LDL Cholesterol: 113 mg/dL — ABNORMAL HIGH (ref 0–99)
NonHDL: 141.69
Total CHOL/HDL Ratio: 3
Triglycerides: 142 mg/dL (ref 0.0–149.0)
VLDL: 28.4 mg/dL (ref 0.0–40.0)

## 2023-07-21 LAB — LDL CHOLESTEROL, DIRECT: Direct LDL: 124 mg/dL

## 2023-07-21 LAB — TSH: TSH: 2.41 u[IU]/mL (ref 0.35–5.50)

## 2023-07-21 LAB — HEMOGLOBIN A1C: Hgb A1c MFr Bld: 5.5 % (ref 4.6–6.5)

## 2023-07-21 MED ORDER — FLUTICASONE PROPIONATE 50 MCG/ACT NA SUSP: 2.0000 | Freq: Every day | NASAL | 6 refills | Status: DC

## 2023-07-21 MED ORDER — ESTRADIOL 1 MG PO TABS: 1.0000 mg | ORAL_TABLET | Freq: Every day | ORAL | 1 refills | Status: DC

## 2023-07-21 NOTE — Assessment & Plan Note (Signed)

## 2023-07-21 NOTE — Assessment & Plan Note (Signed)
 Already taking fexogenadine.  Adding flonase

## 2023-07-21 NOTE — Assessment & Plan Note (Signed)
 Symptoms have been present but progressive fr "years".  DG esophagus ordered

## 2023-07-21 NOTE — Assessment & Plan Note (Signed)
I have addressed  BMI and recommended wt loss of 10% of body weight over the next 6 months using a low glycemic index diet and regular exercise a minimum of 5 days per week.   

## 2023-07-21 NOTE — Patient Instructions (Addendum)
 Increase the allegra to twice daily,  allegra is available  in the generic  form as "fexofenadine "  I am adding a  steroid nasal spray (Flonase ) which is available OTC   Do this at night  because the pollen is secreted overnight by plants.    Swallow evaluation is needed and has been ordered    Keep working on the weight loss.  Increase exercise to 30 mintues daily

## 2023-07-21 NOTE — Assessment & Plan Note (Signed)
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  Employs palpable sign language for communication  

## 2023-07-21 NOTE — Progress Notes (Signed)
 Patient ID: Helen Mccoy, female    DOB: December 08, 1955  Age: 68 y.o. MRN: [id number]  The patient is here for annual preventive examination and management of other chronic and acute problems.   The risk factors are reflected in the social history.   The roster of all physicians providing medical care to patient - is listed in the Snapshot section of the chart.   Activities of daily living:  The patient is 100% independent in all ADLs: dressing, toileting, feeding as well as independent mobility   Home safety : The patient has smoke detectors in the home. They wear seatbelts.  There are no unsecured firearms at home. There is no violence in the home.    There is no risks for hepatitis, STDs or HIV. There is no   history of blood transfusion. They have no travel history to infectious disease endemic areas of the world.   The patient has seen their dentist in the last six month. She is legally blind and deaf  She does not  have excessive sun exposure. Discussed the need for sun protection: hats, long sleeves and use of sunscreen if there is significant sun exposure.    Diet: the importance of a healthy diet is discussed.  She has a   a healthy diet.   The benefits of regular aerobic exercise were discussed. The patient  is not exercising regularly .    Depression screen: there are no signs or vegative symptoms of depression- irritability, change in appetite, anhedonia, sadness/tearfullness.   The following portions of the patient's history were reviewed and updated as appropriate: allergies, current medications, past family history, past medical history,  past surgical history, past social history  and problem list.   Visual acuity was not assessed per patient preference since the patient has regular follow up with an  ophthalmologist. Hearing and body mass index were assessed and reviewed.    During the course of the visit the patient was educated and counseled about appropriate screening  and preventive services including : fall prevention , diabetes screening, nutrition counseling, colorectal cancer screening, and recommended immunizations.    Chief Complaint:  She reports dysphagia  for pills and occasionally food feels like it is going down the wrong way and she coughs it back up..  denies regurgitation   Lots of post nasal drip especially at night  feels like the mucous is making her throat close up .  Does n  but uses azelastine for eyes,  and allegra once daily in the morning.      Review of Symptoms  Patient denies headache, fevers, malaise, unintentional weight loss, skin rash, eye pain, sinus congestion and sinus pain, sore throat, dysphagia,  hemoptysis , cough, dyspnea, wheezing, chest pain, palpitations, orthopnea, edema, abdominal pain, nausea, melena, diarrhea, constipation, flank pain, dysuria, hematuria, urinary  Frequency, nocturia, numbness, tingling, seizures,  Focal weakness, Loss of consciousness,  Tremor, insomnia, depression, anxiety, and suicidal ideation.    Physical Exam:  BP 130/68   Pulse 65   Ht 5\' 3"  (1.6 m)   Wt 182 lb 12.8 oz (82.9 kg)   SpO2 98%   BMI 32.38 kg/m    Physical Exam Vitals reviewed.  Constitutional:      General: She is not in acute distress.    Appearance: Normal appearance. She is normal weight. She is not ill-appearing, toxic-appearing or diaphoretic.  HENT:     Head: Normocephalic.  Eyes:     General: No scleral icterus.  Right eye: No discharge.        Left eye: No discharge.     Conjunctiva/sclera: Conjunctivae normal.  Cardiovascular:     Rate and Rhythm: Normal rate and regular rhythm.     Heart sounds: Normal heart sounds.  Pulmonary:     Effort: Pulmonary effort is normal. No respiratory distress.     Breath sounds: Normal breath sounds.  Musculoskeletal:        General: Normal range of motion.  Skin:    General: Skin is warm and dry.  Neurological:     General: No focal deficit present.      Mental Status: She is alert and oriented to person, place, and time. Mental status is at baseline.  Psychiatric:        Mood and Affect: Mood normal.        Behavior: Behavior normal.        Thought Content: Thought content normal.        Judgment: Judgment normal.    Assessment and Plan: Postmenopausal estrogen deficiency -     DG Bone Density; Future  Moderate mixed hyperlipidemia not requiring statin therapy -     Lipid panel -     LDL cholesterol, direct  Impaired fasting glucose -     Comprehensive metabolic panel -     Hemoglobin A1c  Other fatigue -     TSH -     CBC with Differential/Platelet  Oropharyngeal dysphagia Assessment & Plan: Symptoms have been present but progressive fr "years".  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  Employs palpable sign language for communication    Overweight (BMI 25.0-29.9) Assessment & Plan: I have addressed  BMI and recommended wt loss of 10% of body weight over the next 6 months using a low glycemic index diet and regular exercise a minimum of 5 days per week.     Encounter for preventive health examination Assessment & Plan: age appropriate education and counseling updated, referrals for preventative services and immunizations addressed, dietary and smoking counseling addressed, most recent labs reviewed.  I have personally reviewed and have noted:   1) the patient's medical and social history 2) The pt's use of alcohol, tobacco, and illicit drugs 3) The patient's current medications and supplements 4) Functional ability including ADL's, fall risk, home safety risk, hearing and visual impairment 5) Diet and physical activities 6) Evidence for depression or mood disorder 7) The patient's height, weight, and BMI have been  recorded in the chart   I have made referrals, and provided counseling and education based on review of the above    Seasonal allergic rhinitis due to pollen Assessment & Plan: Already taking fexogenadine.  Adding flonase    Other orders -     Estradiol; Take 1 tablet (1 mg total) by mouth daily.  Dispense: 90 tablet; Refill: 1 -     Fluticasone Propionate; Place 2 sprays into both nostrils daily.  Dispense: 16 g; Refill: 6    Return in about 3 months (around 10/21/2023) for follow up on swallowing issues .  Sherlene Shams, MD

## 2023-07-22 NOTE — Telephone Encounter (Signed)
 Patient seen.

## 2023-08-13 ENCOUNTER — Ambulatory Visit
Admission: RE | Admit: 2023-08-13 | Discharge: 2023-08-13 | Disposition: A | Discharge status: Home / Self Care | Source: Ambulatory Visit | Attending: Internal Medicine | Admitting: Internal Medicine

## 2023-08-13 DIAGNOSIS — Z1231 Encounter for screening mammogram for malignant neoplasm of breast: Secondary | ICD-10-CM | POA: Diagnosis not present

## 2023-09-18 ENCOUNTER — Encounter: Payer: Self-pay | Admitting: Podiatry

## 2023-09-18 ENCOUNTER — Ambulatory Visit: Payer: PPO | Admitting: Podiatry

## 2023-09-18 DIAGNOSIS — M79675 Pain in left toe(s): Secondary | ICD-10-CM

## 2023-09-18 DIAGNOSIS — B351 Tinea unguium: Secondary | ICD-10-CM | POA: Diagnosis not present

## 2023-09-18 DIAGNOSIS — M79674 Pain in right toe(s): Secondary | ICD-10-CM

## 2023-09-18 NOTE — Progress Notes (Signed)
  Subjective:  Patient ID: Helen Mccoy, female    DOB: 1955/08/25,  MRN: [id number]  Patient seen with assistance of virtual sign language interpreter on today's visit.   68 y.o. female presents to clinic with  painful, elongated thickened toenails x 10 which are symptomatic when wearing enclosed shoe gear. This interferes with his/her daily activities.  Chief Complaint  Patient presents with   Nail Problem    "Clip her nails."    New problem(s): None   PCP is Thersia Flax, MD.  Allergies  Allergen Reactions   Sulfa Antibiotics Hives and Nausea And Vomiting    Review of Systems: Negative except as noted in the HPI.   Objective:  Helen Mccoy is a pleasant 68 y.o. female WD, WN in NAD. AAO x 3.  Vascular Examination: Vascular status intact b/l with palpable pedal pulses. CFT immediate b/l. No edema. No pain with calf compression b/l. Skin temperature gradient WNL b/l. No cyanosis or clubbing noted b/l LE.  Neurological Examination: Sensation grossly intact b/l with 10 gram monofilament.   Dermatological Examination: Pedal skin with normal turgor, texture and tone b/l. Toenails 1-5 b/l thick, discolored, elongated with subungual debris and pain on dorsal palpation. No hyperkeratotic lesions noted b/l.   Musculoskeletal Examination: Muscle strength 5/5 to b/l LE. No pain, crepitus or joint limitation noted with ROM bilateral LE. 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  Radiographs: None  Last A1c:      Latest Ref Rng & Units 07/21/2023    9:25 AM 01/21/2023   10:57 AM  Hemoglobin A1C  Hemoglobin-A1c 4.6 - 6.5 % 5.5  5.7      Assessment:   1. Pain due to onychomycosis of toenails of both feet    Plan:  -Patient's primary language is tactile sign language. Patient seen with assistance of Virtual Interpreter on today's visit.  -Consent given for treatment as described below: -Examined patient. -Mycotic toenails 1-5 bilaterally were  debrided in length and girth with sterile nail nippers and dremel without incident. -Patient/POA to call should there be question/concern in the interim.  Return in about 3 months (around 12/19/2023).  Luella Sager, DPM      [place] LOCATION: [address], [place] [address]                   Office [phone]   [facility] LOCATION: [address] [place], [place] [address] Office [phone]

## 2023-10-22 ENCOUNTER — Ambulatory Visit (INDEPENDENT_AMBULATORY_CARE_PROVIDER_SITE_OTHER): Admitting: Internal Medicine

## 2023-10-22 ENCOUNTER — Encounter: Payer: Self-pay | Admitting: Internal Medicine

## 2023-10-22 ENCOUNTER — Other Ambulatory Visit: Payer: Self-pay

## 2023-10-22 VITALS — BP 108/62 | HR 63 | Ht 63.0 in | Wt 181.0 lb

## 2023-10-22 DIAGNOSIS — J301 Allergic rhinitis due to pollen: Secondary | ICD-10-CM | POA: Diagnosis not present

## 2023-10-22 DIAGNOSIS — Z780 Asymptomatic menopausal state: Secondary | ICD-10-CM

## 2023-10-22 MED ORDER — OMEPRAZOLE 20 MG PO CPDR: 20.0000 mg | DELAYED_RELEASE_CAPSULE | Freq: Every day | ORAL | 1 refills | Status: DC

## 2023-10-22 NOTE — Progress Notes (Signed)
 Subjective:  Patient ID: Helen Mccoy, female    DOB: 1955/05/12  Age: 68 y.o. MRN: [id number]  CC: The primary encounter diagnosis was Postmenopausal estrogen deficiency. A diagnosis of Seasonal allergic rhinitis due to pollen was also pertinent to this visit.   HPI Helen Mccoy presents for  Chief Complaint  Patient presents with   Medical Management of Chronic Issues    3 month follow up     Patient's translator arrived 20 minutes late for appt .    Helen Mccoy She continues to be bothered by persistent thick Post nasal drainage making her cough,  happens every spring/summer, worse in the morning upon waking. . No fevers  .  Denies trouble swallowing pills.  Did not have the DG esophagus  because she felt that the dysphagia is due to excess mucus. She is  Using azelastine  and  flonase  but has not tried using mucinex    Outpatient Medications Prior to Visit  Medication Sig Dispense Refill   azelastine  (OPTIVAR ) 0.05 % ophthalmic solution Apply 1 drop to eye 2 (two) times daily. 6 mL 12   Calcium Carbonate-Vitamin D  500-125 MG-UNIT TABS Take 1 tablet by mouth daily.     estradiol  (ESTRACE ) 1 MG tablet Take 1 tablet (1 mg total) by mouth daily. 90 tablet 1   fluticasone  (FLONASE ) 50 MCG/ACT nasal spray Place 2 sprays into both nostrils daily. 16 g 6   omeprazole  (PRILOSEC) 20 MG capsule Take 1 capsule (20 mg total) by mouth daily. 30 capsule 3   diclofenac  Sodium (VOLTAREN ) 1 % GEL APPLY 2G TOPICALLY FOUR TIMES DAILY TO THUMB JOINT (Patient not taking: Reported on 10/22/2023) 100 g 5   spironolactone  (ALDACTONE ) 25 MG tablet Take 1 tablet (25 mg total) by mouth daily. 90 tablet 1   No facility-administered medications prior to visit.    Review of Systems;  Patient denies headache, fevers, malaise, unintentional weight loss, skin rash, eye pain, sinus congestion and sinus pain, sore throat, dysphagia,  hemoptysis , cough, dyspnea, wheezing, chest pain, palpitations,  orthopnea, edema, abdominal pain, nausea, melena, diarrhea, constipation, flank pain, dysuria, hematuria, urinary  Frequency, nocturia, numbness, tingling, seizures,  Focal weakness, Loss of consciousness,  Tremor, insomnia, depression, anxiety, and suicidal ideation.      Objective:  BP 108/62   Pulse 63   Ht 5' 3 (1.6 m)   Wt 181 lb (82.1 kg)   SpO2 97%   BMI 32.06 kg/m   BP Readings from Last 3 Encounters:  10/22/23 108/62  07/21/23 130/68  01/21/23 118/60    Wt Readings from Last 3 Encounters:  10/22/23 181 lb (82.1 kg)  07/21/23 182 lb 12.8 oz (82.9 kg)  06/12/23 179 lb 9.6 oz (81.5 kg)    Physical Exam Vitals reviewed.  Constitutional:      General: She is not in acute distress.    Appearance: Normal appearance. She is normal weight. She is not ill-appearing, toxic-appearing or diaphoretic.  HENT:     Head: Normocephalic.     Right Ear: Tympanic membrane normal.     Left Ear: Tympanic membrane normal.     Nose: Nose normal.     Mouth/Throat:     Pharynx: Oropharynx is clear. No oropharyngeal exudate or posterior oropharyngeal erythema.   Eyes:     General: No scleral icterus.       Right eye: No discharge.        Left eye: No discharge.     Conjunctiva/sclera: Conjunctivae normal.  Pupils: Pupils are equal, round, and reactive to light.    Cardiovascular:     Rate and Rhythm: Normal rate and regular rhythm.     Heart sounds: Normal heart sounds.  Pulmonary:     Effort: Pulmonary effort is normal. No respiratory distress.     Breath sounds: Normal breath sounds.   Musculoskeletal:        General: Normal range of motion.     Cervical back: Normal range of motion and neck supple.  Lymphadenopathy:     Cervical: No cervical adenopathy.   Skin:    General: Skin is warm and dry.   Neurological:     General: No focal deficit present.     Mental Status: She is alert and oriented to person, place, and time. Mental status is at baseline.    Psychiatric:        Mood and Affect: Mood normal.        Behavior: Behavior normal.        Thought Content: Thought content normal.        Judgment: Judgment normal.    Lab Results  Component Value Date   HGBA1C 5.5 07/21/2023   HGBA1C 5.7 01/21/2023   HGBA1C 5.7 06/03/2022    Lab Results  Component Value Date   CREATININE 0.71 07/21/2023   CREATININE 0.81 01/21/2023   CREATININE 0.72 06/03/2022    Lab Results  Component Value Date   WBC 5.2 07/21/2023   HGB 13.0 07/21/2023   HCT 39.2 07/21/2023   PLT 323.0 07/21/2023   GLUCOSE 83 07/21/2023   CHOL 212 (H) 07/21/2023   TRIG 142.0 07/21/2023   HDL 70.80 07/21/2023   LDLDIRECT 124.0 07/21/2023   LDLCALC 113 (H) 07/21/2023   ALT 20 07/21/2023   AST 27 07/21/2023   NA 139 07/21/2023   K 4.7 07/21/2023   CL 102 07/21/2023   CREATININE 0.71 07/21/2023   BUN 16 07/21/2023   CO2 29 07/21/2023   TSH 2.41 07/21/2023   HGBA1C 5.5 07/21/2023    MM 3D SCREENING MAMMOGRAM BILATERAL BREAST Result Date: 08/15/2023 CLINICAL DATA:  Screening. EXAM: DIGITAL SCREENING BILATERAL MAMMOGRAM WITH TOMOSYNTHESIS AND CAD TECHNIQUE: Bilateral screening digital craniocaudal and mediolateral oblique mammograms were obtained. Bilateral screening digital breast tomosynthesis was performed. The images were evaluated with computer-aided detection. COMPARISON:  Previous exam(s). ACR Breast Density Category b: There are scattered areas of fibroglandular density. FINDINGS: There are no findings suspicious for malignancy. IMPRESSION: No mammographic evidence of malignancy. A result letter of this screening mammogram will be mailed directly to the patient. RECOMMENDATION: Screening mammogram in one year. (Code:SM-B-01Y) BI-RADS CATEGORY  1: Negative. Electronically Signed   By: Dina  Arceo M.D.   On: 08/15/2023 12:43    Assessment & Plan:  .Postmenopausal estrogen deficiency -     DG Bone Density; Future  Seasonal allergic rhinitis due to  pollen Assessment & Plan: Adding mucinex and allegra  to flonase .  If no improvement  will recommend she see ENT       I spent 34 minutes on the day of this face to face encounter reviewing patient's  most recent visit with cardiology,  nephrology,  and neurology,  prior relevant surgical and non surgical procedures, recent  labs and imaging studies, counseling on weight management,  reviewing the assessment and plan with patient, and post visit ordering and reviewing of  diagnostics and therapeutics with patient  .   Follow-up: Return in about 6 months (around 04/22/2024).   Ray Caffey  Madelon Scheuermann, MD

## 2023-10-22 NOTE — Patient Instructions (Addendum)
 Try adding mucinex DM  for  the thick  nasal mucus  and the cough until we get your allergies controlled.   You should  be taking an antihistamine along with the flonase  every day   (if you want to use claritin  or allegra  ,  they are available for much less $$$  in the generic form from the [employer] brand as loratadine and fexofenadine   available from [employer] brand )   You can also try drinking hot tea with fresh lemon juice to help get rid of the mucus is in the back of your throat ,   If this combination does not help,  I can refer you to an Ear nose and throat specialist

## 2023-10-22 NOTE — Addendum Note (Signed)
 Addended by: Thersia Flax on: 10/22/2023 11:59 AM   Modules accepted: Orders

## 2023-10-22 NOTE — Assessment & Plan Note (Signed)
 Adding mucinex and allegra  to flonase .  If no improvement  will recommend she see ENT

## 2023-10-31 DIAGNOSIS — Z7382 Dual sensory impairment: Secondary | ICD-10-CM | POA: Diagnosis not present

## 2023-10-31 DIAGNOSIS — M654 Radial styloid tenosynovitis [de Quervain]: Secondary | ICD-10-CM | POA: Diagnosis not present

## 2023-12-19 ENCOUNTER — Ambulatory Visit: Admitting: Podiatry

## 2023-12-19 DIAGNOSIS — B351 Tinea unguium: Secondary | ICD-10-CM | POA: Diagnosis not present

## 2023-12-19 DIAGNOSIS — M79674 Pain in right toe(s): Secondary | ICD-10-CM

## 2023-12-19 DIAGNOSIS — L600 Ingrowing nail: Secondary | ICD-10-CM | POA: Diagnosis not present

## 2023-12-19 DIAGNOSIS — M79675 Pain in left toe(s): Secondary | ICD-10-CM

## 2023-12-24 DIAGNOSIS — M5416 Radiculopathy, lumbar region: Secondary | ICD-10-CM | POA: Diagnosis not present

## 2023-12-24 DIAGNOSIS — M7062 Trochanteric bursitis, left hip: Secondary | ICD-10-CM | POA: Diagnosis not present

## 2023-12-24 DIAGNOSIS — M7072 Other bursitis of hip, left hip: Secondary | ICD-10-CM | POA: Diagnosis not present

## 2023-12-24 DIAGNOSIS — M47816 Spondylosis without myelopathy or radiculopathy, lumbar region: Secondary | ICD-10-CM | POA: Diagnosis not present

## 2023-12-25 ENCOUNTER — Encounter: Payer: Self-pay | Admitting: Podiatry

## 2023-12-25 NOTE — Progress Notes (Signed)
  Subjective:  Patient ID: Helen Mccoy, female    DOB: 1955-12-19,  MRN: [id number]  Helen Mccoy presents to clinic today for painful thick toenails that are difficult to trim. Pain interferes with ambulation. Aggravating factors include wearing enclosed shoe gear. Pain is relieved with periodic professional debridement.  Patient is accompanied by tactile sign language interpreter, Duwaine, on today's visit. Chief Complaint  Patient presents with   Nail Problem    Thick painful toenails, 3 month follow up    New problem(s): Patient states she has some discomfort of left great toe lateral border. Denies any drainage  PCP is [name] Verneita CROME, MD. Helen Mccoy 10/22/2023.  Allergies  Allergen Reactions   Sulfa Antibiotics Hives and Nausea And Vomiting   Review of Systems: Negative except as noted in the HPI.  Objective:  There were no vitals filed for this visit. Helen Mccoy is a pleasant 68 y.o. female WD, WN in NAD. AAO x 3.   Vascular Examination: Vascular status intact b/l with palpable pedal pulses. CFT immediate b/l. No edema. No pain with calf compression b/l. Skin temperature gradient WNL b/l. No cyanosis or clubbing noted b/l LE.  Neurological Examination: Sensation grossly intact b/l with 10 gram monofilament.   Dermatological Examination: Pedal skin with normal turgor, texture and tone b/l. Toenails 1-5 b/l thick, discolored, elongated with subungual debris and pain on dorsal palpation. No hyperkeratotic lesions noted b/l.   Incurvated nailplate left great toe lateral border(s) with tenderness to palpation. No erythema, no edema, no drainage noted.  Musculoskeletal Examination: Muscle strength 5/5 to b/l LE. No pain, crepitus or joint limitation noted with ROM bilateral LE. 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  Radiographs: None  Assessment/Plan: 1. Pain due to onychomycosis of toenails of both feet   2. Ingrown toenail without infection     -Patient's primary language is tactile sign language. Patient seen with assistance of tactile sign language interpreter. -Patient was evaluated today. All questions/concerns addressed on today's visit. -Toenails were debrided in length and girth 2-5 bilaterally and right great toe with sterile nail nippers and dremel without iatrogenic bleeding.  -No invasive procedure(s) performed. Offending nail border debrided and curretaged lateral border left hallux utilizing sterile nail nipper and currette. Border cleansed with alcohol and triple antibiotic ointment applied. No further treatment required by patient/caregiver. Discussed procedures for chronic ingrown toenail. She may see Dr. Tobie if  condition worsens. Call office if there are any concerns. -Patient/POA to call should there be question/concern in the interim.   Return in about 3 months (around 03/20/2024).  Delon CROME Merlin, DPM      [place] LOCATION: [address] [place] [address]                   Office [phone]   [facility] LOCATION: [address] [place], [place] [address] Office [phone]

## 2024-02-09 ENCOUNTER — Ambulatory Visit: Payer: Self-pay

## 2024-02-09 DIAGNOSIS — G8929 Other chronic pain: Secondary | ICD-10-CM

## 2024-02-09 NOTE — Telephone Encounter (Signed)
 Spoke with pt's mother and scheduled her for first available on 03/05/2024.

## 2024-02-09 NOTE — Telephone Encounter (Signed)
 FYI Only or Action Required?: FYI only for provider.  Patient was last seen in primary care on 10/22/2023 by Helen Verneita CROME, MD.  Called Nurse Triage reporting Leg Pain.  Symptoms began several months ago.  Interventions attempted: Nothing.  Symptoms are: gradually worsening.  Triage Disposition: See PCP Within 2 Weeks  Patient/caregiver understands and will follow disposition?: No, wishes to speak with PCP- Mother declined earlier appts with different providers. Prefers to see Dr. [name] only. Appt scheduled for 11/5 at 1030   Copied from CRM #[id number]. Topic: Clinical - Red Word Triage >> Feb 09, 2024  9:01 AM Viola F wrote: Patient having pain in left leg, daughter [name] requesting appt Reason for Disposition  Leg pain or muscle cramp is a chronic symptom (recurrent or ongoing AND present > 4 weeks)  Answer Assessment - Initial Assessment Questions 1. ONSET: When did the pain start?      Several months, 6-8  2. LOCATION: Where is the pain located?      Left hip down leg  3. PAIN: How bad is the pain?    (Scale 1-10; or mild, moderate, severe)     Moderate pain, having trouble sleeping  4. WORK OR EXERCISE: Has there been any recent work or exercise that involved this part of the body?      No  5. CAUSE: What do you think is causing the leg pain?     Unsure of cause  6. OTHER SYMPTOMS: Do you have any other symptoms? (e.g., chest pain, back pain, breathing difficulty, swelling, rash, fever, numbness, weakness)     No   7. PREGNANCY: Is there any chance you are pregnant? When was your last menstrual period?     No  Protocols used: Leg Pain-A-AH

## 2024-02-10 NOTE — Telephone Encounter (Signed)
 Spoke with pt's mother and she gave a verbal understanding.

## 2024-02-11 NOTE — Telephone Encounter (Unsigned)
 Copied from CRM #[id number]. Topic: General - Other >> Feb 11, 2024 10:45 AM Thersia BROCKS wrote: Reason for CRM: Patient mother , called in would like a call back from [facility]  [phone]

## 2024-02-11 NOTE — Telephone Encounter (Signed)
 LMTCB. Please let pt's mother know that pt does not need to do the xray again and she does not need to follow up with Dr. [name] they need to follow up with Dr. Krasinki.

## 2024-02-16 ENCOUNTER — Other Ambulatory Visit: Payer: Self-pay

## 2024-02-16 MED ORDER — ESTRADIOL 1 MG PO TABS: 1.0000 mg | ORAL_TABLET | Freq: Every day | ORAL | 1 refills | Status: AC

## 2024-02-19 NOTE — Telephone Encounter (Signed)
 Pt is scheduled to see Dr. Marchia tomorrow.

## 2024-02-20 DIAGNOSIS — M5416 Radiculopathy, lumbar region: Secondary | ICD-10-CM | POA: Diagnosis not present

## 2024-02-20 DIAGNOSIS — M7062 Trochanteric bursitis, left hip: Secondary | ICD-10-CM | POA: Diagnosis not present

## 2024-03-05 ENCOUNTER — Ambulatory Visit: Admitting: Internal Medicine

## 2024-03-10 ENCOUNTER — Ambulatory Visit: Admitting: Internal Medicine

## 2024-03-19 ENCOUNTER — Ambulatory Visit: Admitting: Podiatry

## 2024-03-19 ENCOUNTER — Encounter: Payer: Self-pay | Admitting: Podiatry

## 2024-03-19 VITALS — Ht 63.0 in | Wt 181.0 lb

## 2024-03-19 DIAGNOSIS — B351 Tinea unguium: Secondary | ICD-10-CM

## 2024-03-19 DIAGNOSIS — L600 Ingrowing nail: Secondary | ICD-10-CM

## 2024-03-19 DIAGNOSIS — M79675 Pain in left toe(s): Secondary | ICD-10-CM

## 2024-03-19 DIAGNOSIS — M79674 Pain in right toe(s): Secondary | ICD-10-CM

## 2024-03-20 ENCOUNTER — Other Ambulatory Visit: Payer: Self-pay | Admitting: Internal Medicine

## 2024-03-28 NOTE — Progress Notes (Signed)
  Subjective:  Patient ID: Helen Mccoy, female    DOB: 1955/08/01,  MRN: [id number]  Helen Mccoy presents to clinic today for thick, elongated toenails of both feet which are tender when wearing enclosed shoe gear. She is accompanied by her Mother who serves as tactile sign language interpreter on today's visit. Chief Complaint  Patient presents with   Nail Problem    Pt is here for [facility], PCP is Dr [name] and LOV was in June.   New problem(s): None.   PCP is [name] Verneita CROME, MD.  Allergies  Allergen Reactions   Sulfa Antibiotics Hives and Nausea And Vomiting    Review of Systems: Negative except as noted in the HPI.  Objective: No changes noted in today's physical examination. There were no vitals filed for this visit. Helen Mccoy is a pleasant 68 y.o. female WD, WN in NAD. AAO x 3.  Vascular Examination: Capillary refill time immediate b/l. Palpable pedal pulses. Pedal hair present b/l. Pedal edema absent. No pain with calf compression b/l. Skin temperature gradient WNL b/l. No cyanosis or clubbing b/l. No ischemia or gangrene noted b/l.   Neurological Examination: Sensation grossly intact b/l with 10 gram monofilament.  Dermatological Examination: Pedal skin with normal turgor, texture and tone b/l.  No open wounds. No interdigital macerations.   Toenails 1-5 b/l thick, discolored, elongated with subungual debris and pain on dorsal palpation.   No corns, calluses, nor porokeratotic lesions.  Incurvated nailplate lateral border of R 2nd toe.  Nail border hypertrophy minimal. There is tenderness to palpation. Sign(s) of infection: no clinical signs of infection noted on examination today..  Musculoskeletal Examination: Muscle strength 5/5 to all lower extremity muscle groups bilaterally. 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  Radiographs: None  Last A1c:      Latest Ref Rng & Units 07/21/2023    9:25 AM  Hemoglobin A1C  Hemoglobin-A1c  4.6 - 6.5 % 5.5    Assessment/Plan: 1. Pain due to onychomycosis of toenails of both feet   2. Ingrown toenail without infection     -Patient's primary language is tactile sign language. Patient seen with assistance of tactile sign language interpreter. -Patient's family member present. All questions/concerns addressed on today's visit. -Consent given for treatment as described below: -Patient to continue soft, supportive shoe gear daily. -No invasive procedure(s) performed. Offending nail border debrided and curretaged lateral border of R 2nd toe utilizing sterile nail nipper and currette. Border(s) cleansed with alcohol and triple antibiotic ointment applied. Patient/POA/Caregiver/Facility instructed to apply Neosporin Cream  to R 2nd toe once daily for 7 days. Call office if there are any concerns. -Patient/POA to call should there be question/concern in the interim.   Return in about 3 months (around 06/19/2024).  Delon CROME Merlin, DPM      [place] LOCATION: [address] [place] [address]                   Office [phone]   [facility] LOCATION: [address] [place], [place] [address] Office [phone]

## 2024-04-21 ENCOUNTER — Other Ambulatory Visit: Payer: Self-pay | Admitting: Internal Medicine

## 2024-04-23 ENCOUNTER — Ambulatory Visit (INDEPENDENT_AMBULATORY_CARE_PROVIDER_SITE_OTHER): Admitting: Internal Medicine

## 2024-04-23 ENCOUNTER — Encounter: Payer: Self-pay | Admitting: Internal Medicine

## 2024-04-23 VITALS — BP 122/68 | HR 65 | Ht 63.0 in | Wt 164.2 lb

## 2024-04-23 DIAGNOSIS — H3552 Pigmentary retinal dystrophy: Secondary | ICD-10-CM

## 2024-04-23 DIAGNOSIS — E663 Overweight: Secondary | ICD-10-CM

## 2024-04-23 DIAGNOSIS — R7301 Impaired fasting glucose: Secondary | ICD-10-CM | POA: Diagnosis not present

## 2024-04-23 DIAGNOSIS — J301 Allergic rhinitis due to pollen: Secondary | ICD-10-CM | POA: Diagnosis not present

## 2024-04-23 DIAGNOSIS — H9190 Unspecified hearing loss, unspecified ear: Secondary | ICD-10-CM | POA: Diagnosis not present

## 2024-04-23 DIAGNOSIS — G8929 Other chronic pain: Secondary | ICD-10-CM

## 2024-04-23 DIAGNOSIS — E782 Mixed hyperlipidemia: Secondary | ICD-10-CM

## 2024-04-23 DIAGNOSIS — R5383 Other fatigue: Secondary | ICD-10-CM | POA: Diagnosis not present

## 2024-04-23 DIAGNOSIS — Q99819 Usher syndrome, unspecified: Secondary | ICD-10-CM

## 2024-04-23 DIAGNOSIS — M5416 Radiculopathy, lumbar region: Secondary | ICD-10-CM

## 2024-04-23 MED ORDER — AZELASTINE HCL 0.1 % NA SOLN: 1.0000 | Freq: Two times a day (BID) | NASAL | 12 refills | Status: AC

## 2024-04-23 NOTE — Patient Instructions (Addendum)
 I suggest taking 2 tablets of aleve  and 1000  mg tylenol at bedtime.  Try this for a week .  It can be repeated once during the day if needed   The medication you did not tolerate was prednisone      If she does not tolerate the aleve/tylenol combination,   we will have to try gabapentin or tramadol which have lots of side effects   Try placing a pillow between the knees; it helps keep side sleepers in good alignment and keeps you from crossing your legs which is BAD for your hips and back    I will order  the MRI.  If insurance will not pay without PT,   we will try to find another home health agency that will supply an interpretor. Ask your mother which agency was not able to help. ([facility], [facility])  2) FOR THE RUNNY NOSE:  CONTINUE FLONASE  ONCE DAILY  ADDING AZELASTINE  NASAL SPRAY TWICE Daily.  THIS IS AN ANTIHISTAMINE AND SHOULD HELP THE DRIP  3) CONGRATULATIONS! YOU HAVE LOST 17 LBS SINCE YOUR LAST VISIT

## 2024-04-23 NOTE — Progress Notes (Unsigned)
 "  Subjective:  Patient ID: Helen Mccoy, female    DOB: June 12, 1955  Age: 68 y.o. MRN: [id number]  CC: The primary encounter diagnosis was Non-seasonal allergic rhinitis due to pollen. Diagnoses of Moderate mixed hyperlipidemia not requiring statin therapy, Other fatigue, Impaired fasting glucose, Lumbar radiculopathy, right, Usher's syndrome, and Overweight (BMI 25.0-29.9) were also pertinent to this visit.   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  1) left LUMBAR RADICULOPATHY : she has been having  hip pain that radiates  to her left ankle that has been affecting her sleep .She received a hip injection over the summer by [facility]  which she states did not help.  PT was ordered but not done due to lack of available interpretor.  Has deferred follow up until after the holidays.  She is using Aleve one tablet  at night,  but wakes up at 3 am in a lot of pain .Helen Mccoy  Hip films were normal.  .  Worse in the morning but improves with activity.  Plain films suggest normal hip joint and  L5-S1 disk herniation with foraminal  narrowing . 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 USING FLONASE   Outpatient Medications Prior to Visit  Medication Sig Dispense Refill   azelastine  (OPTIVAR ) 0.05 % ophthalmic solution Apply 1 drop to eye 2 (two) times daily. 6 mL 12   Calcium Carbonate-Vitamin D  500-125 MG-UNIT TABS Take 1 tablet by mouth daily.     diclofenac  Sodium (VOLTAREN ) 1 % GEL APPLY 2G TOPICALLY FOUR TIMES DAILY TO THUMB JOINT 100 g 5   estradiol  (ESTRACE ) 1 MG tablet Take 1 tablet (1 mg total) by mouth daily. 90 tablet 1   fluticasone  (FLONASE ) 50  MCG/ACT nasal spray SHAKE LIQUID AND USE 2 SPRAYS IN EACH NOSTRIL DAILY 48 g 0   omeprazole  (PRILOSEC) 20 MG capsule TAKE 1 CAPSULE(20 MG) BY MOUTH DAILY 90 capsule 3   spironolactone  (ALDACTONE ) 25 MG tablet Take 1 tablet (25 mg total) by mouth daily. 90 tablet 1   No facility-administered medications prior to visit.    Review of Systems;  Patient denies headache, fevers, malaise, unintentional weight loss, skin rash, eye pain, sinus congestion and sinus pain, sore throat, dysphagia,  hemoptysis , cough, dyspnea, wheezing, chest pain, palpitations, orthopnea, edema, abdominal pain, nausea, melena, diarrhea, constipation, flank pain, dysuria, hematuria, urinary  Frequency, nocturia, numbness, tingling, seizures,  Focal weakness, Loss of consciousness,  Tremor, insomnia, depression, anxiety, and suicidal ideation.      Objective:  BP 122/68   Pulse 65   Ht 5' 3 (1.6 m)   Wt 164 lb 3.2 oz (74.5 kg)   SpO2 98%   BMI 29.09 kg/m   BP Readings from Last 3 Encounters:  04/23/24 122/68  10/22/23 108/62  07/21/23 130/68    Wt Readings from Last 3 Encounters:  04/23/24 164 lb 3.2 oz (74.5 kg)  03/19/24 181 lb (82.1 kg)  10/22/23 181 lb (82.1 kg)    Physical Exam Vitals reviewed.  Constitutional:      General: She is not in acute distress.  Appearance: Normal appearance. She is normal weight. She is not ill-appearing, toxic-appearing or diaphoretic.  HENT:     Head: Normocephalic.  Eyes:     General: No scleral icterus.       Right eye: No discharge.        Left eye: No discharge.     Conjunctiva/sclera: Conjunctivae normal.  Cardiovascular:     Rate and Rhythm: Normal rate and regular rhythm.     Heart sounds: Normal heart sounds.  Pulmonary:     Effort: Pulmonary effort is normal. No respiratory distress.     Breath sounds: Normal breath sounds.  Musculoskeletal:        General: Normal range of motion.  Skin:    General: Skin is warm and dry.  Neurological:      General: No focal deficit present.     Mental Status: She is alert and oriented to person, place, and time. Mental status is at baseline.  Psychiatric:        Mood and Affect: Mood normal.        Behavior: Behavior normal.        Thought Content: Thought content normal.        Judgment: Judgment normal.    Lab Results  Component Value Date   HGBA1C 5.5 07/21/2023   HGBA1C 5.7 01/21/2023   HGBA1C 5.7 06/03/2022    Lab Results  Component Value Date   CREATININE 0.71 07/21/2023   CREATININE 0.81 01/21/2023   CREATININE 0.72 06/03/2022    Lab Results  Component Value Date   WBC 5.2 07/21/2023   HGB 13.0 07/21/2023   HCT 39.2 07/21/2023   PLT 323.0 07/21/2023   GLUCOSE 83 07/21/2023   CHOL 212 (H) 07/21/2023   TRIG 142.0 07/21/2023   HDL 70.80 07/21/2023   LDLDIRECT 124.0 07/21/2023   LDLCALC 113 (H) 07/21/2023   ALT 20 07/21/2023   AST 27 07/21/2023   NA 139 07/21/2023   K 4.7 07/21/2023   CL 102 07/21/2023   CREATININE 0.71 07/21/2023   BUN 16 07/21/2023   CO2 29 07/21/2023   TSH 2.41 07/21/2023   HGBA1C 5.5 07/21/2023    MM 3D SCREENING MAMMOGRAM BILATERAL BREAST Result Date: 08/15/2023 CLINICAL DATA:  Screening. EXAM: DIGITAL SCREENING BILATERAL MAMMOGRAM WITH TOMOSYNTHESIS AND CAD TECHNIQUE: Bilateral screening digital craniocaudal and mediolateral oblique mammograms were obtained. Bilateral screening digital breast tomosynthesis was performed. The images were evaluated with computer-aided detection. COMPARISON:  Previous exam(s). ACR Breast Density Category b: There are scattered areas of fibroglandular density. FINDINGS: There are no findings suspicious for malignancy. IMPRESSION: No mammographic evidence of malignancy. A result letter of this screening mammogram will be mailed directly to the patient. RECOMMENDATION: Screening mammogram in one year. (Code:SM-B-01Y) BI-RADS CATEGORY  1: Negative. Electronically Signed   By: Dina  Arceo M.D.   On: 08/15/2023 12:43     Assessment & Plan:  .Non-seasonal allergic rhinitis due to pollen Assessment & Plan: Continue Flonase ,,  add azelastine    Orders: -     MR LUMBAR SPINE W WO CONTRAST; Future  Moderate mixed hyperlipidemia not requiring statin therapy -     Lipid panel; Future -     LDL cholesterol, direct; Future  Other fatigue -     TSH; Future  Impaired fasting glucose -     Comprehensive metabolic panel with GFR; Future  Lumbar radiculopathy, right Assessment & Plan: Plain films suggest L5-S1 disk herniation with foraminal stenosis. And her pain was not improved with I/A  hip injection.  MRI lumbar spine ordered.  Patient advised to increase Aleve and tylenol to maximal doses  ; if not controlled will add Tramadol and gabapentin.    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  She requires palpable sign language for communication    Overweight (BMI 25.0-29.9) Assessment & Plan: She has had an intentional weight loss with portion reduction and limitation of starches.    Other orders -     Azelastine  HCl; Place 1 spray into both nostrils 2 (two) times daily. Use in each nostril as directed  Dispense: 30 mL; Refill: 12    I personally spent a total of 30 minutes in the care of the patient today including preparing to see the patient, getting/reviewing separately obtained history, performing a medically appropriate exam/evaluation, counseling and educating, placing orders, and independently interpreting results.    Follow-up: Return in about 3 months (around 07/22/2024) for BACK PAIN .   Verneita LITTIE Kettering, MD "

## 2024-04-23 NOTE — Assessment & Plan Note (Signed)
 Aggravated by walking,  With numbness in the lateral thigh reported. Plain films of hip  were ordered at previous visit and noted

## 2024-04-25 NOTE — Assessment & Plan Note (Signed)
 She has had an intentional weight loss with portion reduction and limitation of starches.

## 2024-04-25 NOTE — Assessment & Plan Note (Signed)
 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  She requires palpable sign language for communication

## 2024-04-25 NOTE — Assessment & Plan Note (Signed)
 Continue Flonase ,,  add azelastine 

## 2024-05-19 ENCOUNTER — Ambulatory Visit
Admission: RE | Admit: 2024-05-19 | Discharge: 2024-05-19 | Disposition: A | Discharge status: Home / Self Care | Source: Ambulatory Visit | Attending: Internal Medicine | Admitting: Internal Medicine

## 2024-05-19 DIAGNOSIS — J301 Allergic rhinitis due to pollen: Secondary | ICD-10-CM | POA: Insufficient documentation

## 2024-05-19 MED ORDER — GADOBUTROL 1 MMOL/ML IV SOLN
7.0000 mL | Freq: Once | INTRAVENOUS | Status: AC | PRN
  Administered 2024-05-19: 7 mL via INTRAVENOUS

## 2024-05-19 NOTE — Progress Notes (Signed)
 Helen Mccoy was accompanied by her mother and POA. No onsite tactile interpreter was available or needed with her Mother stepping in for the MRI Screening/Clearance process. Patient was screened with Mother who preformed Tactile Sign language. Exam was explained to mother who then relayed all instructions to her daughter the Patient. Exam went well. Exam was completed no issues during her scan.

## 2024-05-20 ENCOUNTER — Ambulatory Visit: Payer: Self-pay | Admitting: Internal Medicine

## 2024-05-20 DIAGNOSIS — M5416 Radiculopathy, lumbar region: Secondary | ICD-10-CM

## 2024-05-22 NOTE — Assessment & Plan Note (Signed)
 Plain films suggest L5-S1 disk herniation with foraminal stenosis. And her pain was not improved with I/A hip injection.  MRI lumbar spine suggests impingement of L5 nerve root on the left.  Neurosurgical consultation ordered and accepted.  Referral to Dr Clois in process.

## 2024-06-28 ENCOUNTER — Ambulatory Visit: Admitting: Podiatry

## 2024-07-12 ENCOUNTER — Ambulatory Visit: Admitting: Orthopedic Surgery

## 2024-07-22 ENCOUNTER — Ambulatory Visit: Admitting: Internal Medicine
